# Patient Record
Sex: Female | Born: 1990 | Race: Black or African American | Hispanic: No | Marital: Single | Smoking: Current every day smoker
Health system: Southern US, Community
[De-identification: ages and names within clinical notes are randomized; demographics above are authoritative.]

## PROBLEM LIST (undated history)

## (undated) DIAGNOSIS — F1010 Alcohol abuse, uncomplicated: Secondary | ICD-10-CM

## (undated) DIAGNOSIS — F329 Major depressive disorder, single episode, unspecified: Secondary | ICD-10-CM

## (undated) DIAGNOSIS — F32A Depression, unspecified: Secondary | ICD-10-CM

## (undated) DIAGNOSIS — F1910 Other psychoactive substance abuse, uncomplicated: Secondary | ICD-10-CM

## (undated) DIAGNOSIS — K7290 Hepatic failure, unspecified without coma: Secondary | ICD-10-CM

## (undated) DIAGNOSIS — F319 Bipolar disorder, unspecified: Secondary | ICD-10-CM

## (undated) DIAGNOSIS — Z789 Other specified health status: Secondary | ICD-10-CM

## (undated) DIAGNOSIS — F1020 Alcohol dependence, uncomplicated: Secondary | ICD-10-CM

## (undated) HISTORY — DX: Other psychoactive substance abuse, uncomplicated: F19.10

## (undated) HISTORY — PX: NO PAST SURGERIES: SHX2092

## (undated) HISTORY — DX: Bipolar disorder, unspecified: F31.9

## (undated) HISTORY — DX: Hepatic failure, unspecified without coma: K72.90

## (undated) HISTORY — DX: Alcohol dependence, uncomplicated: F10.20

---
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
## 2010-11-28 LAB — URINALYSIS, ROUTINE W REFLEX MICROSCOPIC
Bilirubin Urine: NEGATIVE
Specific Gravity, Urine: 1.025 (ref 1.005–1.030)
Urobilinogen, UA: 0.2 mg/dL (ref 0.0–1.0)
pH: 7 (ref 5.0–8.0)

## 2010-11-28 LAB — URINE MICROSCOPIC-ADD ON

## 2010-12-04 LAB — RAPID URINE DRUG SCREEN, HOSP PERFORMED
Amphetamines: NOT DETECTED
Opiates: NOT DETECTED

## 2010-12-04 LAB — CBC
MCHC: 33.8 g/dL (ref 30.0–36.0)
MCV: 88 fL (ref 78.0–100.0)
Platelets: 217 10*3/uL (ref 150–400)
RBC: 4 MIL/uL (ref 3.87–5.11)
WBC: 12.5 10*3/uL — ABNORMAL HIGH (ref 4.0–10.5)

## 2010-12-04 LAB — RPR: RPR Ser Ql: NONREACTIVE

## 2011-02-10 ENCOUNTER — Emergency Department (HOSPITAL_COMMUNITY)
Admission: EM | Admit: 2011-02-10 | Discharge: 2011-02-10 | Disposition: A | Discharge status: Home / Self Care | Payer: Medicaid Other | Attending: Emergency Medicine | Admitting: Emergency Medicine

## 2011-02-10 DIAGNOSIS — F329 Major depressive disorder, single episode, unspecified: Secondary | ICD-10-CM | POA: Insufficient documentation

## 2011-02-10 DIAGNOSIS — R51 Headache: Secondary | ICD-10-CM | POA: Insufficient documentation

## 2011-02-10 DIAGNOSIS — F3289 Other specified depressive episodes: Secondary | ICD-10-CM | POA: Insufficient documentation

## 2011-02-10 DIAGNOSIS — H9209 Otalgia, unspecified ear: Secondary | ICD-10-CM | POA: Insufficient documentation

## 2011-02-10 LAB — POCT PREGNANCY, URINE: Preg Test, Ur: NEGATIVE

## 2011-03-05 ENCOUNTER — Inpatient Hospital Stay (INDEPENDENT_AMBULATORY_CARE_PROVIDER_SITE_OTHER)
Admission: RE | Admit: 2011-03-05 | Discharge: 2011-03-05 | Disposition: A | Discharge status: Home / Self Care | Payer: Medicaid Other | Source: Ambulatory Visit | Attending: Family Medicine | Admitting: Family Medicine

## 2011-03-05 DIAGNOSIS — N760 Acute vaginitis: Secondary | ICD-10-CM

## 2011-03-05 LAB — WET PREP, GENITAL

## 2011-03-05 LAB — POCT URINALYSIS DIP (DEVICE)
Glucose, UA: NEGATIVE mg/dL
Hgb urine dipstick: NEGATIVE
Ketones, ur: NEGATIVE mg/dL
Specific Gravity, Urine: >=1.03 (ref 1.005–1.030)
Urobilinogen, UA: 0.2 mg/dL (ref 0.0–1.0)

## 2011-03-05 LAB — POCT PREGNANCY, URINE: Preg Test, Ur: NEGATIVE

## 2011-03-06 LAB — GC/CHLAMYDIA PROBE AMP, GENITAL: GC Probe Amp, Genital: NEGATIVE

## 2011-04-29 ENCOUNTER — Emergency Department (HOSPITAL_COMMUNITY)
Admission: EM | Admit: 2011-04-29 | Discharge: 2011-04-29 | Disposition: A | Discharge status: Home / Self Care | Payer: Medicaid Other | Attending: Emergency Medicine | Admitting: Emergency Medicine

## 2011-04-29 DIAGNOSIS — J3489 Other specified disorders of nose and nasal sinuses: Secondary | ICD-10-CM | POA: Insufficient documentation

## 2011-04-29 DIAGNOSIS — R51 Headache: Secondary | ICD-10-CM | POA: Insufficient documentation

## 2011-04-29 DIAGNOSIS — J329 Chronic sinusitis, unspecified: Secondary | ICD-10-CM | POA: Insufficient documentation

## 2011-04-29 DIAGNOSIS — H9209 Otalgia, unspecified ear: Secondary | ICD-10-CM | POA: Insufficient documentation

## 2011-06-23 ENCOUNTER — Inpatient Hospital Stay (INDEPENDENT_AMBULATORY_CARE_PROVIDER_SITE_OTHER)
Admission: RE | Admit: 2011-06-23 | Discharge: 2011-06-23 | Disposition: A | Discharge status: Home / Self Care | Payer: Medicaid Other | Source: Ambulatory Visit | Attending: Family Medicine | Admitting: Family Medicine

## 2011-06-23 DIAGNOSIS — Z331 Pregnant state, incidental: Secondary | ICD-10-CM

## 2011-06-23 LAB — POCT URINALYSIS DIP (DEVICE)
Leukocytes, UA: NEGATIVE
Nitrite: NEGATIVE
Protein, ur: NEGATIVE mg/dL
Urobilinogen, UA: 0.2 mg/dL (ref 0.0–1.0)
pH: 7 (ref 5.0–8.0)

## 2011-07-16 ENCOUNTER — Inpatient Hospital Stay (HOSPITAL_COMMUNITY): Payer: Medicaid Other

## 2011-07-16 ENCOUNTER — Inpatient Hospital Stay (HOSPITAL_COMMUNITY)
Admission: AD | Admit: 2011-07-16 | Discharge: 2011-07-16 | Disposition: A | Discharge status: Home / Self Care | Payer: Medicaid Other | Source: Ambulatory Visit | Attending: Obstetrics & Gynecology | Admitting: Obstetrics & Gynecology

## 2011-07-16 ENCOUNTER — Encounter (HOSPITAL_COMMUNITY): Payer: Self-pay

## 2011-07-16 DIAGNOSIS — N939 Abnormal uterine and vaginal bleeding, unspecified: Secondary | ICD-10-CM

## 2011-07-16 DIAGNOSIS — N938 Other specified abnormal uterine and vaginal bleeding: Secondary | ICD-10-CM | POA: Insufficient documentation

## 2011-07-16 DIAGNOSIS — N949 Unspecified condition associated with female genital organs and menstrual cycle: Secondary | ICD-10-CM | POA: Insufficient documentation

## 2011-07-16 DIAGNOSIS — N898 Other specified noninflammatory disorders of vagina: Secondary | ICD-10-CM

## 2011-07-16 DIAGNOSIS — N925 Other specified irregular menstruation: Secondary | ICD-10-CM | POA: Insufficient documentation

## 2011-07-16 HISTORY — DX: Other specified health status: Z78.9

## 2011-07-16 LAB — WET PREP, GENITAL: Clue Cells Wet Prep HPF POC: NONE SEEN

## 2011-07-16 LAB — CBC
MCH: 22.3 pg — ABNORMAL LOW (ref 26.0–34.0)
MCHC: 30.7 g/dL (ref 30.0–36.0)
MCV: 72.7 fL — ABNORMAL LOW (ref 78.0–100.0)
Platelets: 403 10*3/uL — ABNORMAL HIGH (ref 150–400)
RBC: 4.39 MIL/uL (ref 3.87–5.11)
RDW: 19.2 % — ABNORMAL HIGH (ref 11.5–15.5)

## 2011-07-16 LAB — HCG, QUANTITATIVE, PREGNANCY: hCG, Beta Chain, Quant, S: 1 m[IU]/mL (ref <5)

## 2011-07-16 LAB — ABO/RH: ABO/RH(D): B POS

## 2011-07-16 MED ORDER — MEDROXYPROGESTERONE ACETATE 10 MG PO TABS: 10.0000 mg | ORAL_TABLET | Freq: Every day | ORAL | Status: DC

## 2011-07-16 NOTE — ED Provider Notes (Signed)
History   Pt presents today c/o vag bleeding "on and off" over the past several months. She states she had a pos preg test at urgent care about 10 days ago. She denies abd pain, vag irritation, or any other sx at this time. She is a very poor historian and states she thinks she also had a pos home preg test about 4 months ago.  Chief Complaint  Patient presents with  . Vaginal Bleeding   HPI  OB History    Grav Para Term Preterm Abortions TAB SAB Ect Mult Living   2 1 1  0 0 0 0 0 0 1      Past Medical History  Diagnosis Date  . No pertinent past medical history     Past Surgical History  Procedure Date  . No past surgeries     History reviewed. No pertinent family history.  History  Substance Use Topics  . Smoking status: Current Some Day Smoker  . Smokeless tobacco: Not on file  . Alcohol Use: No    Allergies: No Known Allergies  No prescriptions prior to admission    Review of Systems  Constitutional: Negative for fever.  Cardiovascular: Negative for chest pain.  Gastrointestinal: Negative for nausea, vomiting, abdominal pain, diarrhea and constipation.  Genitourinary: Negative for dysuria, urgency, frequency and hematuria.  Neurological: Negative for dizziness and headaches.  Psychiatric/Behavioral: Negative for depression and suicidal ideas.   Physical Exam   Blood pressure 124/69, pulse 67, temperature 98.8 F (37.1 C), temperature source Oral, resp. rate 20, height 5\' 6"  (1.676 m), weight 154 lb (69.854 kg), last menstrual period 06/10/2011.  Physical Exam  Nursing note and vitals reviewed. Constitutional: She appears well-developed and well-nourished. No distress.  HENT:  Head: Normocephalic and atraumatic.  Eyes: EOM are normal. Pupils are equal, round, and reactive to light.  GI: Soft. She exhibits no distension. There is no tenderness. There is no rebound and no guarding.  Skin: She is not diaphoretic.    MAU Course  Procedures  Results for  orders placed during the hospital encounter of 07/16/11 (from the past 24 hour(s))  CBC     Status: Abnormal   Collection Time   07/16/11  2:50 PM      Component Value Range   WBC 7.5  4.0 - 10.5 (K/uL)   RBC 4.39  3.87 - 5.11 (MIL/uL)   Hemoglobin 9.8 (*) 12.0 - 15.0 (g/dL)   HCT 16.1 (*) 09.6 - 46.0 (%)   MCV 72.7 (*) 78.0 - 100.0 (fL)   MCH 22.3 (*) 26.0 - 34.0 (pg)   MCHC 30.7  30.0 - 36.0 (g/dL)   RDW 04.5 (*) 40.9 - 15.5 (%)   Platelets 403 (*) 150 - 400 (K/uL)  ABO/RH     Status: Normal   Collection Time   07/16/11  2:50 PM      Component Value Range   ABO/RH(D) B POS    HCG, QUANTITATIVE, PREGNANCY     Status: Normal   Collection Time   07/16/11  2:50 PM      Component Value Range   hCG, Beta Chain, Quant, S 1  <5 (mIU/mL)   US Transvaginal Non-ob  07/16/2011  *RADIOLOGY REPORT*  Clinical Data: Bleeding, positive urine pregnancy test on 06/23/2011; current quantitative beta HCG = 1 (on 07/16/2011)  TRANSABDOMINAL AND TRANSVAGINAL ULTRASOUND OF PELVIS Technique:  Both transabdominal and transvaginal ultrasound examinations of the pelvis were performed. Transabdominal technique was performed for global imaging of the  pelvis including uterus, ovaries, adnexal regions, and pelvic cul-de-sac.  Comparison: None   It was necessary to proceed with endovaginal exam following the transabdominal exam to visualize the endometrium and ovaries.  Findings:  Uterus: 6.1 cm length by 1.8 cm AP by 4.3 cm transverse.  No focal mass.  Endometrium: 5 mm thick, normal.  No endometrial fluid or gestational sac identified.  Right ovary:  3.3 x 1.8 x 2.5 cm.  Small follicle cyst 2.1 cm greatest size.  Left ovary: 2.4 x 1.5 x 2.0 cm.  Normal morphology without mass.  Other findings: Small amount of nonspecific free pelvic fluid.  No adnexal masses otherwise identified.  IMPRESSION:  Small amount of nonspecific free pelvic fluid. Otherwise negative exam.  Original Report Authenticated By: Lollie Marrow, M.D.    US Pelvis Complete  07/16/2011  *RADIOLOGY REPORT*  Clinical Data: Bleeding, positive urine pregnancy test on 06/23/2011; current quantitative beta HCG = 1 (on 07/16/2011)  TRANSABDOMINAL AND TRANSVAGINAL ULTRASOUND OF PELVIS Technique:  Both transabdominal and transvaginal ultrasound examinations of the pelvis were performed. Transabdominal technique was performed for global imaging of the pelvis including uterus, ovaries, adnexal regions, and pelvic cul-de-sac.  Comparison: None   It was necessary to proceed with endovaginal exam following the transabdominal exam to visualize the endometrium and ovaries.  Findings:  Uterus: 6.1 cm length by 1.8 cm AP by 4.3 cm transverse.  No focal mass.  Endometrium: 5 mm thick, normal.  No endometrial fluid or gestational sac identified.  Right ovary:  3.3 x 1.8 x 2.5 cm.  Small follicle cyst 2.1 cm greatest size.  Left ovary: 2.4 x 1.5 x 2.0 cm.  Normal morphology without mass.  Other findings: Small amount of nonspecific free pelvic fluid.  No adnexal masses otherwise identified.  IMPRESSION:  Small amount of nonspecific free pelvic fluid. Otherwise negative exam.  Original Report Authenticated By: Lollie Marrow, M.D.   Wet prep done. Few WBCs noted, otherwise negative.   Assessment and Plan  Irregular vag bleeding: pt is not pregnant. Difficult to determine if she was ever really pregnant or had an early SAB. Will give Rx for provera and have her f/u with her PCP. Discussed diet, activity, risks, and precautions.  Clinton Gallant. Charolett Yarrow III, DrHSc, MPAS, PA-C  07/16/2011, 2:47 PM   Henrietta Hoover, PA 07/16/11 1702

## 2011-07-16 NOTE — Progress Notes (Signed)
Bleeding started was off and on, went to urgent care about 10 days because of bleeding, was told she was preg.

## 2011-07-17 LAB — GC/CHLAMYDIA PROBE AMP, GENITAL: Chlamydia, DNA Probe: NEGATIVE

## 2011-07-31 ENCOUNTER — Encounter (HOSPITAL_COMMUNITY): Payer: Self-pay | Admitting: *Deleted

## 2011-07-31 ENCOUNTER — Emergency Department (HOSPITAL_COMMUNITY)
Admission: EM | Admit: 2011-07-31 | Discharge: 2011-07-31 | Discharge status: Against Medical Advice | Payer: Medicaid Other | Attending: Emergency Medicine | Admitting: Emergency Medicine

## 2011-07-31 DIAGNOSIS — Z0389 Encounter for observation for other suspected diseases and conditions ruled out: Secondary | ICD-10-CM | POA: Insufficient documentation
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
## 2011-07-31 NOTE — ED Notes (Signed)
Name called twice no answer 

## 2011-10-23 ENCOUNTER — Encounter (HOSPITAL_COMMUNITY): Payer: Self-pay | Admitting: *Deleted

## 2011-10-23 ENCOUNTER — Emergency Department (INDEPENDENT_AMBULATORY_CARE_PROVIDER_SITE_OTHER)
Admission: EM | Admit: 2011-10-23 | Discharge: 2011-10-23 | Disposition: A | Discharge status: Home / Self Care | Payer: Medicaid Other | Source: Home / Self Care | Attending: Family Medicine | Admitting: Family Medicine

## 2011-10-23 DIAGNOSIS — Z202 Contact with and (suspected) exposure to infections with a predominantly sexual mode of transmission: Secondary | ICD-10-CM

## 2011-10-23 DIAGNOSIS — Z9189 Other specified personal risk factors, not elsewhere classified: Secondary | ICD-10-CM

## 2011-10-23 LAB — POCT URINALYSIS DIP (DEVICE)
Protein, ur: NEGATIVE mg/dL
Specific Gravity, Urine: 1.015 (ref 1.005–1.030)
Urobilinogen, UA: 0.2 mg/dL (ref 0.0–1.0)
pH: 7 (ref 5.0–8.0)

## 2011-10-23 MED ORDER — METRONIDAZOLE 500 MG PO TABS: 500.0000 mg | ORAL_TABLET | Freq: Two times a day (BID) | ORAL | Status: AC
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
## 2011-10-23 NOTE — Discharge Instructions (Signed)
No intercourse x 10 days. We will contact you with any positive lab results and instructions if indicated.
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
## 2011-10-25 LAB — GC/CHLAMYDIA PROBE AMP, GENITAL: GC Probe Amp, Genital: NEGATIVE

## 2011-10-27 ENCOUNTER — Telehealth (HOSPITAL_COMMUNITY): Payer: Self-pay | Admitting: *Deleted

## 2011-10-27 MED ORDER — AZITHROMYCIN 500 MG PO TABS: 1000.0000 mg | ORAL_TABLET | Freq: Once | ORAL | Status: AC

## 2011-10-27 NOTE — ED Notes (Signed)
GC neg., Chlamydia pos., no wet prep: Pt. Needs tx. with Zithromax 1 gm po x 1 dose. Dr. Artis Flock e-prescribed it to [facility] on [address]. I called and left pt. a message to call.  Gina Hooper 10/27/2011
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
## 2012-02-17 ENCOUNTER — Encounter (HOSPITAL_COMMUNITY): Payer: Self-pay | Admitting: *Deleted

## 2012-02-17 ENCOUNTER — Emergency Department (HOSPITAL_COMMUNITY)
Admission: EM | Admit: 2012-02-17 | Discharge: 2012-02-17 | Disposition: A | Discharge status: Home / Self Care | Payer: Medicaid Other | Attending: Emergency Medicine | Admitting: Emergency Medicine

## 2012-02-17 ENCOUNTER — Emergency Department (HOSPITAL_COMMUNITY): Payer: Medicaid Other

## 2012-02-17 DIAGNOSIS — W503XXA Accidental bite by another person, initial encounter: Secondary | ICD-10-CM

## 2012-02-17 DIAGNOSIS — S0003XA Contusion of scalp, initial encounter: Secondary | ICD-10-CM | POA: Insufficient documentation

## 2012-02-17 DIAGNOSIS — S0083XA Contusion of other part of head, initial encounter: Secondary | ICD-10-CM | POA: Insufficient documentation

## 2012-02-17 DIAGNOSIS — S51809A Unspecified open wound of unspecified forearm, initial encounter: Secondary | ICD-10-CM | POA: Insufficient documentation

## 2012-02-17 DIAGNOSIS — R51 Headache: Secondary | ICD-10-CM | POA: Insufficient documentation

## 2012-02-17 MED ORDER — TETANUS-DIPHTH-ACELL PERTUSSIS 5-2.5-18.5 LF-MCG/0.5 IM SUSP
0.5000 mL | Freq: Once | INTRAMUSCULAR | Status: AC
  Administered 2012-02-17: 0.5 mL via INTRAMUSCULAR
  Filled 2012-02-17: qty 0.5

## 2012-02-17 MED ORDER — AMOXICILLIN-POT CLAVULANATE 875-125 MG PO TABS: 1.0000 | ORAL_TABLET | Freq: Two times a day (BID) | ORAL | Status: AC
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
## 2012-02-17 NOTE — ED Provider Notes (Signed)
Medical screening examination/treatment/procedure(s) were performed by non-physician practitioner and as supervising physician I was immediately available for consultation/collaboration.  Olivia Mackie, MD 02/17/12 [phone]
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
## 2012-02-17 NOTE — ED Notes (Signed)
Contacted CT Scan, states PACS went down and radiologist is working on reading results. Results will show shortly.

## 2012-02-18 NOTE — ED Provider Notes (Signed)
Medical screening examination/treatment/procedure(s) were performed by non-physician practitioner and as supervising physician I was immediately available for consultation/collaboration.  Olivia Mackie, MD 02/18/12 [phone]

## 2012-06-17 IMAGING — US US TRANSVAGINAL NON-OB
1 series · 14 of 25 positions shown · non-contrast
Comparison: None

CLINICAL DATA: Bleeding, positive urine pregnancy test on
06/23/2011; current quantitative beta HCG = 1 (on 07/16/2011)

TRANSABDOMINAL AND TRANSVAGINAL ULTRASOUND OF PELVIS
TECHNIQUE: Both transabdominal and transvaginal ultrasound
examinations of the pelvis were performed. Transabdominal technique
was performed for global imaging of the pelvis including uterus,
ovaries, adnexal regions, and pelvic cul-de-sac.

[Series 1: us ob comp less 14 wks · 14 of 75 slices shown]
[im 1/75]
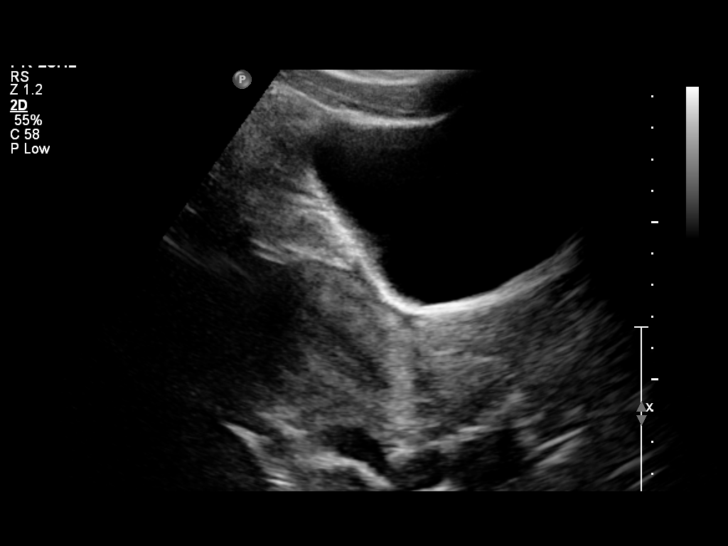
[im 7/75]
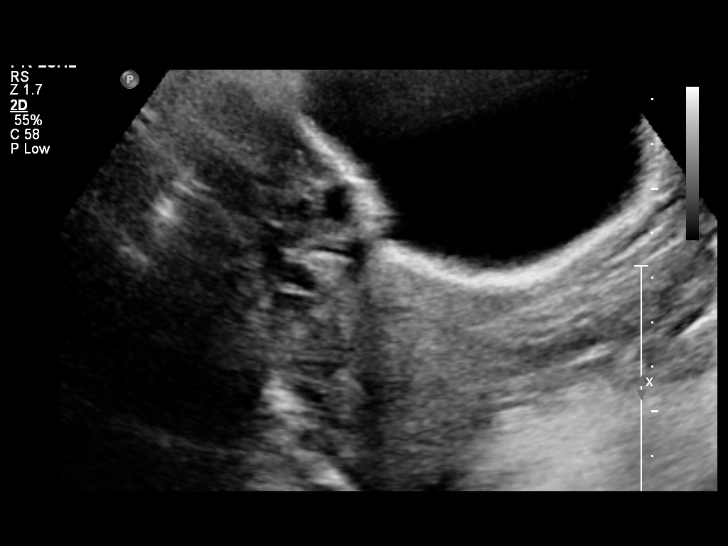
[im 13/75]
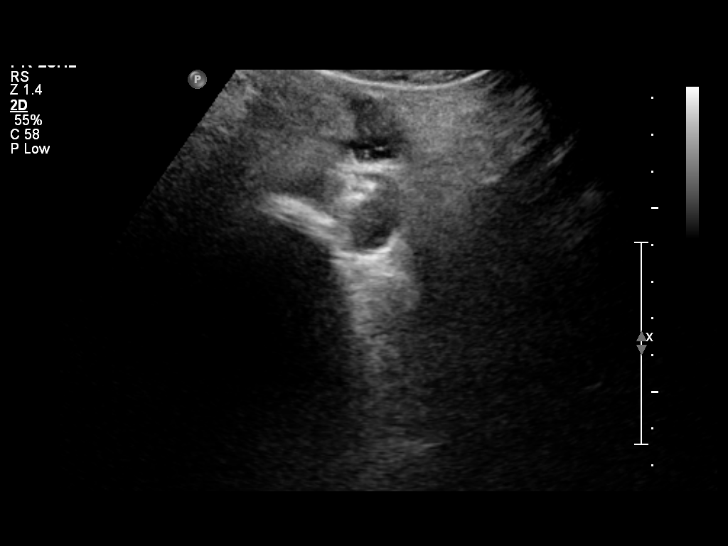
[im 19/75]
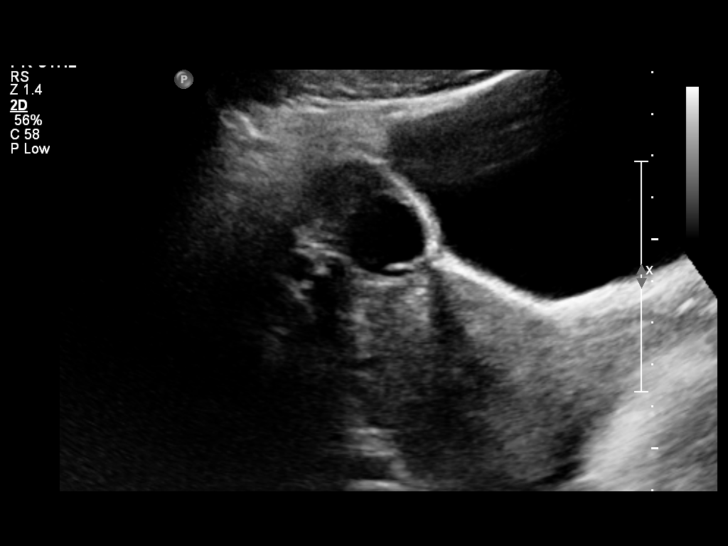
[im 25/75]
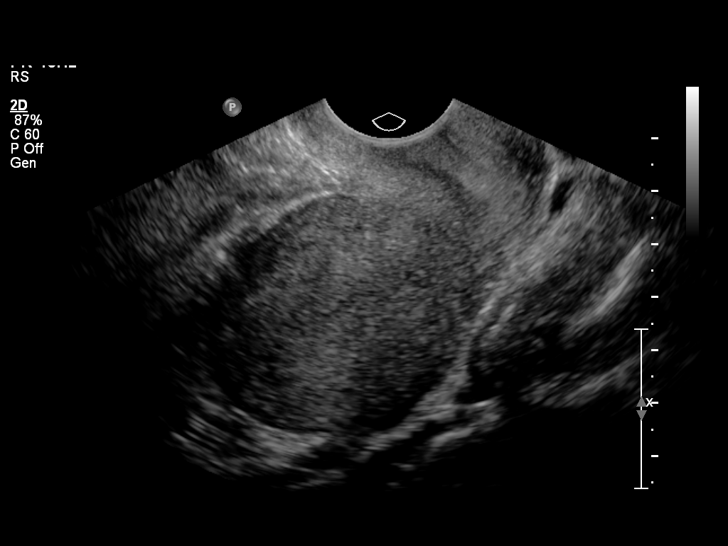
[im 28/75]
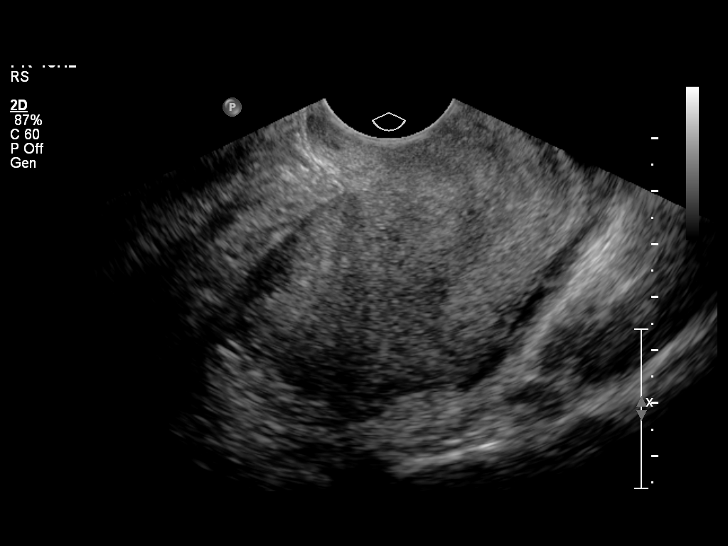
[im 34/75]
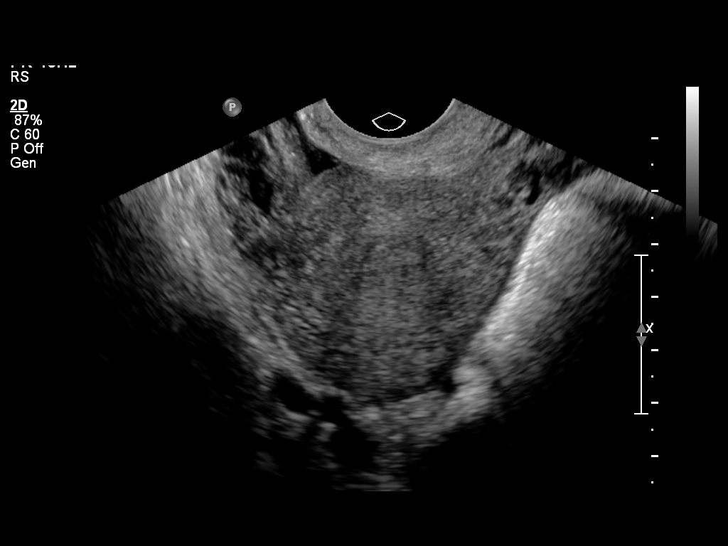
[im 41/75]
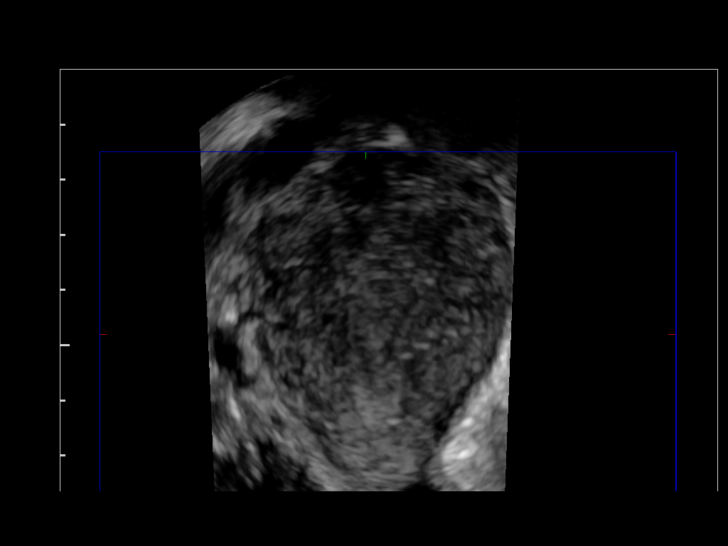
[im 47/75]
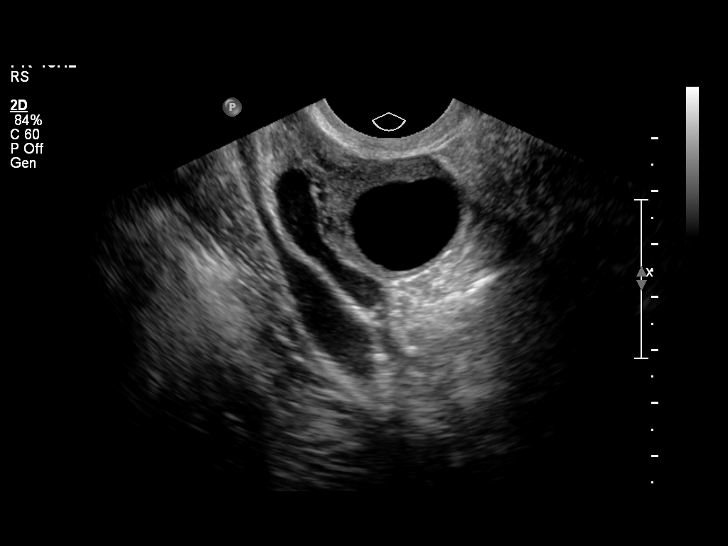
[im 50/75]
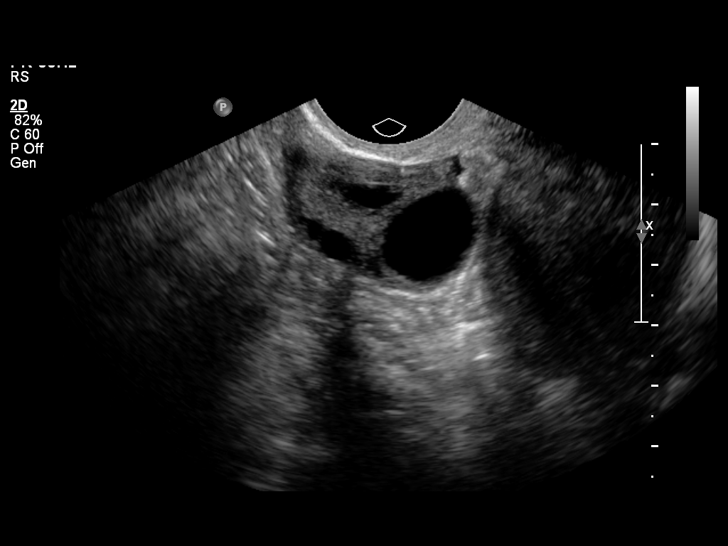
[im 56/75]
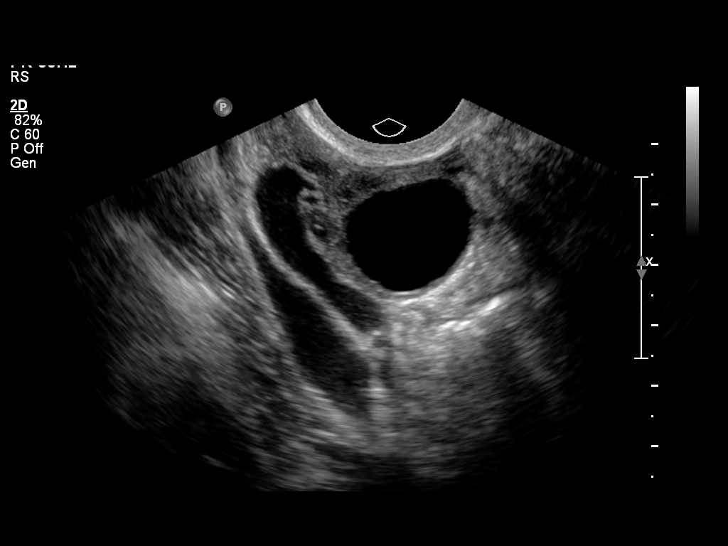
[im 62/75]
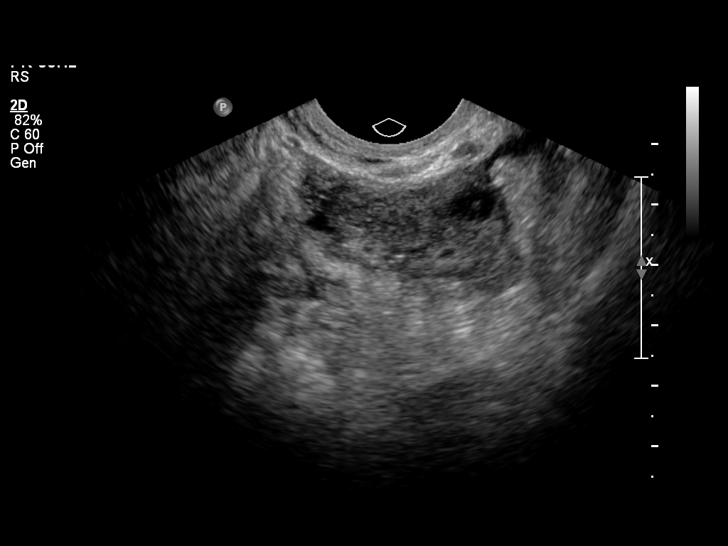
[im 68/75]
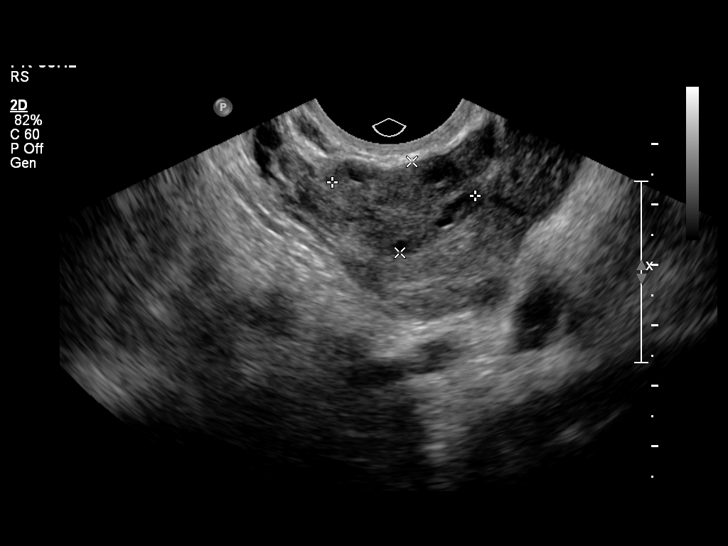
[im 75/75]
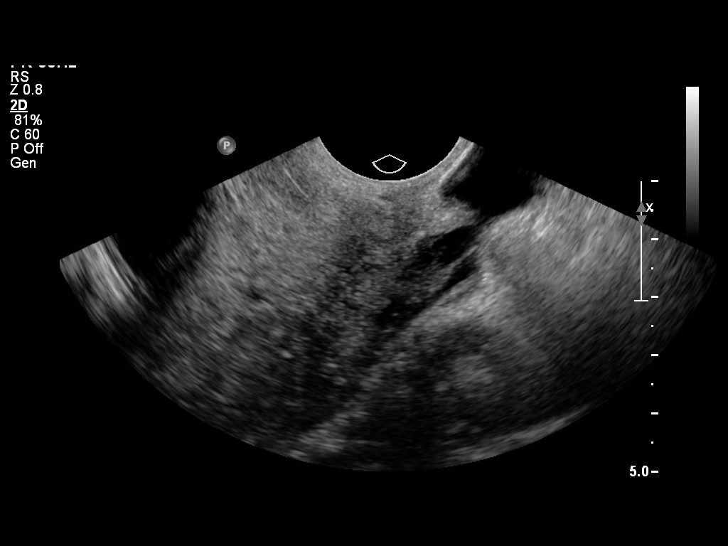

[14 of 25 positions shown; findings below may reference images not displayed]

It was necessary to proceed with endovaginal exam following the
transabdominal exam to visualize the endometrium and ovaries.
FINDINGS: Uterus: 6.1 cm length by 1.8 cm AP by 4.3 cm transverse.  No focal
mass.

Endometrium: 5 mm thick, normal.  No endometrial fluid or
gestational sac identified.

Right ovary:  3.3 x 1.8 x 2.5 cm.  Small follicle cyst 2.1 cm
greatest size.

Left ovary: 2.4 x 1.5 x 2.0 cm.  Normal morphology without mass.

Other findings: Small amount of nonspecific free pelvic fluid.  No
adnexal masses otherwise identified.
IMPRESSION: Small amount of nonspecific free pelvic fluid.
Otherwise negative exam.

## 2013-05-22 ENCOUNTER — Emergency Department (HOSPITAL_COMMUNITY): Payer: Medicaid Other

## 2013-05-22 ENCOUNTER — Encounter (HOSPITAL_COMMUNITY): Payer: Self-pay | Admitting: *Deleted

## 2013-05-22 ENCOUNTER — Telehealth (HOSPITAL_COMMUNITY): Payer: Self-pay | Admitting: *Deleted

## 2013-05-22 ENCOUNTER — Emergency Department (HOSPITAL_COMMUNITY)
Admission: EM | Admit: 2013-05-22 | Discharge: 2013-05-22 | Disposition: A | Discharge status: Home / Self Care | Payer: Medicaid Other | Attending: Emergency Medicine | Admitting: Emergency Medicine

## 2013-05-22 DIAGNOSIS — Y939 Activity, unspecified: Secondary | ICD-10-CM | POA: Insufficient documentation

## 2013-05-22 DIAGNOSIS — F17200 Nicotine dependence, unspecified, uncomplicated: Secondary | ICD-10-CM | POA: Insufficient documentation

## 2013-05-22 DIAGNOSIS — IMO0002 Reserved for concepts with insufficient information to code with codable children: Secondary | ICD-10-CM | POA: Insufficient documentation

## 2013-05-22 DIAGNOSIS — S8990XA Unspecified injury of unspecified lower leg, initial encounter: Secondary | ICD-10-CM | POA: Insufficient documentation

## 2013-05-22 DIAGNOSIS — Y92410 Unspecified street and highway as the place of occurrence of the external cause: Secondary | ICD-10-CM | POA: Insufficient documentation

## 2013-05-22 DIAGNOSIS — M79672 Pain in left foot: Secondary | ICD-10-CM

## 2013-05-22 DIAGNOSIS — Z8679 Personal history of other diseases of the circulatory system: Secondary | ICD-10-CM | POA: Insufficient documentation
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
## 2013-06-16 ENCOUNTER — Inpatient Hospital Stay (HOSPITAL_COMMUNITY)
Admission: AD | Admit: 2013-06-16 | Discharge: 2013-06-16 | Disposition: A | Discharge status: Home / Self Care | Payer: Medicaid Other | Source: Ambulatory Visit | Attending: Obstetrics & Gynecology | Admitting: Obstetrics & Gynecology

## 2013-06-16 ENCOUNTER — Encounter (HOSPITAL_COMMUNITY): Payer: Self-pay | Admitting: *Deleted

## 2013-06-16 DIAGNOSIS — F17200 Nicotine dependence, unspecified, uncomplicated: Secondary | ICD-10-CM | POA: Insufficient documentation

## 2013-06-16 DIAGNOSIS — N938 Other specified abnormal uterine and vaginal bleeding: Secondary | ICD-10-CM

## 2013-06-16 DIAGNOSIS — N949 Unspecified condition associated with female genital organs and menstrual cycle: Secondary | ICD-10-CM

## 2013-06-16 DIAGNOSIS — N925 Other specified irregular menstruation: Secondary | ICD-10-CM

## 2013-06-16 DIAGNOSIS — N926 Irregular menstruation, unspecified: Secondary | ICD-10-CM | POA: Insufficient documentation

## 2013-06-16 MED ORDER — MEDROXYPROGESTERONE ACETATE 10 MG PO TABS: 10.0000 mg | ORAL_TABLET | Freq: Every day | ORAL | Status: DC

## 2013-06-16 NOTE — MAU Note (Signed)
3 periods in one month.  Only time of irregular bleeding was a miscarriage.

## 2013-06-16 NOTE — MAU Provider Note (Signed)
History     CSN: [id number]  Arrival date and time: 06/16/13 7829   None     Chief Complaint  Patient presents with  . Vaginal Bleeding   HPI  Gina Hooper is a 22y/o G2P1011 who presents today c/o increased periods this month. She has no hx of any gynecologic problems or abnormal pap smears.  She has not had routine care in a few years; she used to go to the [facility] for routine care but stopped going.  She is interested in a referral to the [facility] to establish routine care again. She has NKDA and is not taking any medications other than Advil prn.  She is a current everyday smoker, 0.25 packs per day for 5 years, and uses moderate alcohol. She denies exposures to STIs and does not think she is pregnant, but she is sexually active. She uses condoms for birth control. UPT (-) today.  Her menses are usually regular, every 28d, with moderate flow lasting for 4 days. LMP began on 10/7 and was normal for her, but 2 days after it terminated, she began bleeding again.  She also reports that the bleeding was heavier than normal, but was without clots.  The episode lasted for her typical 4 days and then ceased.  2 days later vaginal bleeding began for the third time.  She is currently on the 3rd day of vaginal bleeding, but she states that it is improving, similar to her previous episode.  With the heavy bleeding she was using 2 tampons per hour, which is abnormal for her.  Currently, she is using 1 tampon every 3-4 hours. Gina Hooper reports that she has never had anything like this before.  Her only other report of increased vaginal bleeding was in 2013 when she had a miscarriage and did not know she was pregnant. Gina Hooper does report mild fatigue and lightheadedness over the past few days, but she denies HA, CP, SOB, N/V, diarrhea, constipation, abdominal pain, dysuria, vaginal discharge other than blood, vaginal burning/itching, dyspareunia, fever or chills. She has had some mild cramping  that she describes as her "normal period cramps" for which she takes Advil prn.   Pertinent Gynecological History: Menses: flow is moderate Bleeding: regular Contraception: condoms DES exposure: unknown Blood transfusions: none Sexually transmitted diseases: no past history   Past Medical History  Diagnosis Date  . No pertinent past medical history   . Migraine     Past Surgical History  Procedure Laterality Date  . No past surgeries      History reviewed. No pertinent family history.  History  Substance Use Topics  . Smoking status: Current Some Day Smoker -- 0.25 packs/day for 5 years    Types: Cigarettes  . Smokeless tobacco: Never Used  . Alcohol Use: Yes    Allergies: No Known Allergies  No prescriptions prior to admission    Review of Systems  Constitutional: Positive for malaise/fatigue. Negative for fever and chills.  Eyes: Negative for blurred vision.  Respiratory: Negative for shortness of breath.   Cardiovascular: Negative for chest pain.  Gastrointestinal: Negative for heartburn, nausea, vomiting, abdominal pain, diarrhea and constipation.  Genitourinary: Negative for dysuria.  Neurological: Positive for dizziness. Negative for headaches.       Lightheadedness    Physical Exam   Blood pressure 123/78, pulse 74, temperature 98.8 F (37.1 C), temperature source Oral, resp. rate 18, height 5' 5.5" (1.664 m), weight 68.04 kg (150 lb), last menstrual period 06/14/2013.  Physical Exam  Constitutional: She is oriented to person, place, and time. She appears well-developed and well-nourished. No distress.  HENT:  Head: Normocephalic.  Eyes: Conjunctivae are normal.  Neck: Neck supple.  Cardiovascular: Normal rate and regular rhythm.  Exam reveals no gallop and no friction rub.   No murmur heard. Respiratory: Effort normal and breath sounds normal. She has no wheezes.  GI: Soft. Bowel sounds are normal. She exhibits no distension and no mass. There is  no tenderness. There is no rebound and no guarding.  Genitourinary: Vaginal discharge found.  Pelvic exam: normal external genitalia, vulva, vagina, cervix, uterus and adnexa, VULVA: normal appearing vulva with no masses, tenderness or lesions, VAGINA: vaginal discharge - small to moderate bloody discharge, CERVIX: normal appearing cervix with bloody discharge, no lesions, UTERUS: uterus is normal size, shape, consistency and nontender, ADNEXA: normal adnexa in size, nontender and no masses.   Neurological: She is alert and oriented to person, place, and time.  Psychiatric: She has a normal mood and affect.    Labs: Results for orders placed during the hospital encounter of 06/16/13 (from the past 24 hour(s))  POCT PREGNANCY, URINE     Status: None   Collection Time    06/16/13  9:29 AM      Result Value Range   Preg Test, Ur NEGATIVE  NEGATIVE    MAU Course  Procedures  MDM Pelvic exam w/ speculum  Offered CBC, patient declines. Will repeat if this becomes a chronic problem.  Assessment and Plan  A: Suspect anovulatory bleeding   P:  Rx: Provera 10mg  x 10 days  Return for worsening sx or bleeding Referral to [facility] to establish routine care.  Contraception: discussed contraceptive options; pt is not actively trying to become pregnant, but declined any prevention.   Christiana Fuchs 06/16/2013, 9:29 AM   Seen and examined by me also Agree with note Aviva Signs, CNM

## 2013-06-16 NOTE — MAU Note (Signed)
Patient presents with complaint that she has "came on my period 3 times this month."

## 2013-06-21 NOTE — MAU Provider Note (Signed)
Attestation of Attending Supervision of Advanced Practitioner (CNM/NP): Evaluation and management procedures were performed by the Advanced Practitioner under my supervision and collaboration. I have reviewed the Advanced Practitioner's note and chart, and I agree with the management and plan.  Mikylah Ackroyd H. 7:07 AM

## 2013-07-28 ENCOUNTER — Encounter: Payer: Medicaid Other | Admitting: Obstetrics & Gynecology

## 2013-08-25 DIAGNOSIS — F1010 Alcohol abuse, uncomplicated: Secondary | ICD-10-CM

## 2013-08-25 HISTORY — DX: Alcohol abuse, uncomplicated: F10.10

## 2013-09-30 ENCOUNTER — Emergency Department (HOSPITAL_COMMUNITY)
Admission: EM | Admit: 2013-09-30 | Discharge: 2013-09-30 | Disposition: A | Discharge status: Home / Self Care | Payer: Medicaid Other | Attending: Emergency Medicine | Admitting: Emergency Medicine

## 2013-09-30 ENCOUNTER — Encounter (HOSPITAL_COMMUNITY): Payer: Self-pay | Admitting: Emergency Medicine

## 2013-09-30 DIAGNOSIS — B86 Scabies: Secondary | ICD-10-CM | POA: Insufficient documentation

## 2013-09-30 DIAGNOSIS — Z79899 Other long term (current) drug therapy: Secondary | ICD-10-CM | POA: Insufficient documentation

## 2013-09-30 DIAGNOSIS — Z8679 Personal history of other diseases of the circulatory system: Secondary | ICD-10-CM | POA: Insufficient documentation

## 2013-09-30 DIAGNOSIS — F17200 Nicotine dependence, unspecified, uncomplicated: Secondary | ICD-10-CM | POA: Insufficient documentation

## 2013-09-30 MED ORDER — LINDANE 1 % EX LOTN: 1.0000 "application " | TOPICAL_LOTION | Freq: Once | CUTANEOUS | Status: DC
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
## 2013-09-30 NOTE — ED Provider Notes (Signed)
Medical screening examination/treatment/procedure(s) were performed by non-physician practitioner and as supervising physician I was immediately available for consultation/collaboration.  EKG Interpretation   None         Dagmar Hait, MD 09/30/13 [phone]
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
## 2013-09-30 NOTE — Discharge Instructions (Signed)
Return here as needed.  Followup with an urgent care. Reapply in 2 weeks. Buy Nix spray for your bed and furniture

## 2014-02-08 ENCOUNTER — Emergency Department (HOSPITAL_COMMUNITY)
Admission: EM | Admit: 2014-02-08 | Discharge: 2014-02-08 | Disposition: A | Discharge status: Home / Self Care | Payer: Medicaid Other | Attending: Emergency Medicine | Admitting: Emergency Medicine

## 2014-02-08 ENCOUNTER — Emergency Department (HOSPITAL_COMMUNITY): Payer: Medicaid Other

## 2014-02-08 ENCOUNTER — Encounter (HOSPITAL_COMMUNITY): Payer: Self-pay | Admitting: Emergency Medicine

## 2014-02-08 DIAGNOSIS — S0501XA Injury of conjunctiva and corneal abrasion without foreign body, right eye, initial encounter: Secondary | ICD-10-CM

## 2014-02-08 DIAGNOSIS — S0011XA Contusion of right eyelid and periocular area, initial encounter: Secondary | ICD-10-CM

## 2014-02-08 DIAGNOSIS — Z8679 Personal history of other diseases of the circulatory system: Secondary | ICD-10-CM | POA: Insufficient documentation

## 2014-02-08 DIAGNOSIS — IMO0002 Reserved for concepts with insufficient information to code with codable children: Secondary | ICD-10-CM | POA: Insufficient documentation

## 2014-02-08 DIAGNOSIS — F17200 Nicotine dependence, unspecified, uncomplicated: Secondary | ICD-10-CM | POA: Insufficient documentation

## 2014-02-08 DIAGNOSIS — S0010XA Contusion of unspecified eyelid and periocular area, initial encounter: Secondary | ICD-10-CM | POA: Insufficient documentation

## 2014-02-08 MED ORDER — TETRACAINE HCL 0.5 % OP SOLN
2.0000 [drp] | Freq: Once | OPHTHALMIC | Status: AC
  Administered 2014-02-08: 2 [drp] via OPHTHALMIC
  Filled 2014-02-08: qty 2

## 2014-02-08 MED ORDER — HYDROCODONE-ACETAMINOPHEN 5-325 MG PO TABS: 1.0000 | ORAL_TABLET | Freq: Four times a day (QID) | ORAL | Status: DC | PRN

## 2014-02-08 MED ORDER — ERYTHROMYCIN 5 MG/GM OP OINT
TOPICAL_OINTMENT | Freq: Once | OPHTHALMIC | Status: AC
  Administered 2014-02-08: 15:00:00 via OPHTHALMIC
  Filled 2014-02-08: qty 3.5

## 2014-02-08 MED ORDER — FLUORESCEIN SODIUM 1 MG OP STRP
1.0000 | ORAL_STRIP | Freq: Once | OPHTHALMIC | Status: AC
  Administered 2014-02-08: 1 via OPHTHALMIC
  Filled 2014-02-08: qty 1

## 2014-02-08 MED ORDER — ERYTHROMYCIN 5 MG/GM OP OINT: TOPICAL_OINTMENT | OPHTHALMIC | Status: DC

## 2014-02-08 NOTE — ED Notes (Signed)
Dr. Gwendolyn Grant has just examined her eye, and is currently speaking with our on-call ophthalmologist.  She remains in no distress.

## 2014-02-08 NOTE — Discharge Instructions (Signed)
Corneal Abrasion The cornea is the clear covering at the front and center of the eye. When looking at the colored portion of the eye (iris), you are looking through the cornea. This very thin tissue is made up of many layers. The surface layer is a single layer of cells (corneal epithelium) and is one of the most sensitive tissues in the body. If a scratch or injury causes the corneal epithelium to come off, it is called a corneal abrasion. If the injury extends to the tissues below the epithelium, the condition is called a corneal ulcer. CAUSES   Scratches.  Trauma.  Foreign body in the eye. Some people have recurrences of abrasions in the area of the original injury even after it has healed (recurrent erosion syndrome). Recurrent erosion syndrome generally improves and goes away with time. SYMPTOMS   Eye pain.  Difficulty or inability to keep the injured eye open.  The eye becomes very sensitive to light.  Recurrent erosions tend to happen suddenly, first thing in the morning, usually after waking up and opening the eye. DIAGNOSIS  Your health care provider can diagnose a corneal abrasion during an eye exam. Dye is usually placed in the eye using a drop or a small paper strip moistened by your tears. When the eye is examined with a special light, the abrasion shows up clearly because of the dye. TREATMENT   Small abrasions may be treated with antibiotic drops or ointment alone.  A pressure patch may be put over the eye. If this is done, follow your doctor's instructions for when to remove the patch. Do not drive or use machines while the eye patch is on. Judging distances is hard to do with a patch on. If the abrasion becomes infected and spreads to the deeper tissues of the cornea, a corneal ulcer can result. This is serious because it can cause corneal scarring. Corneal scars interfere with light passing through the cornea and cause a loss of vision in the involved eye. HOME CARE  INSTRUCTIONS  Use medicine or ointment as directed. Only take over-the-counter or prescription medicines for pain, discomfort, or fever as directed by your health care provider.  Do not drive or operate machinery if your eye is patched. Your ability to judge distances is impaired.  If your health care provider has given you a follow-up appointment, it is very important to keep that appointment. Not keeping the appointment could result in a severe eye infection or permanent loss of vision. If there is any problem keeping the appointment, let your health care provider know. SEEK MEDICAL CARE IF:   You have pain, light sensitivity, and a scratchy feeling in one eye or both eyes.  Your pressure patch keeps loosening up, and you can blink your eye under the patch after treatment.  Any kind of discharge develops from the eye after treatment or if the lids stick together in the morning.  You have the same symptoms in the morning as you did with the original abrasion days, weeks, or months after the abrasion healed. MAKE SURE YOU:   Understand these instructions.  Will watch your condition.  Will get help right away if you are not doing well or get worse. Document Released: 08/08/2000 Document Revised: 08/16/2013 Document Reviewed: 04/18/2013 [facility] Patient Information 2015 [place], [place]. This information is not intended to replace advice given to you by your health care provider. Make sure you discuss any questions you have with your health care provider.  Eye Contusion  An eye contusion is a deep bruise of the eye. This is often called a "black eye." Contusions are the result of an injury that caused bleeding under the skin. The contusion may turn blue, purple, or yellow. Minor injuries will give you a painless contusion, but more severe contusions may stay painful and swollen for a few weeks. If the eye contusion only involves the eyelids and tissues around the eye, the injured area will get  better within a few days to weeks. However, eye contusions can be serious and affect the eyeball and sight. CAUSES   Blunt injury or trauma to the face or eye area.  A forehead injury that causes the blood under the skin to work its way down to the eyelids.  Rubbing the eyes due to irritation. SYMPTOMS   Swelling and redness around the eye.  Bruising around the eye.  Tenderness, soreness, or pain around the eye.  Blurry vision.  Tearing.  Eyeball redness. DIAGNOSIS  A diagnosis is usually based on a thorough exam of the eye and surrounding area. The eye must be looked at carefully to make sure it is not injured and to make sure nothing else will threaten your vision. A vision test may be done. An X-ray or computed tomography (CT) scan may be needed to determine if there are any associated injuries, such as broken bones (fractures). TREATMENT  If there is an injury to the eye, treatment will be determined by the nature of the injury. HOME CARE INSTRUCTIONS   Put ice on the injured area.  Put ice in a plastic bag.  Place a towel between your skin and the bag.  Leave the ice on for 15-20 minutes, 03-04 times a day.  If it is determined that there is no injury to the eye, you may continue normal activities.  Sunglasses may be worn to protect your eyes from bright light if light is uncomfortable.  Sleep with your head elevated. You can put an extra pillow under your head. This may help with discomfort.  Only take over-the-counter or prescription medicines for pain, discomfort, or fever as directed by your caregiver. Do not take aspirin for the first few days. This may increase bruising. SEEK IMMEDIATE MEDICAL CARE IF:   You have any form of vision loss.  You have double vision.  You feel nauseous.  You feel dizzy, sleepy, or like you will faint.  You have any fluid discharge from the eye or your nose.  You have swelling and discoloration that does not fade. MAKE SURE  YOU:   Understand these instructions.  Will watch your condition.  Will get help right away if you are not doing well or get worse. Document Released: 08/08/2000 Document Revised: 11/03/2011 Document Reviewed: 06/27/2011 [facility] Patient Information 2015 [place]. This information is not intended to replace advice given to you by your health care provider. Make sure you discuss any questions you have with your health care provider.
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
## 2014-02-08 NOTE — Progress Notes (Signed)
[facility] provided pt with a list of primary care resources to help patient establish primary care.  °
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
## 2014-06-26 ENCOUNTER — Emergency Department (HOSPITAL_COMMUNITY): Payer: Medicaid Other

## 2014-06-26 ENCOUNTER — Encounter (HOSPITAL_COMMUNITY): Payer: Self-pay | Admitting: Emergency Medicine

## 2014-06-26 ENCOUNTER — Emergency Department (HOSPITAL_COMMUNITY)
Admission: EM | Admit: 2014-06-26 | Discharge: 2014-06-26 | Disposition: A | Discharge status: Home / Self Care | Payer: Medicaid Other | Attending: Emergency Medicine | Admitting: Emergency Medicine

## 2014-06-26 DIAGNOSIS — Z720 Tobacco use: Secondary | ICD-10-CM | POA: Insufficient documentation

## 2014-06-26 DIAGNOSIS — R05 Cough: Secondary | ICD-10-CM

## 2014-06-26 DIAGNOSIS — J40 Bronchitis, not specified as acute or chronic: Secondary | ICD-10-CM | POA: Insufficient documentation

## 2014-06-26 DIAGNOSIS — Z8679 Personal history of other diseases of the circulatory system: Secondary | ICD-10-CM | POA: Insufficient documentation

## 2014-06-26 DIAGNOSIS — R058 Other specified cough: Secondary | ICD-10-CM

## 2014-06-26 MED ORDER — PREDNISONE 20 MG PO TABS
40.0000 mg | ORAL_TABLET | Freq: Once | ORAL | Status: AC
  Administered 2014-06-26: 40 mg via ORAL
  Filled 2014-06-26: qty 2

## 2014-06-26 MED ORDER — PREDNISONE 20 MG PO TABS: 40.0000 mg | ORAL_TABLET | Freq: Every day | ORAL | Status: DC

## 2014-06-26 MED ORDER — ALBUTEROL SULFATE HFA 108 (90 BASE) MCG/ACT IN AERS: 2.0000 | INHALATION_SPRAY | RESPIRATORY_TRACT | Status: DC | PRN

## 2014-06-26 MED ORDER — ALBUTEROL SULFATE (2.5 MG/3ML) 0.083% IN NEBU
5.0000 mg | INHALATION_SOLUTION | Freq: Once | RESPIRATORY_TRACT | Status: AC
  Administered 2014-06-26: 5 mg via RESPIRATORY_TRACT
  Filled 2014-06-26: qty 6

## 2014-06-26 MED ORDER — IPRATROPIUM BROMIDE 0.02 % IN SOLN
0.5000 mg | Freq: Once | RESPIRATORY_TRACT | Status: AC
  Administered 2014-06-26: 0.5 mg via RESPIRATORY_TRACT
  Filled 2014-06-26: qty 2.5
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
## 2014-06-26 NOTE — Discharge Instructions (Signed)
Please follow directions provided. It is important to establish care or follow-up with a primary care provider to ensure your symptoms are improving. You may use the albuterol inhaler 2 puffs every 4 hours as needed for cough or shortness of breath. Take the prednisone daily x 5 days to help with inflammation. Do not hesitate to return for any new, worsening, or concerning symptoms.  SEEK IMMEDIATE MEDICAL CARE IF:  You develop an increased fever or chills.  You have chest pain.  You have severe shortness of breath.  You have bloody sputum.  You develop dehydration.  You faint or repeatedly feel like you are going to pass out.  You develop repeated vomiting.  You develop a severe headache.   Emergency Department Resource Guide 1) Find a Doctor and Pay Out of Pocket Although you won't have to find out who is covered by your insurance plan, it is a good idea to ask around and get recommendations. You will then need to call the office and see if the doctor you have chosen will accept you as a new patient and what types of options they offer for patients who are self-pay. Some doctors offer discounts or will set up payment plans for their patients who do not have insurance, but you will need to ask so you aren't surprised when you get to your appointment.  2) Contact Your Local Health Department Not all health departments have doctors that can see patients for sick visits, but many do, so it is worth a call to see if yours does. If you don't know where your local health department is, you can check in your phone book. The CDC also has a tool to help you locate your state's health department, and many state websites also have listings of all of their local health departments.  3) Find a Walk-in Clinic If your illness is not likely to be very severe or complicated, you may want to try a walk in clinic. These are popping up all over the country in pharmacies, drugstores, and shopping centers. They're  usually staffed by nurse practitioners or physician assistants that have been trained to treat common illnesses and complaints. They're usually fairly quick and inexpensive. However, if you have serious medical issues or chronic medical problems, these are probably not your best option.  No Primary Care Doctor: - Call Health Connect at  [phone] - they can help you locate a primary care doctor that  accepts your insurance, provides certain services, etc. - Physician Referral Service- [phone]  Chronic Pain Problems: Organization         Address  Phone   Notes  [facility]  [phone] Patients need to be referred by their primary care doctor.   Medication Assistance: Organization         Address  Phone   Notes  [facility] Medication [facility] [address] [place] [place], [place] [address] [phone] --Must be a resident of [facility] -- Must have NO insurance coverage whatsoever (no Medicaid/ Medicare, etc.) -- The pt. MUST have a primary care doctor that directs their care regularly and follows them in the community   [facility]  [phone]   [employer]  [phone]    Agencies that provide inexpensive medical care: Organization         Address  Phone   Notes  [facility]  [phone]   [facility]    [phone]   [facility] [address] [place], [place] [address] [phone]   [facility] [address] [place] [place] [phone]   Planned Parenthood    [phone]   [facility]    [phone]   Community Health and [facility]  [address], [place] Phone:  [phone], Fax:  [phone] Hours of Operation:  9 am - 6 pm, M-F.  Also accepts Medicaid/Medicare and self-pay.  [facility]  [address], [place] Phone: [phone], Fax: [phone]. Hours  of Operation:  8:30 am - 5:30 pm, M-F.  Also accepts Medicaid and self-pay.  [facility] [place] [address] [place] Phone: [phone]   [facility] [address] Natasha Bence [place], [place] [phone], Ext. [phone] Mondays & Thursdays: 7-9 AM.  First 15 patients are seen on a first come, first serve basis.    Medicaid-accepting [facility] Providers:  Organization         Address  Phone   Notes  [facility] [address], [place] [phone] Also accepts self-pay patients.  [facility] [address] Laurell Josephs [place], [place]  [phone]   [facility] [address], [place] [phone]   [facility] [address] [place] [phone]   Renaye Rakers [address], [place]   [phone] Only accepts [place] patients after they have their name applied to their card.   Self-Pay (no insurance) in [facility]:  Organization         Address  Phone   Notes  Sickle Cell Patients, [facility] [address] [place], [place] [phone]   [facility] Care [address] [place], [place] [phone]   [facility] [place]  [address], [place] [phone]   [facility]/Dr. Osei-Bonsu  [address] [place] or [address], [place] [phone] Phone number for both [place] and [place] locations is the same.  [facility]gent Medical and [facility] [address] [place] [phone]   [facility] [address] [place] or [address] [phone]   [facility] [address] [place] [phone], phone; [phone], fax Sees patients 1st and 3rd Saturday of every month.  Must not qualify for public or private insurance (i.e. Medicaid,  Medicare, [place] Health Choice, Veterans' Benefits)  Household income should be no more than 200% of the poverty level The clinic cannot treat you if you are pregnant or think you are pregnant  Sexually transmitted diseases are not treated at the clinic.    Dental Care: Organization         Address  Phone  Notes  [facility] Department of [facility] [address] [place], [place] [phone] Accepts children up to age 81 who are enrolled in [place] or [place] Health Choice; pregnant women with a Medicaid card; and children who have applied for Medicaid or [place] Health Choice, but were declined, whose parents can pay a reduced fee at time of service.  [facility] Department of [facility]  [address], [place] [phone] Accepts children up to  age 23 who are enrolled in Medicaid or [place] Health Choice; pregnant women with a Medicaid card; and children who have applied for Medicaid or [place] Health Choice, but were declined, whose parents can pay a reduced fee at time of service.  [facility]  [address], [place] [phone] Patients are seen by appointment only. Walk-ins are not accepted. [facility] will see patients 23 years of age and older. Monday - Tuesday (8am-5pm) Most Wednesdays (8:30-5pm) $30 per visit, cash only  [facility]  [address], [facility] [phone] Patients are seen by appointment only. Walk-ins are not accepted. [facility] will see patients 23 years of age and older. One Wednesday Evening (Monthly: Volunteer Based).  $30 per visit, cash only  [facility]  [phone] for adults; Children under age 264, call Graduate Pediatric Dentistry at [phone]. Children aged 644-14, please call [phone] to request a pediatric application.  Dental services are provided in all areas of dental care including fillings, crowns  and bridges, complete and partial dentures, implants, gum treatment, root canals, and extractions. Preventive care is also provided. Treatment is provided to both adults and children. Patients are selected via a lottery and there is often a waiting list.   [facility] [address], [place]  [phone] [email or web address]   [facility] [address], [place], [place] [phone], Ext. [phone] Second and Fourth Thursday of each month, opens at 6:30 AM; Clinic ends at 9 AM.  Patients are seen on a first-come first-served basis, and a limited number are seen during each clinic.   [facility]  [address], [place], [place] [phone]   Eligibility Requirements You must have lived in [place], [place], or [place] counties for at least the last three months.   You cannot be eligible for state or federal sponsored National Cityhealthcare insurance, including CIGNAVeterans Administration, [place]icaid, or [employer]icare.   You generally cannot be eligible for healthcare insurance through your employer.    How to apply: Eligibility screenings are held every Tuesday and Wednesday afternoon from 1:00 pm until 4:00 pm. You do not need an appointment for the interview!  [facility] [address], [place], [place] [phone]   [facility]  [phone]   [facility]  [phone]   [facility]  [phone]    Behavioral Health Resources in the Community: Intensive Outpatient Programs Organization         Address  Phone  Notes  [facility] [address], [place], [place] [phone]   [facility]patient [address], [place], [place] [phone]   [facility] [address], [place], [place]  [phone]   [facility] [address],  [place], [place] [phone] or [phone]   Substance Abuse  Resources Organization         Address  Phone  Notes  [facility] Drug Services  [phone]   [facility]  [phone]   [facility]  [phone]   [facility]  [phone]   Residential & Outpatient Substance Abuse Program  [phone]   Psychological Services Organization         Address  Phone  Notes  [facility]  [phone]   [facility]  [phone]   [facility] [phone]  N. [address], [place] [phone] or [phone]    Mobile Crisis Teams Organization         Address  Phone  Notes  [facility], Mobile Crisis Care Unit  [phone]   [facility]  [address] [place], [place] [phone]   Doristine LocksSharon DeEsch [address] [place] [phone]    Self-Help/Support Groups Organization         Address  Phone             Notes  Mental Health Assoc. of  - variety of support groups  [phone] Call for more information  Narcotics Anonymous (NA), Caring Services [address], [place]  2 meetings at this location   Statisticianesidential Treatment Programs Organization         Address  Phone  Notes  [facility] [address],    [place]  [phone]   [facility]  [address], [place] [phone], [place], [place] [phone]   [facility] [address], [place] [phone] Admissions: 8am-3pm M-F  [facility] [address]    [place], [place] [phone]   [facility] [address], [place], [place] [phone]   [facility] [address]  [place], [place] [phone]   [facility] - Intensive Outpatient [address], [place], [place] [phone]   [facility] ([facility] [address]  [place], [place] [phone] or [phone]   [facility] ([facility]) [address] [place], [place] [phone] Accepts Medicaid  [facility] [address]  [place] [phone] Substance Abuse/Addiction Treatment   [facility]al Health Resources Organization         Address  Phone  Notes  [facility]  [phone]   Angie FavaJulie Brannon, PhD [address], [address] [place], [place]   [phone] or [phone]   [facility]   [address] [place], [place] [phone]   [facility] [address], [place], [place] [phone] Insurance/Medicaid/sponsorship through [facility]  [facility] [address]                                    [place], [place] [phone] Therapy/tele-psych/case  [facility] [address].   [place], [place] [phone]) [phone]    Dr. Lolly MustacheArfeen  [phone]   [facility]  [employer] [facility] 1) [address], [place] 2) [address], [place] 3)  [address], [place] [phone]  [phone]) [phone]   [facility] Child Abuse Hotline [phone] or [phone] (After Hours)

## 2014-06-26 NOTE — ED Provider Notes (Signed)
CSN: [id number]     Arrival date & time 06/26/14  8657 History  This chart was scribed for non-physician practitioner, Harle Battiest, NP working with Juliet Rude. Rubin Payor, MD by Greggory Stallion, ED scribe. This patient was seen in room TR08C/TR08C and the patient's care was started at 10:19 AM.    Chief Complaint  Patient presents with  . URI   The history is provided by the patient. No language interpreter was used.    HPI Comments: Gina Hooper is a 23 y.o. female who presents to the Emergency Department complaining of nonproductive cough that started one month ago. Symptoms started with rhinorrhea and congestion. Pt has taken NyQuil and used cough drops with no relief. Denies fever, chills, ear pain, sore throat, abdominal pain, emesis, diarrhea. Denies history of diabetes.   Past Medical History  Diagnosis Date  . No pertinent past medical history   . Migraine    Past Surgical History  Procedure Laterality Date  . No past surgeries     History reviewed. No pertinent family history. History  Substance Use Topics  . Smoking status: Current Every Day Smoker -- 0.25 packs/day for 5 years    Types: Cigarettes  . Smokeless tobacco: Never Used  . Alcohol Use: Yes   OB History    Gravida Para Term Preterm AB TAB SAB Ectopic Multiple Living   1 1 1  0 0 0 0 0 0 1     Review of Systems  Constitutional: Negative for fever and chills.  HENT: Positive for congestion and rhinorrhea. Negative for ear pain and sore throat.   Respiratory: Positive for cough.   Gastrointestinal: Negative for vomiting, abdominal pain and diarrhea.  All other systems reviewed and are negative.  Allergies  Review of patient's allergies indicates no known allergies.  Home Medications   Prior to Admission medications   Medication Sig Start Date End Date Taking? Authorizing Provider  erythromycin ophthalmic ointment Place a 1/2 inch ribbon of ointment into the lower eyelid twice day for 1 week  02/08/14   Elwin Mocha, MD  HYDROcodone-acetaminophen (NORCO/VICODIN) 5-325 MG per tablet Take 1 tablet by mouth every 6 (six) hours as needed for moderate pain. 02/08/14   Elwin Mocha, MD   BP 115/72 mmHg  Pulse 77  Temp(Src) 98.1 F (36.7 C) (Oral)  Resp 20  Ht 5\' 6"  (1.676 m)  Wt 148 lb (67.132 kg)  BMI 23.90 kg/m2  SpO2 98%   Physical Exam  Constitutional: She is oriented to person, place, and time. She appears well-developed and well-nourished. No distress.  HENT:  Head: Normocephalic and atraumatic.  Eyes: Conjunctivae and EOM are normal.  Neck: Neck supple. No tracheal deviation present.  Cardiovascular: Normal rate, regular rhythm, S1 normal, S2 normal and normal heart sounds.  Exam reveals no gallop and no friction rub.   No murmur heard. Pulmonary/Chest: Effort normal. No respiratory distress. She has decreased breath sounds. She has no wheezes. She has no rhonchi. She has no rales.  Clear but decreased sounds in bilateral bases.   Musculoskeletal: Normal range of motion.  Neurological: She is alert and oriented to person, place, and time.  Skin: Skin is warm and dry.  Psychiatric: She has a normal mood and affect. Her behavior is normal.  Nursing note and vitals reviewed.   ED Course  Procedures (including critical care time)  DIAGNOSTIC STUDIES: Oxygen Saturation is 98% on RA, normal by my interpretation.    COORDINATION OF CARE: 10:21 AM-Discussed treatment plan  which includes chest xray , neb treatment and steroids with pt at bedside and pt agreed to plan.   Labs Review Labs Reviewed - No data to display  Imaging Review Dg Chest 2 View  06/26/2014   CLINICAL DATA:  23 year old female with shortness of breath productive cough chest pain and congestion for 1 month. Current history of smoking. Initial encounter.  EXAM: CHEST  2 VIEW  COMPARISON:  None.  FINDINGS: Normal lung volumes. Cardiac size at the upper limits of normal. Other mediastinal contours are  within normal limits. Visualized tracheal air column is within normal limits. Probable tiny vessel on in projecting in the right upper lung between the posterior sixth and seventh ribs. Otherwise lung parenchyma is clear. No pneumothorax or effusion. No osseous abnormality identified.  IMPRESSION: No acute cardiopulmonary abnormality.   Electronically Signed   By: Augusto GambleLee  Hall M.D.   On: 06/26/2014 10:45     EKG Interpretation None      MDM   Final diagnoses:  Productive cough  Bronchitis   23 yo with extended cough and URI symptoms. Her CXR is negative for acute infiltrate. Patients symptoms are consistent with URI, likely viral etiology. Cough and shortness of breath improved after neb tx.  Discussed that antibiotics are not indicated for viral infections. Pt will be discharged with symptomatic treatment including prescription for albuterol neb and prescription for prednisone 5 day burst. Pt is hemodynamically stable & in NAD prior to dc.  Verbalizes understanding and is agreeable with plan.  Return precautions provided.    I personally performed the services described in this documentation, which was scribed in my presence. The recorded information has been reviewed and is accurate.  Filed Vitals:   06/26/14 1003 06/26/14 1140  BP: 115/72 123/74  Pulse: 77 82  Temp: 98.1 F (36.7 C) 98 F (36.7 C)  TempSrc: Oral Oral  Resp: 20 18  Height: 5\' 6"  (1.676 m)   Weight: 148 lb (67.132 kg)   SpO2: 98% 100%   Meds given in ED:  Medications  albuterol (PROVENTIL) (2.5 MG/3ML) 0.083% nebulizer solution 5 mg (5 mg Nebulization Given 06/26/14 1052)  ipratropium (ATROVENT) nebulizer solution 0.5 mg (0.5 mg Nebulization Given 06/26/14 1052)  predniSONE (DELTASONE) tablet 40 mg (40 mg Oral Given 06/26/14 1052)    Discharge Medication List as of 06/26/2014 11:11 AM    START taking these medications   Details  albuterol (PROVENTIL HFA;VENTOLIN HFA) 108 (90 BASE) MCG/ACT inhaler Inhale 2 puffs  into the lungs every 4 (four) hours as needed for wheezing or shortness of breath., Starting 06/26/2014, Until Discontinued, Print    predniSONE (DELTASONE) 20 MG tablet Take 2 tablets (40 mg total) by mouth daily., Starting 06/26/2014, Until Discontinued, Print           Harle BattiestElizabeth Dereona Kolodny, NP 06/26/14 1950

## 2014-06-26 NOTE — ED Notes (Signed)
Pt c/o URI and cough with congestion x 1 month

## 2015-02-24 ENCOUNTER — Emergency Department (HOSPITAL_COMMUNITY)
Admission: EM | Admit: 2015-02-24 | Discharge: 2015-02-24 | Disposition: A | Discharge status: Home / Self Care | Payer: No Typology Code available for payment source

## 2015-02-24 NOTE — ED Notes (Signed)
Called for triage with no response  

## 2015-02-24 NOTE — ED Notes (Signed)
Called for triage x 3 no answer. Unable to locate the patient x 3 for triage

## 2015-02-24 NOTE — ED Notes (Signed)
Pt called for with no answer

## 2015-03-01 ENCOUNTER — Encounter (HOSPITAL_COMMUNITY): Payer: Self-pay

## 2015-03-01 ENCOUNTER — Emergency Department (HOSPITAL_COMMUNITY)
Admission: EM | Admit: 2015-03-01 | Discharge: 2015-03-01 | Disposition: A | Discharge status: Home / Self Care | Payer: No Typology Code available for payment source | Attending: Emergency Medicine | Admitting: Emergency Medicine

## 2015-03-01 DIAGNOSIS — Y92410 Unspecified street and highway as the place of occurrence of the external cause: Secondary | ICD-10-CM | POA: Diagnosis not present

## 2015-03-01 DIAGNOSIS — S0990XA Unspecified injury of head, initial encounter: Secondary | ICD-10-CM | POA: Diagnosis not present

## 2015-03-01 DIAGNOSIS — Y939 Activity, unspecified: Secondary | ICD-10-CM | POA: Insufficient documentation

## 2015-03-01 DIAGNOSIS — Z8669 Personal history of other diseases of the nervous system and sense organs: Secondary | ICD-10-CM | POA: Diagnosis not present

## 2015-03-01 DIAGNOSIS — S3992XA Unspecified injury of lower back, initial encounter: Secondary | ICD-10-CM | POA: Insufficient documentation

## 2015-03-01 DIAGNOSIS — Y999 Unspecified external cause status: Secondary | ICD-10-CM | POA: Diagnosis not present

## 2015-03-01 DIAGNOSIS — Z79899 Other long term (current) drug therapy: Secondary | ICD-10-CM | POA: Diagnosis not present

## 2015-03-01 DIAGNOSIS — Z043 Encounter for examination and observation following other accident: Secondary | ICD-10-CM | POA: Diagnosis present

## 2015-03-01 DIAGNOSIS — Z720 Tobacco use: Secondary | ICD-10-CM | POA: Insufficient documentation

## 2015-03-01 MED ORDER — METHOCARBAMOL 500 MG PO TABS: 500.0000 mg | ORAL_TABLET | Freq: Two times a day (BID) | ORAL | Status: DC

## 2015-03-01 MED ORDER — NAPROXEN 500 MG PO TABS: 500.0000 mg | ORAL_TABLET | Freq: Two times a day (BID) | ORAL | Status: DC
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
## 2015-03-01 NOTE — Discharge Instructions (Signed)

## 2015-10-13 ENCOUNTER — Emergency Department (HOSPITAL_COMMUNITY)
Admission: EM | Admit: 2015-10-13 | Discharge: 2015-10-14 | Disposition: A | Discharge status: Home / Self Care | Payer: Medicaid Other | Attending: Physician Assistant | Admitting: Physician Assistant

## 2015-10-13 ENCOUNTER — Encounter (HOSPITAL_COMMUNITY): Payer: Self-pay

## 2015-10-13 ENCOUNTER — Emergency Department (HOSPITAL_COMMUNITY): Payer: Medicaid Other

## 2015-10-13 DIAGNOSIS — Z3202 Encounter for pregnancy test, result negative: Secondary | ICD-10-CM | POA: Insufficient documentation

## 2015-10-13 DIAGNOSIS — F17210 Nicotine dependence, cigarettes, uncomplicated: Secondary | ICD-10-CM | POA: Insufficient documentation

## 2015-10-13 DIAGNOSIS — S199XXA Unspecified injury of neck, initial encounter: Secondary | ICD-10-CM | POA: Insufficient documentation

## 2015-10-13 DIAGNOSIS — Z791 Long term (current) use of non-steroidal anti-inflammatories (NSAID): Secondary | ICD-10-CM | POA: Insufficient documentation

## 2015-10-13 DIAGNOSIS — Y998 Other external cause status: Secondary | ICD-10-CM | POA: Insufficient documentation

## 2015-10-13 DIAGNOSIS — Y92410 Unspecified street and highway as the place of occurrence of the external cause: Secondary | ICD-10-CM | POA: Insufficient documentation

## 2015-10-13 DIAGNOSIS — S0990XA Unspecified injury of head, initial encounter: Secondary | ICD-10-CM | POA: Insufficient documentation

## 2015-10-13 DIAGNOSIS — Z041 Encounter for examination and observation following transport accident: Secondary | ICD-10-CM

## 2015-10-13 DIAGNOSIS — Y9389 Activity, other specified: Secondary | ICD-10-CM | POA: Insufficient documentation

## 2015-10-13 DIAGNOSIS — Z8679 Personal history of other diseases of the circulatory system: Secondary | ICD-10-CM | POA: Insufficient documentation

## 2015-10-13 DIAGNOSIS — Z79899 Other long term (current) drug therapy: Secondary | ICD-10-CM | POA: Insufficient documentation

## 2015-10-13 LAB — COMPREHENSIVE METABOLIC PANEL
ALT: 25 U/L (ref 14–54)
AST: 30 U/L (ref 15–41)
Albumin: 4.4 g/dL (ref 3.5–5.0)
Alkaline Phosphatase: 67 U/L (ref 38–126)
Anion gap: 16 — ABNORMAL HIGH (ref 5–15)
BILIRUBIN TOTAL: 0.3 mg/dL (ref 0.3–1.2)
BUN: 5 mg/dL — AB (ref 6–20)
CHLORIDE: 106 mmol/L (ref 101–111)
CO2: 21 mmol/L — ABNORMAL LOW (ref 22–32)
CREATININE: 0.85 mg/dL (ref 0.44–1.00)
Calcium: 9.6 mg/dL (ref 8.9–10.3)
GFR calc Af Amer: >60 mL/min (ref >60)
Glucose, Bld: 115 mg/dL — ABNORMAL HIGH (ref 65–99)
Potassium: 3.5 mmol/L (ref 3.5–5.1)
Sodium: 143 mmol/L (ref 135–145)
TOTAL PROTEIN: 8.1 g/dL (ref 6.5–8.1)

## 2015-10-13 LAB — CBC WITH DIFFERENTIAL/PLATELET
Basophils Absolute: 0 10*3/uL (ref 0.0–0.1)
Basophils Relative: 0 %
EOS ABS: 0 10*3/uL (ref 0.0–0.7)
EOS PCT: 0 %
HCT: 37.9 % (ref 36.0–46.0)
Hemoglobin: 12.7 g/dL (ref 12.0–15.0)
LYMPHS ABS: 3 10*3/uL (ref 0.7–4.0)
Lymphocytes Relative: 33 %
MCH: 28.5 pg (ref 26.0–34.0)
MCHC: 33.5 g/dL (ref 30.0–36.0)
MCV: 85 fL (ref 78.0–100.0)
Monocytes Absolute: 0.4 10*3/uL (ref 0.1–1.0)
Monocytes Relative: 4 %
Neutro Abs: 5.8 10*3/uL (ref 1.7–7.7)
Neutrophils Relative %: 63 %
PLATELETS: 353 10*3/uL (ref 150–400)
RBC: 4.46 MIL/uL (ref 3.87–5.11)
RDW: 15.1 % (ref 11.5–15.5)
WBC: 9.2 10*3/uL (ref 4.0–10.5)

## 2015-10-13 LAB — ETHANOL: Alcohol, Ethyl (B): 374 mg/dL (ref <5)

## 2015-10-13 LAB — POC URINE PREG, ED: Preg Test, Ur: NEGATIVE

## 2015-10-13 MED ORDER — SODIUM CHLORIDE 0.9 % IV BOLUS (SEPSIS)
1000.0000 mL | Freq: Once | INTRAVENOUS | Status: AC
  Administered 2015-10-13: 1000 mL via INTRAVENOUS

## 2015-10-13 MED ORDER — TETANUS-DIPHTH-ACELL PERTUSSIS 5-2.5-18.5 LF-MCG/0.5 IM SUSP
0.5000 mL | Freq: Once | INTRAMUSCULAR | Status: AC
  Administered 2015-10-13: 0.5 mL via INTRAMUSCULAR
  Filled 2015-10-13: qty 0.5

## 2015-10-13 MED ORDER — IOHEXOL 300 MG/ML  SOLN
100.0000 mL | Freq: Once | INTRAMUSCULAR | Status: AC | PRN
  Administered 2015-10-13: 100 mL via INTRAVENOUS

## 2015-10-13 NOTE — ED Notes (Signed)
Patient transported to CT 
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
## 2015-10-13 NOTE — ED Notes (Signed)
CRITICAL VALUE ALERT  Critical value received:  ETOH 378  Date of notification:  10/13/15  Time of notification:  1805  Critical value read back: yes  MD notified: [facility]
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
## 2015-10-14 NOTE — ED Notes (Signed)
Pt ambulated with stead gait, no sob/cp/dizziness and pt in NAD.

## 2016-03-27 ENCOUNTER — Inpatient Hospital Stay (HOSPITAL_COMMUNITY)
Admission: AD | Admit: 2016-03-27 | Discharge: 2016-03-27 | Discharge status: Against Medical Advice | Payer: Medicaid Other | Source: Ambulatory Visit | Attending: Obstetrics and Gynecology | Admitting: Obstetrics and Gynecology

## 2016-03-27 ENCOUNTER — Encounter (HOSPITAL_COMMUNITY): Payer: Self-pay | Admitting: *Deleted

## 2016-03-27 DIAGNOSIS — Z5321 Procedure and treatment not carried out due to patient leaving prior to being seen by health care provider: Secondary | ICD-10-CM | POA: Insufficient documentation

## 2016-03-27 LAB — URINALYSIS, ROUTINE W REFLEX MICROSCOPIC
Bilirubin Urine: NEGATIVE
Glucose, UA: NEGATIVE mg/dL
Ketones, ur: NEGATIVE mg/dL
Leukocytes, UA: NEGATIVE
NITRITE: NEGATIVE
PROTEIN: NEGATIVE mg/dL
pH: 5 (ref 5.0–8.0)

## 2016-03-27 LAB — URINE MICROSCOPIC-ADD ON: SQUAMOUS EPITHELIAL / LPF: NONE SEEN

## 2016-03-27 LAB — POCT PREGNANCY, URINE: PREG TEST UR: NEGATIVE

## 2016-03-27 NOTE — MAU Note (Signed)
Pt presents with rambling, slurred speech and complaint of vag bleeding off and on since early July.reports having had a miscarriage before and was sure thats what was going on. Denies any pain .

## 2016-03-27 NOTE — MAU Note (Signed)
Pt laughing and said, "well im going home now and get in the bed and hang out, ya know how we do" . Pt peacefully got dressed and signed out AMA stating ive found out what I needed to know.

## 2016-03-27 NOTE — MAU Note (Signed)
Pt c/o vag bleeding that started a couple days ago. Denies pain LMP: 02/02/2016. Pt is slurring speech in triage and laughing at inappropriate times.

## 2017-03-16 ENCOUNTER — Encounter (HOSPITAL_BASED_OUTPATIENT_CLINIC_OR_DEPARTMENT_OTHER): Payer: Self-pay | Admitting: Emergency Medicine

## 2017-03-16 ENCOUNTER — Emergency Department (HOSPITAL_BASED_OUTPATIENT_CLINIC_OR_DEPARTMENT_OTHER)
Admission: EM | Admit: 2017-03-16 | Discharge: 2017-03-16 | Disposition: A | Discharge status: Home / Self Care | Payer: Medicaid Other | Attending: Emergency Medicine | Admitting: Emergency Medicine

## 2017-03-16 DIAGNOSIS — Z79899 Other long term (current) drug therapy: Secondary | ICD-10-CM | POA: Insufficient documentation

## 2017-03-16 DIAGNOSIS — J029 Acute pharyngitis, unspecified: Secondary | ICD-10-CM

## 2017-03-16 DIAGNOSIS — F17210 Nicotine dependence, cigarettes, uncomplicated: Secondary | ICD-10-CM | POA: Insufficient documentation

## 2017-03-16 DIAGNOSIS — M791 Myalgia, unspecified site: Secondary | ICD-10-CM

## 2017-03-16 DIAGNOSIS — R51 Headache: Secondary | ICD-10-CM | POA: Insufficient documentation

## 2017-03-16 MED ORDER — IBUPROFEN 400 MG PO TABS
600.0000 mg | ORAL_TABLET | Freq: Once | ORAL | Status: AC
  Administered 2017-03-16: 600 mg via ORAL
  Filled 2017-03-16: qty 1

## 2017-03-16 MED ORDER — IBUPROFEN 600 MG PO TABS: 600.0000 mg | ORAL_TABLET | Freq: Three times a day (TID) | ORAL | 0 refills | Status: DC | PRN

## 2017-03-16 NOTE — ED Triage Notes (Signed)
Headache with sore throat and occasional neck aching for 2 days.  No fever.  No redness or swelling noted in throat.  No exudate.  Pt states she is under a lot of stress.

## 2017-03-16 NOTE — ED Provider Notes (Signed)
[facility]   CSN: [id number] Arrival date & time: 03/16/17  1003     History   Chief Complaint Chief Complaint  Patient presents with  . Headache  . Sore Throat    HPI Gina Hooper is a 26 y.o. female.  HPI   Pt p/w mild intermittent headaches and mild sore throat x 3 days.  Also notes muscle tension in her upper back.   Headaches come and go, last a few seconds at a time.  Does not take medication for them.  She does note she is stressed out from "dealing with people."   Denies fevers, nasal congestion, cough, SOB, chest pain, ear pain.  No known sick contacts.  Denies allergies.  No trauma.    Past Medical History:  Diagnosis Date  . No pertinent past medical history     There are no active problems to display for this patient.   Past Surgical History:  Procedure Laterality Date  . NO PAST SURGERIES      OB History    Gravida Para Term Preterm AB Living   2 1 1  0 1 1   SAB TAB Ectopic Multiple Live Births   0 0 0 0 1       Home Medications    Prior to Admission medications   Medication Sig Start Date End Date Taking? Authorizing Provider  traZODone (DESYREL) 50 MG tablet Take 50 mg by mouth at bedtime.   Yes [provider]  ibuprofen (ADVIL,MOTRIN) 600 MG tablet Take 1 tablet (600 mg total) by mouth every 8 (eight) hours as needed. 03/16/17   Trixie Dredge, PA-C    Family History No family history on file.  Social History Social History  Substance Use Topics  . Smoking status: Current Every Day Smoker    Packs/day: 0.25    Years: 5.00    Types: Cigarettes  . Smokeless tobacco: Never Used  . Alcohol use 1.8 oz/week    3 Cans of beer per week     Allergies   Other   Review of Systems Review of Systems  All other systems reviewed and are negative.    Physical Exam Updated Vital Signs BP 131/82 (BP Location: Left Arm)   Pulse 80   Temp 98.7 F (37.1 C) (Oral)   Resp 16   Ht 5\' 6"  (1.676 m)    Wt 64.4 kg (142 lb)   LMP 02/23/2017   SpO2 100%   BMI 22.92 kg/m   Physical Exam  Constitutional: She appears well-developed and well-nourished. No distress.  HENT:  Head: Normocephalic and atraumatic.  Right Ear: Tympanic membrane and ear canal normal.  Left Ear: Tympanic membrane and ear canal normal.  Nose: Right sinus exhibits no maxillary sinus tenderness and no frontal sinus tenderness. Left sinus exhibits no maxillary sinus tenderness and no frontal sinus tenderness.  Mouth/Throat: Uvula is midline, oropharynx is clear and moist and mucous membranes are normal. Mucous membranes are not dry. No oropharyngeal exudate, posterior oropharyngeal edema or tonsillar abscesses.  Mild erythema posterior pharynx  Eyes: Conjunctivae are normal.  Neck: Normal range of motion. Neck supple.  Cardiovascular: Normal rate and regular rhythm.   Pulmonary/Chest: Effort normal and breath sounds normal. No stridor. No respiratory distress. She has no wheezes. She has no rales.  Lymphadenopathy:    She has no cervical adenopathy.  Neurological: She is alert. GCS eye subscore is 4. GCS verbal subscore is 5. GCS motor subscore is 6.  CN II-XII intact, EOMs intact, no pronator drift, grip strengths equal bilaterally; strength 5/5 in all extremities, sensation intact in all extremities; finger to nose, heel to shin, rapid alternating movements normal; gait is normal.     Skin: She is not diaphoretic.  Nursing note and vitals reviewed.    ED Treatments / Results  Labs (all labs ordered are listed, but only abnormal results are displayed) Labs Reviewed - No data to display  EKG  EKG Interpretation None       Radiology No results found.  Procedures Procedures (including critical care time)  Medications Ordered in ED Medications  ibuprofen (ADVIL,MOTRIN) tablet 600 mg (600 mg Oral Given 03/16/17 1058)     Initial Impression / Assessment and Plan / ED Course  I have reviewed the triage  vital signs and the nursing notes.  Pertinent labs & imaging results that were available during my care of the patient were reviewed by me and considered in my medical decision making (see chart for details).     Afebrile, nontoxic patient with constellation of symptoms suggestive of mild early viral syndrome.  No concerning findings on exam.  Also with increased stress, has muscle soreness in trapezius - improved with palpation.  No nuchal rigidity.  Clinically doubt strep pharyngitis.  Discharged home with supportive care, PCP follow up.  Discussed result, findings, treatment, and follow up  with patient.  Pt given return precautions.  Pt verbalizes understanding and agrees with plan.      Final Clinical Impressions(s) / ED Diagnoses   Final diagnoses:  Viral pharyngitis  Sore muscles    New Prescriptions Discharge Medication List as of 03/16/2017 10:37 AM    START taking these medications   Details  ibuprofen (ADVIL,MOTRIN) 600 MG tablet Take 1 tablet (600 mg total) by mouth every 8 (eight) hours as needed., Starting Mon 03/16/2017, Print         Trixie Dredge, PA-C 03/16/17 1216    Alvira Monday, MD 03/17/17 1144

## 2017-03-16 NOTE — Discharge Instructions (Signed)
Read the information below.  Use the prescribed medication as directed.  Please discuss all new medications with your pharmacist.  You may return to the Emergency Department at any time for worsening condition or any new symptoms that concern you.    °

## 2017-04-01 ENCOUNTER — Emergency Department (HOSPITAL_BASED_OUTPATIENT_CLINIC_OR_DEPARTMENT_OTHER)
Admission: EM | Admit: 2017-04-01 | Discharge: 2017-04-01 | Disposition: A | Discharge status: Home / Self Care | Payer: Medicaid Other | Attending: Emergency Medicine | Admitting: Emergency Medicine

## 2017-04-01 ENCOUNTER — Encounter (HOSPITAL_BASED_OUTPATIENT_CLINIC_OR_DEPARTMENT_OTHER): Payer: Self-pay | Admitting: *Deleted

## 2017-04-01 DIAGNOSIS — Z79899 Other long term (current) drug therapy: Secondary | ICD-10-CM | POA: Insufficient documentation

## 2017-04-01 DIAGNOSIS — F17210 Nicotine dependence, cigarettes, uncomplicated: Secondary | ICD-10-CM | POA: Insufficient documentation

## 2017-04-01 DIAGNOSIS — F1010 Alcohol abuse, uncomplicated: Secondary | ICD-10-CM | POA: Insufficient documentation

## 2017-04-01 HISTORY — DX: Depression, unspecified: F32.A

## 2017-04-01 HISTORY — DX: Major depressive disorder, single episode, unspecified: F32.9

## 2017-04-01 HISTORY — DX: Alcohol abuse, uncomplicated: F10.10

## 2017-04-01 LAB — COMPREHENSIVE METABOLIC PANEL
ALBUMIN: 4.3 g/dL (ref 3.5–5.0)
ALK PHOS: 57 U/L (ref 38–126)
ALT: 21 U/L (ref 14–54)
AST: 31 U/L (ref 15–41)
Anion gap: 9 (ref 5–15)
BILIRUBIN TOTAL: 0.5 mg/dL (ref 0.3–1.2)
BUN: 7 mg/dL (ref 6–20)
CALCIUM: 9.9 mg/dL (ref 8.9–10.3)
CO2: 29 mmol/L (ref 22–32)
CREATININE: 0.69 mg/dL (ref 0.44–1.00)
Chloride: 101 mmol/L (ref 101–111)
GFR calc Af Amer: >60 mL/min (ref >60)
GLUCOSE: 102 mg/dL — AB (ref 65–99)
POTASSIUM: 3.9 mmol/L (ref 3.5–5.1)
Sodium: 139 mmol/L (ref 135–145)
TOTAL PROTEIN: 8.1 g/dL (ref 6.5–8.1)

## 2017-04-01 LAB — CBC WITH DIFFERENTIAL/PLATELET
BASOS ABS: 0 10*3/uL (ref 0.0–0.1)
Basophils Relative: 0 %
Eosinophils Absolute: 0.1 10*3/uL (ref 0.0–0.7)
Eosinophils Relative: 1 %
HEMATOCRIT: 32.8 % — AB (ref 36.0–46.0)
HEMOGLOBIN: 10.6 g/dL — AB (ref 12.0–15.0)
LYMPHS PCT: 30 %
Lymphs Abs: 1.7 10*3/uL (ref 0.7–4.0)
MCH: 27.9 pg (ref 26.0–34.0)
MCHC: 32.3 g/dL (ref 30.0–36.0)
MCV: 86.3 fL (ref 78.0–100.0)
MONO ABS: 0.7 10*3/uL (ref 0.1–1.0)
Monocytes Relative: 12 %
NEUTROS ABS: 3.3 10*3/uL (ref 1.7–7.7)
NEUTROS PCT: 57 %
Platelets: 406 10*3/uL — ABNORMAL HIGH (ref 150–400)
RBC: 3.8 MIL/uL — ABNORMAL LOW (ref 3.87–5.11)
RDW: 16.9 % — AB (ref 11.5–15.5)
WBC: 5.8 10*3/uL (ref 4.0–10.5)

## 2017-04-01 LAB — RAPID URINE DRUG SCREEN, HOSP PERFORMED
AMPHETAMINES: NOT DETECTED
BARBITURATES: NOT DETECTED
BENZODIAZEPINES: NOT DETECTED
COCAINE: NOT DETECTED
Opiates: NOT DETECTED
Tetrahydrocannabinol: NOT DETECTED

## 2017-04-01 LAB — PREGNANCY, URINE: PREG TEST UR: NEGATIVE

## 2017-04-01 LAB — ETHANOL: Alcohol, Ethyl (B): <5 mg/dL (ref <5)

## 2017-04-01 LAB — ACETAMINOPHEN LEVEL: Acetaminophen (Tylenol), Serum: <10 ug/mL — ABNORMAL LOW (ref 10–30)

## 2017-04-01 NOTE — ED Triage Notes (Signed)
Pt here for medical clarence for [facility] placement

## 2017-04-01 NOTE — ED Provider Notes (Signed)
[facility]   CSN: [id number] Arrival date & time: 04/01/17  1415     History   Chief Complaint Chief Complaint  Patient presents with  . Medical Clearance    HPI Gina Hooper is a 26 y.o. female who presents for medical clearance to attend day [facility] facility. Patient reports that she is entering this facility for treatment of alcohol abuse. Patient states that her last drink was probably 2 days ago when she had to 12 ounce beers. Patient states that she normally drinks between 5 beers a day. He states she has never had a seizure secondary to alcohol withdrawal. Patient denies any other substance abuse. Patient coming in today requesting medical clearance that she can attending [facility]. Patient plans to enroll herself either tonight or tomorrow. Patient denies any cocaine, heroine, marijuana use. Patient reports that she does smoke cigarettes. Patient denies any SI/HI. Patient denies any fevers/chills, chest pain, shortness of breath, abdominal pain, nausea/vomiting, dysuria or hematuria.  The history is provided by the patient.    Past Medical History:  Diagnosis Date  . Depression   . ETOH abuse   . No pertinent past medical history     There are no active problems to display for this patient.   Past Surgical History:  Procedure Laterality Date  . NO PAST SURGERIES      OB History    Gravida Para Term Preterm AB Living   2 1 1  0 1 1   SAB TAB Ectopic Multiple Live Births   0 0 0 0 1       Home Medications    Prior to Admission medications   Medication Sig Start Date End Date Taking? Authorizing Provider  ibuprofen (ADVIL,MOTRIN) 600 MG tablet Take 1 tablet (600 mg total) by mouth every 8 (eight) hours as needed. 03/16/17   Trixie Dredge, PA-C  traZODone (DESYREL) 50 MG tablet Take 50 mg by mouth at bedtime.    [provider]    Family History History reviewed. No pertinent family history.  Social History Social  History  Substance Use Topics  . Smoking status: Current Every Day Smoker    Packs/day: 0.25    Years: 5.00    Types: Cigarettes  . Smokeless tobacco: Never Used  . Alcohol use 1.8 oz/week    3 Cans of beer per week     Allergies   Other   Review of Systems Review of Systems  Constitutional: Negative for chills and fever.  Respiratory: Negative for cough and shortness of breath.   Cardiovascular: Negative for chest pain.  Gastrointestinal: Negative for abdominal pain, nausea and vomiting.  Genitourinary: Negative for dysuria and hematuria.  Psychiatric/Behavioral: Negative for suicidal ideas.     Physical Exam Updated Vital Signs BP 118/88 (BP Location: Right Arm)   Pulse (!) 57   Temp 98.2 F (36.8 C) (Oral)   Resp 16   Ht 5\' 6"  (1.676 m)   Wt 63.5 kg (140 lb)   LMP 03/24/2017   SpO2 100%   BMI 22.60 kg/m   Physical Exam  Constitutional: She is oriented to person, place, and time. She appears well-developed and well-nourished.  Sitting comfortably on examination table  HENT:  Head: Normocephalic and atraumatic.  Mouth/Throat: Oropharynx is clear and moist and mucous membranes are normal.  Eyes: Pupils are equal, round, and reactive to light. Conjunctivae, EOM and lids are normal.  Neck: Full passive range of motion without pain.  Cardiovascular: Normal  rate, regular rhythm, normal heart sounds and normal pulses.  Exam reveals no gallop and no friction rub.   No murmur heard. Pulmonary/Chest: Effort normal and breath sounds normal.  No evidence of respiratory distress. Able to speak in full sentences without difficulty.  Abdominal: Soft. Normal appearance. There is no tenderness. There is no rigidity and no guarding.  Musculoskeletal: Normal range of motion.  Neurological: She is alert and oriented to person, place, and time.  Skin: Skin is warm and dry. Capillary refill takes less than 2 seconds.  Psychiatric: She has a normal mood and affect. Her speech is  normal. She expresses no homicidal and no suicidal ideation.  Nursing note and vitals reviewed.    ED Treatments / Results  Labs (all labs ordered are listed, but only abnormal results are displayed) Labs Reviewed  CBC WITH DIFFERENTIAL/PLATELET - Abnormal; Notable for the following:       Result Value   RBC 3.80 (*)    Hemoglobin 10.6 (*)    HCT 32.8 (*)    RDW 16.9 (*)    Platelets 406 (*)    All other components within normal limits  COMPREHENSIVE METABOLIC PANEL - Abnormal; Notable for the following:    Glucose, Bld 102 (*)    All other components within normal limits  ACETAMINOPHEN LEVEL - Abnormal; Notable for the following:    Acetaminophen (Tylenol), Serum <10 (*)    All other components within normal limits  RAPID URINE DRUG SCREEN, HOSP PERFORMED  PREGNANCY, URINE  ETHANOL    EKG  EKG Interpretation None       Radiology No results found.  Procedures Procedures (including critical care time)  Medications Ordered in ED Medications - No data to display   Initial Impression / Assessment and Plan / ED Course  I have reviewed the triage vital signs and the nursing notes.  Pertinent labs & imaging results that were available during my care of the patient were reviewed by me and considered in my medical decision making (see chart for details).     26 year old female who presents for medical clearance to for Day [facility] facility. Patient is afebrile, non-toxic appearing, sitting comfortably on examination table. Vital signs reviewed and stable. She does not appear intoxicated in the department today. She denies any SI/HI. Medical clearance labs ordered at triage.   Labs reviewed. The patient's hyperglycemia otherwise unremarkable. CBC shows slight anemia. Records reviews of this consistent with previous. Ethanol level is unremarkable. Pregnancy is negative. Acetaminophen level is unremarkable. Rapid urine drug screen is negative. Discussed results with patient.  Patient is medically cleared to attend Day [facility] facility.   Final Clinical Impressions(s) / ED Diagnoses   Final diagnoses:  Alcohol abuse    New Prescriptions Discharge Medication List as of 04/01/2017  5:37 PM       Maxwell Caul, PA-C 04/02/17 0244    Tegeler, Canary Brim, MD 04/02/17 [phone]

## 2017-04-01 NOTE — Discharge Instructions (Signed)
Go to Gina Hooper as directed.   Follow-up with the referred primary clinic as needed.   Return to the Emergency Department for any thoughts of wanting to hurt or kill yourself or any others, chest pain, SOB, headache, dizziness or any other concerns.

## 2017-08-06 ENCOUNTER — Encounter (HOSPITAL_BASED_OUTPATIENT_CLINIC_OR_DEPARTMENT_OTHER): Payer: Self-pay | Admitting: Emergency Medicine

## 2017-08-06 ENCOUNTER — Other Ambulatory Visit: Payer: Self-pay

## 2017-08-06 ENCOUNTER — Emergency Department (HOSPITAL_BASED_OUTPATIENT_CLINIC_OR_DEPARTMENT_OTHER)
Admission: EM | Admit: 2017-08-06 | Discharge: 2017-08-06 | Disposition: A | Discharge status: Home / Self Care | Payer: No Typology Code available for payment source | Attending: Physician Assistant | Admitting: Physician Assistant

## 2017-08-06 DIAGNOSIS — M791 Myalgia, unspecified site: Secondary | ICD-10-CM | POA: Diagnosis present

## 2017-08-06 DIAGNOSIS — Y999 Unspecified external cause status: Secondary | ICD-10-CM | POA: Diagnosis not present

## 2017-08-06 DIAGNOSIS — F17210 Nicotine dependence, cigarettes, uncomplicated: Secondary | ICD-10-CM | POA: Insufficient documentation

## 2017-08-06 DIAGNOSIS — Z791 Long term (current) use of non-steroidal anti-inflammatories (NSAID): Secondary | ICD-10-CM | POA: Diagnosis not present

## 2017-08-06 DIAGNOSIS — Y929 Unspecified place or not applicable: Secondary | ICD-10-CM | POA: Diagnosis not present

## 2017-08-06 DIAGNOSIS — Y9389 Activity, other specified: Secondary | ICD-10-CM | POA: Diagnosis not present

## 2017-08-06 DIAGNOSIS — M7918 Myalgia, other site: Secondary | ICD-10-CM | POA: Diagnosis not present

## 2017-08-06 LAB — PREGNANCY, URINE: PREG TEST UR: NEGATIVE

## 2017-08-06 MED ORDER — CYCLOBENZAPRINE HCL 10 MG PO TABS: 10.0000 mg | ORAL_TABLET | Freq: Two times a day (BID) | ORAL | 0 refills | Status: DC | PRN

## 2017-08-06 MED ORDER — IBUPROFEN 800 MG PO TABS: 800.0000 mg | ORAL_TABLET | Freq: Three times a day (TID) | ORAL | 0 refills | Status: DC

## 2017-08-06 MED ORDER — IBUPROFEN 800 MG PO TABS
800.0000 mg | ORAL_TABLET | Freq: Once | ORAL | Status: AC
  Administered 2017-08-06: 800 mg via ORAL
  Filled 2017-08-06: qty 1

## 2017-08-06 NOTE — Discharge Instructions (Signed)
Please return with any concerns.  Please use heat or ice to help with your symptoms.
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
## 2017-08-12 ENCOUNTER — Emergency Department (HOSPITAL_COMMUNITY)
Admission: EM | Admit: 2017-08-12 | Discharge: 2017-08-12 | Payer: No Typology Code available for payment source | Attending: Emergency Medicine | Admitting: Emergency Medicine

## 2017-08-12 ENCOUNTER — Other Ambulatory Visit: Payer: Self-pay

## 2017-08-12 ENCOUNTER — Encounter (HOSPITAL_COMMUNITY): Payer: Self-pay | Admitting: Emergency Medicine

## 2017-08-12 DIAGNOSIS — Y92410 Unspecified street and highway as the place of occurrence of the external cause: Secondary | ICD-10-CM | POA: Diagnosis not present

## 2017-08-12 DIAGNOSIS — Y999 Unspecified external cause status: Secondary | ICD-10-CM | POA: Diagnosis not present

## 2017-08-12 DIAGNOSIS — R51 Headache: Secondary | ICD-10-CM | POA: Insufficient documentation

## 2017-08-12 DIAGNOSIS — F17210 Nicotine dependence, cigarettes, uncomplicated: Secondary | ICD-10-CM | POA: Insufficient documentation

## 2017-08-12 DIAGNOSIS — Y939 Activity, unspecified: Secondary | ICD-10-CM | POA: Insufficient documentation

## 2017-08-12 DIAGNOSIS — R519 Headache, unspecified: Secondary | ICD-10-CM

## 2017-08-12 DIAGNOSIS — S0990XA Unspecified injury of head, initial encounter: Secondary | ICD-10-CM | POA: Diagnosis present

## 2017-08-12 LAB — POC URINE PREG, ED: Preg Test, Ur: NEGATIVE

## 2017-08-12 MED ORDER — ACETAMINOPHEN 325 MG PO TABS
650.0000 mg | ORAL_TABLET | Freq: Once | ORAL | Status: AC
  Administered 2017-08-12: 650 mg via ORAL
  Filled 2017-08-12: qty 2

## 2017-08-12 NOTE — ED Notes (Signed)
Bed: WTR5 Expected date:  Expected time:  Means of arrival:  Comments: 
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
## 2017-08-12 NOTE — Discharge Instructions (Signed)
Return to the ER if you experience any persistent headaches, nausea, vomiting, or any other new or worsening symptoms.
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
## 2017-11-28 ENCOUNTER — Emergency Department (HOSPITAL_COMMUNITY): Payer: Self-pay

## 2017-11-28 ENCOUNTER — Emergency Department (HOSPITAL_COMMUNITY)
Admission: EM | Admit: 2017-11-28 | Discharge: 2017-11-28 | Disposition: A | Discharge status: Home / Self Care | Payer: Self-pay | Attending: Emergency Medicine | Admitting: Emergency Medicine

## 2017-11-28 ENCOUNTER — Encounter (HOSPITAL_COMMUNITY): Payer: Self-pay | Admitting: Emergency Medicine

## 2017-11-28 ENCOUNTER — Other Ambulatory Visit: Payer: Self-pay

## 2017-11-28 DIAGNOSIS — Z23 Encounter for immunization: Secondary | ICD-10-CM | POA: Insufficient documentation

## 2017-11-28 DIAGNOSIS — Y92410 Unspecified street and highway as the place of occurrence of the external cause: Secondary | ICD-10-CM | POA: Insufficient documentation

## 2017-11-28 DIAGNOSIS — Y999 Unspecified external cause status: Secondary | ICD-10-CM | POA: Insufficient documentation

## 2017-11-28 DIAGNOSIS — F10920 Alcohol use, unspecified with intoxication, uncomplicated: Secondary | ICD-10-CM

## 2017-11-28 DIAGNOSIS — S301XXA Contusion of abdominal wall, initial encounter: Secondary | ICD-10-CM | POA: Insufficient documentation

## 2017-11-28 DIAGNOSIS — S91312A Laceration without foreign body, left foot, initial encounter: Secondary | ICD-10-CM

## 2017-11-28 DIAGNOSIS — S060X0A Concussion without loss of consciousness, initial encounter: Secondary | ICD-10-CM

## 2017-11-28 DIAGNOSIS — S8262XA Displaced fracture of lateral malleolus of left fibula, initial encounter for closed fracture: Secondary | ICD-10-CM | POA: Insufficient documentation

## 2017-11-28 DIAGNOSIS — S82892A Other fracture of left lower leg, initial encounter for closed fracture: Secondary | ICD-10-CM

## 2017-11-28 DIAGNOSIS — F10120 Alcohol abuse with intoxication, uncomplicated: Secondary | ICD-10-CM | POA: Insufficient documentation

## 2017-11-28 DIAGNOSIS — S161XXA Strain of muscle, fascia and tendon at neck level, initial encounter: Secondary | ICD-10-CM

## 2017-11-28 DIAGNOSIS — S3991XA Unspecified injury of abdomen, initial encounter: Secondary | ICD-10-CM

## 2017-11-28 DIAGNOSIS — Y939 Activity, unspecified: Secondary | ICD-10-CM | POA: Insufficient documentation

## 2017-11-28 LAB — I-STAT CHEM 8, ED
BUN: 9 mg/dL (ref 6–20)
CALCIUM ION: 1.06 mmol/L — AB (ref 1.15–1.40)
CREATININE: 1.3 mg/dL — AB (ref 0.44–1.00)
Chloride: 105 mmol/L (ref 101–111)
GLUCOSE: 135 mg/dL — AB (ref 65–99)
HCT: 40 % (ref 36.0–46.0)
HEMOGLOBIN: 13.6 g/dL (ref 12.0–15.0)
POTASSIUM: 3.8 mmol/L (ref 3.5–5.1)
Sodium: 140 mmol/L (ref 135–145)
TCO2: 21 mmol/L — AB (ref 22–32)

## 2017-11-28 LAB — COMPREHENSIVE METABOLIC PANEL
ALT: 18 U/L (ref 14–54)
ANION GAP: 15 (ref 5–15)
AST: 34 U/L (ref 15–41)
Albumin: 4.4 g/dL (ref 3.5–5.0)
Alkaline Phosphatase: 59 U/L (ref 38–126)
BUN: 9 mg/dL (ref 6–20)
CHLORIDE: 103 mmol/L (ref 101–111)
CO2: 19 mmol/L — ABNORMAL LOW (ref 22–32)
CREATININE: 0.9 mg/dL (ref 0.44–1.00)
Calcium: 9.2 mg/dL (ref 8.9–10.3)
Glucose, Bld: 137 mg/dL — ABNORMAL HIGH (ref 65–99)
Potassium: 3.8 mmol/L (ref 3.5–5.1)
Sodium: 137 mmol/L (ref 135–145)
Total Bilirubin: 0.4 mg/dL (ref 0.3–1.2)
Total Protein: 7.7 g/dL (ref 6.5–8.1)

## 2017-11-28 LAB — CBC
HCT: 34.7 % — ABNORMAL LOW (ref 36.0–46.0)
Hemoglobin: 11.1 g/dL — ABNORMAL LOW (ref 12.0–15.0)
MCH: 26.4 pg (ref 26.0–34.0)
MCHC: 32 g/dL (ref 30.0–36.0)
MCV: 82.6 fL (ref 78.0–100.0)
PLATELETS: 393 10*3/uL (ref 150–400)
RBC: 4.2 MIL/uL (ref 3.87–5.11)
RDW: 18.5 % — ABNORMAL HIGH (ref 11.5–15.5)
WBC: 8.3 10*3/uL (ref 4.0–10.5)

## 2017-11-28 LAB — SAMPLE TO BLOOD BANK

## 2017-11-28 LAB — CDS SEROLOGY

## 2017-11-28 LAB — PROTIME-INR
INR: 0.99
PROTHROMBIN TIME: 13 s (ref 11.4–15.2)

## 2017-11-28 LAB — I-STAT BETA HCG BLOOD, ED (MC, WL, AP ONLY)

## 2017-11-28 LAB — I-STAT CG4 LACTIC ACID, ED: Lactic Acid, Venous: 3.2 mmol/L (ref 0.5–1.9)

## 2017-11-28 LAB — ETHANOL: Alcohol, Ethyl (B): 337 mg/dL (ref <10)

## 2017-11-28 MED ORDER — CEFAZOLIN SODIUM-DEXTROSE 1-4 GM/50ML-% IV SOLN
1.0000 g | Freq: Once | INTRAVENOUS | Status: AC
  Administered 2017-11-28: 1 g via INTRAVENOUS
  Filled 2017-11-28: qty 50

## 2017-11-28 MED ORDER — IBUPROFEN 400 MG PO TABS: 400.0000 mg | ORAL_TABLET | Freq: Four times a day (QID) | ORAL | 0 refills | Status: DC | PRN

## 2017-11-28 MED ORDER — TETANUS-DIPHTH-ACELL PERTUSSIS 5-2.5-18.5 LF-MCG/0.5 IM SUSP
0.5000 mL | Freq: Once | INTRAMUSCULAR | Status: AC
  Administered 2017-11-28: 0.5 mL via INTRAMUSCULAR

## 2017-11-28 MED ORDER — FENTANYL CITRATE (PF) 100 MCG/2ML IJ SOLN
50.0000 ug | Freq: Once | INTRAMUSCULAR | Status: AC
  Administered 2017-11-28: 50 ug via INTRAVENOUS

## 2017-11-28 MED ORDER — HYDROCODONE-ACETAMINOPHEN 5-325 MG PO TABS: 1.0000 | ORAL_TABLET | Freq: Four times a day (QID) | ORAL | 0 refills | Status: DC | PRN

## 2017-11-28 MED ORDER — FENTANYL CITRATE (PF) 100 MCG/2ML IJ SOLN
INTRAMUSCULAR | Status: AC
  Filled 2017-11-28: qty 2

## 2017-11-28 MED ORDER — IOPAMIDOL (ISOVUE-300) INJECTION 61%
100.0000 mL | Freq: Once | INTRAVENOUS | Status: AC | PRN
  Administered 2017-11-28: 100 mL via INTRAVENOUS

## 2017-11-28 MED ORDER — IOPAMIDOL (ISOVUE-300) INJECTION 61%
INTRAVENOUS | Status: AC
  Filled 2017-11-28: qty 100

## 2017-11-28 MED ORDER — FENTANYL CITRATE (PF) 100 MCG/2ML IJ SOLN
100.0000 ug | Freq: Once | INTRAMUSCULAR | Status: AC
  Administered 2017-11-28: 100 ug via INTRAVENOUS
  Filled 2017-11-28: qty 2

## 2017-11-28 MED ORDER — TETANUS-DIPHTH-ACELL PERTUSSIS 5-2.5-18.5 LF-MCG/0.5 IM SUSP
INTRAMUSCULAR | Status: AC
  Filled 2017-11-28: qty 0.5

## 2017-11-28 MED ORDER — FENTANYL CITRATE (PF) 100 MCG/2ML IJ SOLN
50.0000 ug | Freq: Once | INTRAMUSCULAR | Status: AC
  Administered 2017-11-28: 50 ug via INTRAVENOUS
  Filled 2017-11-28: qty 2

## 2017-11-28 MED ORDER — SODIUM CHLORIDE 0.9 % IV BOLUS
2000.0000 mL | Freq: Once | INTRAVENOUS | Status: AC
  Administered 2017-11-28: 2000 mL via INTRAVENOUS
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
## 2017-11-28 NOTE — Discharge Instructions (Addendum)
Please keep wounds clean and dressed them every day. If the wounds become red, swollen, drainage please return to the ER

## 2017-11-28 NOTE — ED Notes (Signed)
Portable xrays being completed 

## 2017-11-28 NOTE — ED Notes (Addendum)
Portable x-rays completed.  [employer] at bedside to talk with pt

## 2017-11-28 NOTE — ED Notes (Signed)
Transported to CT 

## 2017-11-28 NOTE — ED Notes (Signed)
Boyfriend at bedside
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
## 2017-11-28 NOTE — ED Notes (Signed)
Ortho tech paged for CAM walker and crutches

## 2017-12-23 DIAGNOSIS — D649 Anemia, unspecified: Secondary | ICD-10-CM

## 2017-12-23 DIAGNOSIS — M869 Osteomyelitis, unspecified: Secondary | ICD-10-CM

## 2017-12-23 HISTORY — DX: Osteomyelitis, unspecified: M86.9

## 2017-12-23 HISTORY — DX: Anemia, unspecified: D64.9

## 2018-01-13 ENCOUNTER — Encounter (HOSPITAL_BASED_OUTPATIENT_CLINIC_OR_DEPARTMENT_OTHER): Payer: Self-pay | Admitting: *Deleted

## 2018-01-13 ENCOUNTER — Other Ambulatory Visit: Payer: Self-pay

## 2018-01-13 ENCOUNTER — Inpatient Hospital Stay (HOSPITAL_BASED_OUTPATIENT_CLINIC_OR_DEPARTMENT_OTHER)
Admission: EM | Admit: 2018-01-13 | Discharge: 2018-01-17 | DRG: 540 | Disposition: A | Discharge status: Home / Self Care | Payer: Self-pay | Attending: Internal Medicine | Admitting: Internal Medicine

## 2018-01-13 ENCOUNTER — Emergency Department (HOSPITAL_BASED_OUTPATIENT_CLINIC_OR_DEPARTMENT_OTHER): Payer: Self-pay

## 2018-01-13 DIAGNOSIS — M86072 Acute hematogenous osteomyelitis, left ankle and foot: Principal | ICD-10-CM | POA: Diagnosis present

## 2018-01-13 DIAGNOSIS — Y92410 Unspecified street and highway as the place of occurrence of the external cause: Secondary | ICD-10-CM

## 2018-01-13 DIAGNOSIS — S3210XD Unspecified fracture of sacrum, subsequent encounter for fracture with routine healing: Secondary | ICD-10-CM

## 2018-01-13 DIAGNOSIS — M869 Osteomyelitis, unspecified: Secondary | ICD-10-CM | POA: Diagnosis present

## 2018-01-13 DIAGNOSIS — M8600 Acute hematogenous osteomyelitis, unspecified site: Secondary | ICD-10-CM

## 2018-01-13 DIAGNOSIS — S8252XG Displaced fracture of medial malleolus of left tibia, subsequent encounter for closed fracture with delayed healing: Secondary | ICD-10-CM

## 2018-01-13 DIAGNOSIS — I10 Essential (primary) hypertension: Secondary | ICD-10-CM | POA: Diagnosis present

## 2018-01-13 DIAGNOSIS — L97321 Non-pressure chronic ulcer of left ankle limited to breakdown of skin: Secondary | ICD-10-CM | POA: Diagnosis present

## 2018-01-13 DIAGNOSIS — F17210 Nicotine dependence, cigarettes, uncomplicated: Secondary | ICD-10-CM | POA: Diagnosis present

## 2018-01-13 DIAGNOSIS — F10230 Alcohol dependence with withdrawal, uncomplicated: Secondary | ICD-10-CM | POA: Diagnosis present

## 2018-01-13 DIAGNOSIS — Y906 Blood alcohol level of 120-199 mg/100 ml: Secondary | ICD-10-CM | POA: Diagnosis present

## 2018-01-13 LAB — CBC WITH DIFFERENTIAL/PLATELET
Basophils Absolute: 0 10*3/uL (ref 0.0–0.1)
Basophils Relative: 0 %
EOS ABS: 0.1 10*3/uL (ref 0.0–0.7)
EOS PCT: 1 %
HCT: 34.9 % — ABNORMAL LOW (ref 36.0–46.0)
HEMOGLOBIN: 12 g/dL (ref 12.0–15.0)
LYMPHS ABS: 3 10*3/uL (ref 0.7–4.0)
Lymphocytes Relative: 36 %
MCH: 28.1 pg (ref 26.0–34.0)
MCHC: 34.4 g/dL (ref 30.0–36.0)
MCV: 81.7 fL (ref 78.0–100.0)
MONOS PCT: 8 %
Monocytes Absolute: 0.7 10*3/uL (ref 0.1–1.0)
NEUTROS PCT: 55 %
Neutro Abs: 4.5 10*3/uL (ref 1.7–7.7)
Platelets: 390 10*3/uL (ref 150–400)
RBC: 4.27 MIL/uL (ref 3.87–5.11)
RDW: 15.6 % — ABNORMAL HIGH (ref 11.5–15.5)
WBC: 8.3 10*3/uL (ref 4.0–10.5)

## 2018-01-13 MED ORDER — SODIUM CHLORIDE 0.9 % IV SOLN
1.0000 g | Freq: Once | INTRAVENOUS | Status: AC
  Administered 2018-01-14: 1 g via INTRAVENOUS
  Filled 2018-01-13: qty 10

## 2018-01-13 MED ORDER — SODIUM CHLORIDE 0.9 % IV BOLUS
1000.0000 mL | Freq: Once | INTRAVENOUS | Status: AC
  Administered 2018-01-14: 1000 mL via INTRAVENOUS

## 2018-01-13 NOTE — ED Notes (Signed)
Patient transported to X-ray 

## 2018-01-13 NOTE — ED Notes (Signed)
ETOH on board 
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
## 2018-01-13 NOTE — ED Notes (Signed)
ED Provider at bedside. 

## 2018-01-13 NOTE — ED Triage Notes (Signed)
Pt c/o left ankle wound redness and drainage  X 2 weeks.

## 2018-01-14 ENCOUNTER — Encounter (HOSPITAL_BASED_OUTPATIENT_CLINIC_OR_DEPARTMENT_OTHER): Payer: Self-pay | Admitting: Emergency Medicine

## 2018-01-14 ENCOUNTER — Inpatient Hospital Stay (HOSPITAL_COMMUNITY): Payer: Self-pay

## 2018-01-14 DIAGNOSIS — F10230 Alcohol dependence with withdrawal, uncomplicated: Secondary | ICD-10-CM

## 2018-01-14 DIAGNOSIS — M8600 Acute hematogenous osteomyelitis, unspecified site: Secondary | ICD-10-CM

## 2018-01-14 DIAGNOSIS — M869 Osteomyelitis, unspecified: Secondary | ICD-10-CM | POA: Diagnosis present

## 2018-01-14 LAB — HEPATIC FUNCTION PANEL
ALBUMIN: 4.4 g/dL (ref 3.5–5.0)
ALT: 17 U/L (ref 14–54)
AST: 23 U/L (ref 15–41)
Alkaline Phosphatase: 58 U/L (ref 38–126)
Total Bilirubin: <0.1 mg/dL — ABNORMAL LOW (ref 0.3–1.2)
Total Protein: 8.5 g/dL — ABNORMAL HIGH (ref 6.5–8.1)

## 2018-01-14 LAB — CBC
HCT: 31 % — ABNORMAL LOW (ref 36.0–46.0)
HEMOGLOBIN: 10.1 g/dL — AB (ref 12.0–15.0)
MCH: 26.9 pg (ref 26.0–34.0)
MCHC: 32.6 g/dL (ref 30.0–36.0)
MCV: 82.4 fL (ref 78.0–100.0)
Platelets: 357 10*3/uL (ref 150–400)
RBC: 3.76 MIL/uL — AB (ref 3.87–5.11)
RDW: 15.8 % — ABNORMAL HIGH (ref 11.5–15.5)
WBC: 9.2 10*3/uL (ref 4.0–10.5)

## 2018-01-14 LAB — URINALYSIS, MICROSCOPIC (REFLEX): BACTERIA UA: NONE SEEN

## 2018-01-14 LAB — BASIC METABOLIC PANEL
Anion gap: 10 (ref 5–15)
BUN: 9 mg/dL (ref 6–20)
CHLORIDE: 107 mmol/L (ref 101–111)
CO2: 21 mmol/L — ABNORMAL LOW (ref 22–32)
Calcium: 9.5 mg/dL (ref 8.9–10.3)
Creatinine, Ser: 0.63 mg/dL (ref 0.44–1.00)
GFR calc Af Amer: >60 mL/min (ref >60)
GFR calc non Af Amer: >60 mL/min (ref >60)
GLUCOSE: 100 mg/dL — AB (ref 65–99)
POTASSIUM: 3.9 mmol/L (ref 3.5–5.1)
Sodium: 138 mmol/L (ref 135–145)

## 2018-01-14 LAB — MAGNESIUM: MAGNESIUM: 1.7 mg/dL (ref 1.7–2.4)

## 2018-01-14 LAB — URINALYSIS, ROUTINE W REFLEX MICROSCOPIC
Bilirubin Urine: NEGATIVE
Glucose, UA: NEGATIVE mg/dL
Hgb urine dipstick: NEGATIVE
KETONES UR: NEGATIVE mg/dL
Nitrite: NEGATIVE
PROTEIN: NEGATIVE mg/dL
Specific Gravity, Urine: <1.005 — ABNORMAL LOW (ref 1.005–1.030)
pH: 5.5 (ref 5.0–8.0)

## 2018-01-14 LAB — HIV ANTIBODY (ROUTINE TESTING W REFLEX): HIV SCREEN 4TH GENERATION: NONREACTIVE

## 2018-01-14 LAB — I-STAT CG4 LACTIC ACID, ED: Lactic Acid, Venous: 1.47 mmol/L (ref 0.5–1.9)

## 2018-01-14 LAB — ETHANOL: Alcohol, Ethyl (B): 138 mg/dL — ABNORMAL HIGH (ref <10)

## 2018-01-14 LAB — CREATININE, SERUM: Creatinine, Ser: 0.7 mg/dL (ref 0.44–1.00)

## 2018-01-14 LAB — PREGNANCY, URINE: Preg Test, Ur: NEGATIVE

## 2018-01-14 LAB — MRSA PCR SCREENING: MRSA BY PCR: NEGATIVE

## 2018-01-14 MED ORDER — SODIUM CHLORIDE 0.9 % IV BOLUS (SEPSIS)
1000.0000 mL | Freq: Once | INTRAVENOUS | Status: AC
  Administered 2018-01-14: 1000 mL via INTRAVENOUS

## 2018-01-14 MED ORDER — GADOBENATE DIMEGLUMINE 529 MG/ML IV SOLN
15.0000 mL | Freq: Once | INTRAVENOUS | Status: AC | PRN
  Administered 2018-01-14: 14 mL via INTRAVENOUS

## 2018-01-14 MED ORDER — VANCOMYCIN HCL IN DEXTROSE 1-5 GM/200ML-% IV SOLN
1000.0000 mg | Freq: Two times a day (BID) | INTRAVENOUS | Status: DC
  Administered 2018-01-14 – 2018-01-17 (×7): 1000 mg via INTRAVENOUS
  Filled 2018-01-14 (×7): qty 200

## 2018-01-14 MED ORDER — ONDANSETRON HCL 4 MG/2ML IJ SOLN: 4.0000 mg | Freq: Four times a day (QID) | INTRAMUSCULAR | Status: DC | PRN

## 2018-01-14 MED ORDER — SODIUM CHLORIDE 0.9 % IV BOLUS (SEPSIS)
250.0000 mL | Freq: Once | INTRAVENOUS | Status: AC
  Administered 2018-01-14: 250 mL via INTRAVENOUS

## 2018-01-14 MED ORDER — LEVALBUTEROL HCL 0.63 MG/3ML IN NEBU: 0.6300 mg | INHALATION_SOLUTION | Freq: Four times a day (QID) | RESPIRATORY_TRACT | Status: DC | PRN

## 2018-01-14 MED ORDER — PIPERACILLIN-TAZOBACTAM 3.375 G IVPB 30 MIN
3.3750 g | Freq: Once | INTRAVENOUS | Status: AC
  Administered 2018-01-14: 3.375 g via INTRAVENOUS
  Filled 2018-01-14 (×2): qty 50

## 2018-01-14 MED ORDER — SODIUM CHLORIDE 0.9 % IV SOLN
2.0000 g | INTRAVENOUS | Status: DC
  Administered 2018-01-14 – 2018-01-17 (×4): 2 g via INTRAVENOUS
  Filled 2018-01-14 (×3): qty 20
  Filled 2018-01-14: qty 2
  Filled 2018-01-14: qty 20

## 2018-01-14 MED ORDER — ENOXAPARIN SODIUM 40 MG/0.4ML ~~LOC~~ SOLN
40.0000 mg | Freq: Every day | SUBCUTANEOUS | Status: DC
  Administered 2018-01-15 – 2018-01-17 (×3): 40 mg via SUBCUTANEOUS
  Filled 2018-01-14 (×4): qty 0.4

## 2018-01-14 MED ORDER — ACETAMINOPHEN 650 MG RE SUPP: 650.0000 mg | Freq: Four times a day (QID) | RECTAL | Status: DC | PRN

## 2018-01-14 MED ORDER — SODIUM CHLORIDE 0.9 % IV BOLUS (SEPSIS)
1000.0000 mL | Freq: Once | INTRAVENOUS | Status: AC
  Administered 2018-01-14 (×2): 1000 mL via INTRAVENOUS

## 2018-01-14 MED ORDER — ONDANSETRON HCL 4 MG PO TABS: 4.0000 mg | ORAL_TABLET | Freq: Four times a day (QID) | ORAL | Status: DC | PRN

## 2018-01-14 MED ORDER — VITAMIN B-1 100 MG PO TABS
100.0000 mg | ORAL_TABLET | Freq: Every day | ORAL | Status: DC
  Administered 2018-01-15 – 2018-01-17 (×3): 100 mg via ORAL
  Filled 2018-01-14 (×4): qty 1

## 2018-01-14 MED ORDER — ADULT MULTIVITAMIN W/MINERALS CH
1.0000 | ORAL_TABLET | Freq: Every day | ORAL | Status: DC
  Administered 2018-01-15 – 2018-01-17 (×3): 1 via ORAL
  Filled 2018-01-14 (×4): qty 1

## 2018-01-14 MED ORDER — VANCOMYCIN HCL IN DEXTROSE 1-5 GM/200ML-% IV SOLN
1000.0000 mg | Freq: Once | INTRAVENOUS | Status: AC
  Administered 2018-01-14: 1000 mg via INTRAVENOUS

## 2018-01-14 MED ORDER — THIAMINE HCL 100 MG/ML IJ SOLN
100.0000 mg | Freq: Every day | INTRAMUSCULAR | Status: DC
  Administered 2018-01-14: 100 mg via INTRAVENOUS
  Filled 2018-01-14: qty 2

## 2018-01-14 MED ORDER — ACETAMINOPHEN 325 MG PO TABS
650.0000 mg | ORAL_TABLET | Freq: Four times a day (QID) | ORAL | Status: DC | PRN
  Administered 2018-01-14 – 2018-01-16 (×4): 650 mg via ORAL
  Filled 2018-01-14 (×4): qty 2

## 2018-01-14 MED ORDER — MORPHINE SULFATE (PF) 2 MG/ML IV SOLN
2.0000 mg | Freq: Once | INTRAVENOUS | Status: AC
  Administered 2018-01-14: 2 mg via INTRAVENOUS
  Filled 2018-01-14: qty 1

## 2018-01-14 MED ORDER — VANCOMYCIN HCL IN DEXTROSE 1-5 GM/200ML-% IV SOLN
INTRAVENOUS | Status: AC
  Filled 2018-01-14: qty 200

## 2018-01-14 MED ORDER — ADULT MULTIVITAMIN W/MINERALS CH
1.0000 | ORAL_TABLET | Freq: Once | ORAL | Status: AC
  Administered 2018-01-14: 1 via ORAL
  Filled 2018-01-14: qty 1

## 2018-01-14 MED ORDER — VITAMIN B-1 100 MG PO TABS
500.0000 mg | ORAL_TABLET | Freq: Once | ORAL | Status: AC
  Administered 2018-01-14: 500 mg via ORAL
  Filled 2018-01-14: qty 5

## 2018-01-14 MED ORDER — FOLIC ACID 1 MG PO TABS
1.0000 mg | ORAL_TABLET | Freq: Every day | ORAL | Status: DC
  Administered 2018-01-15 – 2018-01-17 (×3): 1 mg via ORAL
  Filled 2018-01-14 (×4): qty 1

## 2018-01-14 MED ORDER — LORAZEPAM 1 MG PO TABS: 1.0000 mg | ORAL_TABLET | Freq: Four times a day (QID) | ORAL | Status: AC | PRN

## 2018-01-14 MED ORDER — LORAZEPAM 2 MG/ML IJ SOLN: 1.0000 mg | Freq: Four times a day (QID) | INTRAMUSCULAR | Status: AC | PRN

## 2018-01-14 NOTE — Progress Notes (Signed)
Notified [facility] admitting that Pt arrived on unit
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
## 2018-01-15 DIAGNOSIS — L97321 Non-pressure chronic ulcer of left ankle limited to breakdown of skin: Secondary | ICD-10-CM

## 2018-01-15 LAB — CBC
HCT: 31.4 % — ABNORMAL LOW (ref 36.0–46.0)
Hemoglobin: 10.2 g/dL — ABNORMAL LOW (ref 12.0–15.0)
MCH: 27.1 pg (ref 26.0–34.0)
MCHC: 32.5 g/dL (ref 30.0–36.0)
MCV: 83.5 fL (ref 78.0–100.0)
PLATELETS: 343 10*3/uL (ref 150–400)
RBC: 3.76 MIL/uL — ABNORMAL LOW (ref 3.87–5.11)
RDW: 15.4 % (ref 11.5–15.5)
WBC: 7.3 10*3/uL (ref 4.0–10.5)

## 2018-01-15 LAB — COMPREHENSIVE METABOLIC PANEL
ALT: 17 U/L (ref 14–54)
AST: 19 U/L (ref 15–41)
Albumin: 3.7 g/dL (ref 3.5–5.0)
Alkaline Phosphatase: 52 U/L (ref 38–126)
Anion gap: 9 (ref 5–15)
BUN: 10 mg/dL (ref 6–20)
CO2: 24 mmol/L (ref 22–32)
CREATININE: 0.72 mg/dL (ref 0.44–1.00)
Calcium: 9.3 mg/dL (ref 8.9–10.3)
Chloride: 103 mmol/L (ref 101–111)
GFR calc Af Amer: >60 mL/min (ref >60)
GFR calc non Af Amer: >60 mL/min (ref >60)
GLUCOSE: 83 mg/dL (ref 65–99)
Potassium: 4 mmol/L (ref 3.5–5.1)
SODIUM: 136 mmol/L (ref 135–145)
Total Bilirubin: 0.4 mg/dL (ref 0.3–1.2)
Total Protein: 7 g/dL (ref 6.5–8.1)

## 2018-01-15 LAB — BLOOD CULTURE ID PANEL (REFLEXED)
ACINETOBACTER BAUMANNII: NOT DETECTED
CANDIDA ALBICANS: NOT DETECTED
CANDIDA KRUSEI: NOT DETECTED
CANDIDA PARAPSILOSIS: NOT DETECTED
CANDIDA TROPICALIS: NOT DETECTED
Candida glabrata: NOT DETECTED
ESCHERICHIA COLI: NOT DETECTED
Enterobacter cloacae complex: NOT DETECTED
Enterobacteriaceae species: NOT DETECTED
Enterococcus species: NOT DETECTED
HAEMOPHILUS INFLUENZAE: NOT DETECTED
KLEBSIELLA OXYTOCA: NOT DETECTED
KLEBSIELLA PNEUMONIAE: NOT DETECTED
Listeria monocytogenes: NOT DETECTED
Methicillin resistance: NOT DETECTED
Neisseria meningitidis: NOT DETECTED
PSEUDOMONAS AERUGINOSA: NOT DETECTED
Proteus species: NOT DETECTED
SERRATIA MARCESCENS: NOT DETECTED
STREPTOCOCCUS PYOGENES: NOT DETECTED
Staphylococcus aureus (BCID): NOT DETECTED
Staphylococcus species: DETECTED — AB
Streptococcus agalactiae: NOT DETECTED
Streptococcus pneumoniae: NOT DETECTED
Streptococcus species: NOT DETECTED

## 2018-01-15 MED ORDER — SILVER SULFADIAZINE 1 % EX CREA
TOPICAL_CREAM | Freq: Every day | CUTANEOUS | Status: DC
  Administered 2018-01-15: 18:00:00 via TOPICAL
  Administered 2018-01-16: 1 via TOPICAL
  Administered 2018-01-17: 11:00:00 via TOPICAL
  Filled 2018-01-15: qty 85

## 2018-01-15 MED ORDER — SILVER SULFADIAZINE 1 % EX CREA: 1.0000 "application " | TOPICAL_CREAM | Freq: Every day | CUTANEOUS | 3 refills | Status: DC

## 2018-01-15 MED ORDER — LACTATED RINGERS IV SOLN
INTRAVENOUS | Status: DC
  Administered 2018-01-15: 08:00:00 via INTRAVENOUS

## 2018-01-15 MED ORDER — QUETIAPINE FUMARATE 100 MG PO TABS
100.0000 mg | ORAL_TABLET | Freq: Every day | ORAL | Status: DC
  Administered 2018-01-15 – 2018-01-16 (×2): 100 mg via ORAL
  Filled 2018-01-15 (×2): qty 1

## 2018-01-15 NOTE — Plan of Care (Signed)
  Problem: Education: Goal: Knowledge of General Education information will improve Outcome: Progressing   Problem: Clinical Measurements: Goal: Ability to maintain clinical measurements within normal limits will improve Outcome: Progressing   Problem: Clinical Measurements: Goal: Will remain free from infection Outcome: Progressing   Problem: Activity: Goal: Risk for activity intolerance will decrease Outcome: Progressing   Problem: Pain Managment: Goal: General experience of comfort will improve Outcome: Progressing   
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
## 2018-01-15 NOTE — Progress Notes (Addendum)
Patient seen and examined at bedside.  She has no new complaints.  Dr. Lajoyce Corners has been informed of her transfer to [facility].  Please refer to progress note dictated by Dr. Mahala Menghini on 01/15/2018 for further details of the assessment and plan.  Update: 1/2 blood cx positive for gram positive cocci. Suspect contaminate.

## 2018-01-15 NOTE — Progress Notes (Signed)
Pt stable for transport to [facility]. Full report given to accepting [facility] RN Greggory Stallion. [facility] notified that pt is ready for transport.
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
## 2018-01-16 DIAGNOSIS — T148XXA Other injury of unspecified body region, initial encounter: Secondary | ICD-10-CM

## 2018-01-16 DIAGNOSIS — L089 Local infection of the skin and subcutaneous tissue, unspecified: Secondary | ICD-10-CM

## 2018-01-16 LAB — BASIC METABOLIC PANEL
ANION GAP: 6 (ref 5–15)
BUN: 9 mg/dL (ref 6–20)
CHLORIDE: 106 mmol/L (ref 101–111)
CO2: 27 mmol/L (ref 22–32)
Calcium: 8.9 mg/dL (ref 8.9–10.3)
Creatinine, Ser: 0.83 mg/dL (ref 0.44–1.00)
GFR calc Af Amer: >60 mL/min (ref >60)
GLUCOSE: 97 mg/dL (ref 65–99)
POTASSIUM: 3.8 mmol/L (ref 3.5–5.1)
Sodium: 139 mmol/L (ref 135–145)

## 2018-01-16 LAB — CBC
HEMATOCRIT: 31.2 % — AB (ref 36.0–46.0)
HEMOGLOBIN: 9.9 g/dL — AB (ref 12.0–15.0)
MCH: 26.9 pg (ref 26.0–34.0)
MCHC: 31.7 g/dL (ref 30.0–36.0)
MCV: 84.8 fL (ref 78.0–100.0)
PLATELETS: 330 10*3/uL (ref 150–400)
RBC: 3.68 MIL/uL — AB (ref 3.87–5.11)
RDW: 15.5 % (ref 11.5–15.5)
WBC: 6.2 10*3/uL (ref 4.0–10.5)

## 2018-01-16 NOTE — Plan of Care (Signed)
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
## 2018-01-17 LAB — CULTURE, BLOOD (ROUTINE X 2): Special Requests: ADEQUATE

## 2018-01-17 MED ORDER — ACETAMINOPHEN 325 MG PO TABS: 650.0000 mg | ORAL_TABLET | Freq: Four times a day (QID) | ORAL | 0 refills | Status: DC | PRN

## 2018-01-17 MED ORDER — DOXYCYCLINE HYCLATE 100 MG PO TABS: 100.0000 mg | ORAL_TABLET | Freq: Two times a day (BID) | ORAL | 0 refills | Status: DC
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
## 2018-01-17 NOTE — Care Management (Signed)
Pt is uninsured, but is eligible for medication assistance through [facility] program. [facility] letter given with explanation of program benefits.  Pt appreciative of help.  Quintella Baton, RN, BSN  Trauma/Neuro ICU Case Manager [phone]

## 2018-01-17 NOTE — Plan of Care (Signed)
  Problem: Nutrition: Goal: Adequate nutrition will be maintained Outcome: Progressing   Problem: Elimination: Goal: Will not experience complications related to bowel motility Outcome: Progressing   Problem: Pain Managment: Goal: General experience of comfort will improve Outcome: Progressing   Problem: Safety: Goal: Ability to remain free from injury will improve Outcome: Progressing   

## 2018-01-17 NOTE — Progress Notes (Signed)
Provided discharge education/instructions, all questions and concerns addressed, discharged home with belongings accompanied by family. 

## 2018-01-19 LAB — CULTURE, BLOOD (ROUTINE X 2)
CULTURE: NO GROWTH
SPECIAL REQUESTS: ADEQUATE

## 2018-01-26 ENCOUNTER — Encounter (INDEPENDENT_AMBULATORY_CARE_PROVIDER_SITE_OTHER): Payer: Self-pay | Admitting: Orthopedic Surgery

## 2018-01-26 ENCOUNTER — Ambulatory Visit (INDEPENDENT_AMBULATORY_CARE_PROVIDER_SITE_OTHER): Payer: Self-pay | Admitting: Orthopedic Surgery

## 2018-01-26 DIAGNOSIS — L97321 Non-pressure chronic ulcer of left ankle limited to breakdown of skin: Secondary | ICD-10-CM
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
## 2019-08-07 ENCOUNTER — Emergency Department (HOSPITAL_BASED_OUTPATIENT_CLINIC_OR_DEPARTMENT_OTHER)
Admission: EM | Admit: 2019-08-07 | Discharge: 2019-08-07 | Disposition: A | Discharge status: Home / Self Care | Payer: Self-pay | Attending: Emergency Medicine | Admitting: Emergency Medicine

## 2019-08-07 ENCOUNTER — Emergency Department (HOSPITAL_BASED_OUTPATIENT_CLINIC_OR_DEPARTMENT_OTHER): Payer: Self-pay

## 2019-08-07 ENCOUNTER — Other Ambulatory Visit: Payer: Self-pay

## 2019-08-07 ENCOUNTER — Encounter (HOSPITAL_BASED_OUTPATIENT_CLINIC_OR_DEPARTMENT_OTHER): Payer: Self-pay | Admitting: *Deleted

## 2019-08-07 DIAGNOSIS — F17210 Nicotine dependence, cigarettes, uncomplicated: Secondary | ICD-10-CM | POA: Insufficient documentation

## 2019-08-07 DIAGNOSIS — R059 Cough, unspecified: Secondary | ICD-10-CM

## 2019-08-07 DIAGNOSIS — R05 Cough: Secondary | ICD-10-CM | POA: Insufficient documentation

## 2019-08-07 DIAGNOSIS — Z3202 Encounter for pregnancy test, result negative: Secondary | ICD-10-CM

## 2019-08-07 LAB — PREGNANCY, URINE: Preg Test, Ur: NEGATIVE

## 2019-08-07 MED ORDER — BENZONATATE 100 MG PO CAPS: 100.0000 mg | ORAL_CAPSULE | Freq: Three times a day (TID) | ORAL | 0 refills | Status: DC

## 2019-08-07 NOTE — ED Triage Notes (Addendum)
Pt reports cough for "months". Now she is coughing all the time. States sometimes it's productive, sometimes not. She adds she also needs a pregnancy test because "her stuff is all messed up"
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
## 2019-08-07 NOTE — Discharge Instructions (Signed)
Your chest x-ray was normal with no signs of infection. Your urine pregnancy test was negative. I am sending you home with cough medication. Take as prescribed. Your chronic cough may be due to smoking. I would try to cut back and eventually quit. Follow-up with your PCP if your symptoms do not improve. Return to the ER for new or worsening symptoms.

## 2019-08-07 NOTE — ED Notes (Signed)
ED Provider at bedside. 

## 2020-01-24 ENCOUNTER — Telehealth (HOSPITAL_COMMUNITY): Payer: Self-pay | Admitting: Psychiatry

## 2020-03-25 DIAGNOSIS — K759 Inflammatory liver disease, unspecified: Secondary | ICD-10-CM

## 2020-03-25 DIAGNOSIS — D689 Coagulation defect, unspecified: Secondary | ICD-10-CM

## 2020-03-25 DIAGNOSIS — F1910 Other psychoactive substance abuse, uncomplicated: Secondary | ICD-10-CM

## 2020-03-25 DIAGNOSIS — D696 Thrombocytopenia, unspecified: Secondary | ICD-10-CM

## 2020-03-25 HISTORY — DX: Coagulation defect, unspecified: D68.9

## 2020-03-25 HISTORY — DX: Inflammatory liver disease, unspecified: K75.9

## 2020-03-25 HISTORY — DX: Other psychoactive substance abuse, uncomplicated: F19.10

## 2020-03-25 HISTORY — DX: Thrombocytopenia, unspecified: D69.6

## 2020-04-04 ENCOUNTER — Encounter (HOSPITAL_BASED_OUTPATIENT_CLINIC_OR_DEPARTMENT_OTHER): Payer: Self-pay | Admitting: Emergency Medicine

## 2020-04-04 ENCOUNTER — Emergency Department (HOSPITAL_BASED_OUTPATIENT_CLINIC_OR_DEPARTMENT_OTHER): Payer: Medicaid Other

## 2020-04-04 ENCOUNTER — Other Ambulatory Visit: Payer: Self-pay

## 2020-04-04 ENCOUNTER — Inpatient Hospital Stay (HOSPITAL_BASED_OUTPATIENT_CLINIC_OR_DEPARTMENT_OTHER)
Admission: EM | Admit: 2020-04-04 | Discharge: 2020-05-01 | DRG: 441 | Disposition: A | Discharge status: Home / Self Care | Payer: Medicaid Other | Attending: Internal Medicine | Admitting: Internal Medicine

## 2020-04-04 DIAGNOSIS — J9811 Atelectasis: Secondary | ICD-10-CM | POA: Diagnosis present

## 2020-04-04 DIAGNOSIS — R6510 Systemic inflammatory response syndrome (SIRS) of non-infectious origin without acute organ dysfunction: Secondary | ICD-10-CM | POA: Diagnosis present

## 2020-04-04 DIAGNOSIS — Z20822 Contact with and (suspected) exposure to covid-19: Secondary | ICD-10-CM | POA: Diagnosis present

## 2020-04-04 DIAGNOSIS — F17210 Nicotine dependence, cigarettes, uncomplicated: Secondary | ICD-10-CM | POA: Diagnosis present

## 2020-04-04 DIAGNOSIS — E876 Hypokalemia: Secondary | ICD-10-CM | POA: Diagnosis present

## 2020-04-04 DIAGNOSIS — Y903 Blood alcohol level of 60-79 mg/100 ml: Secondary | ICD-10-CM | POA: Diagnosis present

## 2020-04-04 DIAGNOSIS — K0889 Other specified disorders of teeth and supporting structures: Secondary | ICD-10-CM | POA: Diagnosis present

## 2020-04-04 DIAGNOSIS — R739 Hyperglycemia, unspecified: Secondary | ICD-10-CM | POA: Diagnosis present

## 2020-04-04 DIAGNOSIS — N179 Acute kidney failure, unspecified: Secondary | ICD-10-CM | POA: Diagnosis present

## 2020-04-04 DIAGNOSIS — R17 Unspecified jaundice: Secondary | ICD-10-CM

## 2020-04-04 DIAGNOSIS — K7200 Acute and subacute hepatic failure without coma: Secondary | ICD-10-CM | POA: Diagnosis present

## 2020-04-04 DIAGNOSIS — E440 Moderate protein-calorie malnutrition: Secondary | ICD-10-CM | POA: Diagnosis present

## 2020-04-04 DIAGNOSIS — J90 Pleural effusion, not elsewhere classified: Secondary | ICD-10-CM | POA: Diagnosis present

## 2020-04-04 DIAGNOSIS — E861 Hypovolemia: Secondary | ICD-10-CM | POA: Diagnosis present

## 2020-04-04 DIAGNOSIS — D689 Coagulation defect, unspecified: Secondary | ICD-10-CM

## 2020-04-04 DIAGNOSIS — D696 Thrombocytopenia, unspecified: Secondary | ICD-10-CM | POA: Diagnosis not present

## 2020-04-04 DIAGNOSIS — R54 Age-related physical debility: Secondary | ICD-10-CM | POA: Diagnosis present

## 2020-04-04 DIAGNOSIS — D6959 Other secondary thrombocytopenia: Secondary | ICD-10-CM | POA: Diagnosis present

## 2020-04-04 DIAGNOSIS — R7989 Other specified abnormal findings of blood chemistry: Secondary | ICD-10-CM

## 2020-04-04 DIAGNOSIS — Z6823 Body mass index (BMI) 23.0-23.9, adult: Secondary | ICD-10-CM

## 2020-04-04 DIAGNOSIS — J9601 Acute respiratory failure with hypoxia: Secondary | ICD-10-CM

## 2020-04-04 DIAGNOSIS — B179 Acute viral hepatitis, unspecified: Secondary | ICD-10-CM | POA: Diagnosis not present

## 2020-04-04 DIAGNOSIS — D62 Acute posthemorrhagic anemia: Secondary | ICD-10-CM | POA: Diagnosis present

## 2020-04-04 DIAGNOSIS — K7201 Acute and subacute hepatic failure with coma: Secondary | ICD-10-CM | POA: Diagnosis not present

## 2020-04-04 DIAGNOSIS — T82838A Hemorrhage of vascular prosthetic devices, implants and grafts, initial encounter: Secondary | ICD-10-CM | POA: Diagnosis not present

## 2020-04-04 DIAGNOSIS — K920 Hematemesis: Secondary | ICD-10-CM | POA: Diagnosis present

## 2020-04-04 DIAGNOSIS — F1290 Cannabis use, unspecified, uncomplicated: Secondary | ICD-10-CM | POA: Diagnosis present

## 2020-04-04 DIAGNOSIS — G9341 Metabolic encephalopathy: Secondary | ICD-10-CM | POA: Diagnosis present

## 2020-04-04 DIAGNOSIS — D684 Acquired coagulation factor deficiency: Secondary | ICD-10-CM | POA: Diagnosis present

## 2020-04-04 DIAGNOSIS — K7290 Hepatic failure, unspecified without coma: Secondary | ICD-10-CM

## 2020-04-04 DIAGNOSIS — F1910 Other psychoactive substance abuse, uncomplicated: Secondary | ICD-10-CM

## 2020-04-04 DIAGNOSIS — R1310 Dysphagia, unspecified: Secondary | ICD-10-CM | POA: Diagnosis present

## 2020-04-04 DIAGNOSIS — D649 Anemia, unspecified: Secondary | ICD-10-CM | POA: Diagnosis present

## 2020-04-04 DIAGNOSIS — K2971 Gastritis, unspecified, with bleeding: Secondary | ICD-10-CM | POA: Diagnosis present

## 2020-04-04 DIAGNOSIS — Z01818 Encounter for other preprocedural examination: Secondary | ICD-10-CM

## 2020-04-04 DIAGNOSIS — J9600 Acute respiratory failure, unspecified whether with hypoxia or hypercapnia: Secondary | ICD-10-CM

## 2020-04-04 DIAGNOSIS — Z79899 Other long term (current) drug therapy: Secondary | ICD-10-CM

## 2020-04-04 DIAGNOSIS — F1410 Cocaine abuse, uncomplicated: Secondary | ICD-10-CM | POA: Diagnosis not present

## 2020-04-04 DIAGNOSIS — H1133 Conjunctival hemorrhage, bilateral: Secondary | ICD-10-CM | POA: Diagnosis present

## 2020-04-04 DIAGNOSIS — W449XXA Unspecified foreign body entering into or through a natural orifice, initial encounter: Secondary | ICD-10-CM

## 2020-04-04 DIAGNOSIS — K2970 Gastritis, unspecified, without bleeding: Secondary | ICD-10-CM | POA: Diagnosis present

## 2020-04-04 DIAGNOSIS — F1010 Alcohol abuse, uncomplicated: Secondary | ICD-10-CM

## 2020-04-04 DIAGNOSIS — F10239 Alcohol dependence with withdrawal, unspecified: Secondary | ICD-10-CM | POA: Diagnosis present

## 2020-04-04 DIAGNOSIS — K7011 Alcoholic hepatitis with ascites: Secondary | ICD-10-CM | POA: Diagnosis present

## 2020-04-04 DIAGNOSIS — F1210 Cannabis abuse, uncomplicated: Secondary | ICD-10-CM | POA: Diagnosis not present

## 2020-04-04 DIAGNOSIS — E162 Hypoglycemia, unspecified: Secondary | ICD-10-CM | POA: Diagnosis present

## 2020-04-04 DIAGNOSIS — Y838 Other surgical procedures as the cause of abnormal reaction of the patient, or of later complication, without mention of misadventure at the time of the procedure: Secondary | ICD-10-CM | POA: Diagnosis not present

## 2020-04-04 DIAGNOSIS — R1110 Vomiting, unspecified: Secondary | ICD-10-CM

## 2020-04-04 DIAGNOSIS — T17800A Unspecified foreign body in other parts of respiratory tract causing asphyxiation, initial encounter: Secondary | ICD-10-CM

## 2020-04-04 DIAGNOSIS — K7031 Alcoholic cirrhosis of liver with ascites: Secondary | ICD-10-CM

## 2020-04-04 DIAGNOSIS — F1011 Alcohol abuse, in remission: Secondary | ICD-10-CM

## 2020-04-04 DIAGNOSIS — E870 Hyperosmolality and hypernatremia: Secondary | ICD-10-CM | POA: Diagnosis present

## 2020-04-04 DIAGNOSIS — N390 Urinary tract infection, site not specified: Secondary | ICD-10-CM | POA: Diagnosis present

## 2020-04-04 DIAGNOSIS — E871 Hypo-osmolality and hyponatremia: Secondary | ICD-10-CM | POA: Diagnosis present

## 2020-04-04 DIAGNOSIS — K759 Inflammatory liver disease, unspecified: Secondary | ICD-10-CM

## 2020-04-04 DIAGNOSIS — F329 Major depressive disorder, single episode, unspecified: Secondary | ICD-10-CM | POA: Diagnosis present

## 2020-04-04 DIAGNOSIS — Z4659 Encounter for fitting and adjustment of other gastrointestinal appliance and device: Secondary | ICD-10-CM

## 2020-04-04 DIAGNOSIS — F99 Mental disorder, not otherwise specified: Secondary | ICD-10-CM | POA: Diagnosis not present

## 2020-04-04 DIAGNOSIS — K567 Ileus, unspecified: Secondary | ICD-10-CM

## 2020-04-04 DIAGNOSIS — F1490 Cocaine use, unspecified, uncomplicated: Secondary | ICD-10-CM | POA: Diagnosis present

## 2020-04-04 DIAGNOSIS — F1110 Opioid abuse, uncomplicated: Secondary | ICD-10-CM | POA: Diagnosis not present

## 2020-04-04 DIAGNOSIS — R569 Unspecified convulsions: Secondary | ICD-10-CM | POA: Diagnosis not present

## 2020-04-04 LAB — CBC
HCT: 34.5 % — ABNORMAL LOW (ref 36.0–46.0)
HCT: 29.6 % — ABNORMAL LOW (ref 36.0–46.0)
Hemoglobin: 9.9 g/dL — ABNORMAL LOW (ref 12.0–15.0)
Hemoglobin: 8.5 g/dL — ABNORMAL LOW (ref 12.0–15.0)
MCH: 25.9 pg — ABNORMAL LOW (ref 26.0–34.0)
MCH: 25.9 pg — ABNORMAL LOW (ref 26.0–34.0)
MCHC: 28.7 g/dL — ABNORMAL LOW (ref 30.0–36.0)
MCHC: 28.7 g/dL — ABNORMAL LOW (ref 30.0–36.0)
MCV: 90.3 fL (ref 80.0–100.0)
MCV: 90.2 fL (ref 80.0–100.0)
Platelets: 40 10*3/uL — ABNORMAL LOW (ref 150–400)
Platelets: 48 10*3/uL — ABNORMAL LOW (ref 150–400)
RBC: 3.82 MIL/uL — ABNORMAL LOW (ref 3.87–5.11)
RBC: 3.28 MIL/uL — ABNORMAL LOW (ref 3.87–5.11)
RDW: 18.2 % — ABNORMAL HIGH (ref 11.5–15.5)
RDW: 18.4 % — ABNORMAL HIGH (ref 11.5–15.5)
WBC: 11.5 10*3/uL — ABNORMAL HIGH (ref 4.0–10.5)
WBC: 12 10*3/uL — ABNORMAL HIGH (ref 4.0–10.5)
nRBC: 0 % (ref 0.0–0.2)
nRBC: 0.2 % (ref 0.0–0.2)

## 2020-04-04 LAB — GLUCOSE, CAPILLARY: Glucose-Capillary: 106 mg/dL — ABNORMAL HIGH (ref 70–99)

## 2020-04-04 LAB — COMPREHENSIVE METABOLIC PANEL
ALT: 4566 U/L — ABNORMAL HIGH (ref 0–44)
ALT: 5416 U/L — ABNORMAL HIGH (ref 0–44)
AST: >10000 U/L — ABNORMAL HIGH (ref 15–41)
AST: >10000 U/L — ABNORMAL HIGH (ref 15–41)
Albumin: 3.3 g/dL — ABNORMAL LOW (ref 3.5–5.0)
Albumin: 3.1 g/dL — ABNORMAL LOW (ref 3.5–5.0)
Alkaline Phosphatase: 147 U/L — ABNORMAL HIGH (ref 38–126)
Alkaline Phosphatase: 142 U/L — ABNORMAL HIGH (ref 38–126)
Anion gap: 31 — ABNORMAL HIGH (ref 5–15)
Anion gap: 21 — ABNORMAL HIGH (ref 5–15)
BUN: 13 mg/dL (ref 6–20)
BUN: 12 mg/dL (ref 6–20)
CO2: 9 mmol/L — ABNORMAL LOW (ref 22–32)
CO2: 12 mmol/L — ABNORMAL LOW (ref 22–32)
Calcium: 8.5 mg/dL — ABNORMAL LOW (ref 8.9–10.3)
Calcium: 7.6 mg/dL — ABNORMAL LOW (ref 8.9–10.3)
Chloride: 92 mmol/L — ABNORMAL LOW (ref 98–111)
Chloride: 101 mmol/L (ref 98–111)
Creatinine, Ser: 1.2 mg/dL — ABNORMAL HIGH (ref 0.44–1.00)
Creatinine, Ser: 1.4 mg/dL — ABNORMAL HIGH (ref 0.44–1.00)
GFR calc Af Amer: >60 mL/min (ref >60)
GFR calc Af Amer: 59 mL/min — ABNORMAL LOW (ref >60)
GFR calc non Af Amer: >60 mL/min (ref >60)
GFR calc non Af Amer: 51 mL/min — ABNORMAL LOW (ref >60)
Glucose, Bld: 102 mg/dL — ABNORMAL HIGH (ref 70–99)
Glucose, Bld: 113 mg/dL — ABNORMAL HIGH (ref 70–99)
Potassium: 3.6 mmol/L (ref 3.5–5.1)
Potassium: 5.2 mmol/L — ABNORMAL HIGH (ref 3.5–5.1)
Sodium: 132 mmol/L — ABNORMAL LOW (ref 135–145)
Sodium: 134 mmol/L — ABNORMAL LOW (ref 135–145)
Total Bilirubin: 4.8 mg/dL — ABNORMAL HIGH (ref 0.3–1.2)
Total Bilirubin: 5.5 mg/dL — ABNORMAL HIGH (ref 0.3–1.2)
Total Protein: 6.3 g/dL — ABNORMAL LOW (ref 6.5–8.1)
Total Protein: 5.9 g/dL — ABNORMAL LOW (ref 6.5–8.1)

## 2020-04-04 LAB — URINALYSIS, MICROSCOPIC (REFLEX)

## 2020-04-04 LAB — URINALYSIS, ROUTINE W REFLEX MICROSCOPIC
Glucose, UA: NEGATIVE mg/dL
Ketones, ur: NEGATIVE mg/dL
Leukocytes,Ua: NEGATIVE
Nitrite: POSITIVE — AB
Protein, ur: 100 mg/dL — AB
Specific Gravity, Urine: >1.03 — ABNORMAL HIGH (ref 1.005–1.030)
pH: 5.5 (ref 5.0–8.0)

## 2020-04-04 LAB — LIPASE, BLOOD: Lipase: 22 U/L (ref 11–51)

## 2020-04-04 LAB — CBG MONITORING, ED
Glucose-Capillary: 123 mg/dL — ABNORMAL HIGH (ref 70–99)
Glucose-Capillary: 112 mg/dL — ABNORMAL HIGH (ref 70–99)
Glucose-Capillary: 192 mg/dL — ABNORMAL HIGH (ref 70–99)
Glucose-Capillary: 25 mg/dL — CL (ref 70–99)
Glucose-Capillary: 149 mg/dL — ABNORMAL HIGH (ref 70–99)
Glucose-Capillary: 129 mg/dL — ABNORMAL HIGH (ref 70–99)

## 2020-04-04 LAB — ETHANOL: Alcohol, Ethyl (B): 61 mg/dL — ABNORMAL HIGH (ref <10)

## 2020-04-04 LAB — PHOSPHORUS: Phosphorus: 4 mg/dL (ref 2.5–4.6)

## 2020-04-04 LAB — MAGNESIUM: Magnesium: 2 mg/dL (ref 1.7–2.4)

## 2020-04-04 LAB — RAPID URINE DRUG SCREEN, HOSP PERFORMED
Amphetamines: NOT DETECTED
Barbiturates: NOT DETECTED
Benzodiazepines: NOT DETECTED
Cocaine: POSITIVE — AB
Opiates: POSITIVE — AB
Tetrahydrocannabinol: POSITIVE — AB

## 2020-04-04 LAB — HEPATITIS PANEL, ACUTE
HCV Ab: NONREACTIVE
Hep A IgM: NONREACTIVE
Hep B C IgM: NONREACTIVE
Hepatitis B Surface Ag: NONREACTIVE

## 2020-04-04 LAB — SARS CORONAVIRUS 2 BY RT PCR (HOSPITAL ORDER, PERFORMED IN ~~LOC~~ HOSPITAL LAB): SARS Coronavirus 2: NEGATIVE

## 2020-04-04 LAB — LACTIC ACID, PLASMA: Lactic Acid, Venous: >11 mmol/L (ref 0.5–1.9)

## 2020-04-04 LAB — CK: Total CK: 103 U/L (ref 38–234)

## 2020-04-04 LAB — HIV ANTIBODY (ROUTINE TESTING W REFLEX): HIV Screen 4th Generation wRfx: NONREACTIVE

## 2020-04-04 LAB — AMMONIA: Ammonia: 63 umol/L — ABNORMAL HIGH (ref 9–35)

## 2020-04-04 LAB — PROTIME-INR
INR: 9.9 (ref 0.8–1.2)
Prothrombin Time: 77 seconds — ABNORMAL HIGH (ref 11.4–15.2)

## 2020-04-04 LAB — PREGNANCY, URINE: Preg Test, Ur: NEGATIVE

## 2020-04-04 LAB — ACETAMINOPHEN LEVEL: Acetaminophen (Tylenol), Serum: <10 ug/mL — ABNORMAL LOW (ref 10–30)

## 2020-04-04 MED ORDER — DEXTROSE 5 % IV SOLN
15.0000 mg/kg/h | INTRAVENOUS | Status: DC
  Administered 2020-04-04 – 2020-04-05 (×2): 15 mg/kg/h via INTRAVENOUS
  Filled 2020-04-04 (×2): qty 120

## 2020-04-04 MED ORDER — ONDANSETRON HCL 4 MG/2ML IJ SOLN
4.0000 mg | Freq: Once | INTRAMUSCULAR | Status: AC
  Administered 2020-04-04: 4 mg via INTRAVENOUS
  Filled 2020-04-04: qty 2

## 2020-04-04 MED ORDER — SODIUM CHLORIDE 0.9% FLUSH
3.0000 mL | Freq: Two times a day (BID) | INTRAVENOUS | Status: DC
  Administered 2020-04-05 – 2020-04-14 (×20): 3 mL via INTRAVENOUS
  Filled 2020-04-04: qty 3

## 2020-04-04 MED ORDER — FOLIC ACID 1 MG PO TABS: 1.0000 mg | ORAL_TABLET | Freq: Every day | ORAL | Status: DC

## 2020-04-04 MED ORDER — THIAMINE HCL 100 MG/ML IJ SOLN
100.0000 mg | Freq: Every day | INTRAMUSCULAR | Status: DC
  Administered 2020-04-04 – 2020-04-13 (×10): 100 mg via INTRAVENOUS
  Filled 2020-04-04 (×9): qty 2

## 2020-04-04 MED ORDER — PREDNISONE 20 MG PO TABS
40.0000 mg | ORAL_TABLET | Freq: Every day | ORAL | Status: DC
  Administered 2020-04-04: 40 mg via ORAL
  Filled 2020-04-04: qty 2

## 2020-04-04 MED ORDER — QUETIAPINE FUMARATE 100 MG PO TABS: 100.0000 mg | ORAL_TABLET | Freq: Every day | ORAL | Status: DC

## 2020-04-04 MED ORDER — LORAZEPAM 2 MG/ML IJ SOLN
1.0000 mg | INTRAMUSCULAR | Status: DC | PRN
  Administered 2020-04-04 – 2020-04-05 (×2): 1 mg via INTRAVENOUS
  Filled 2020-04-04 (×2): qty 1

## 2020-04-04 MED ORDER — MORPHINE SULFATE (PF) 4 MG/ML IV SOLN
4.0000 mg | Freq: Once | INTRAVENOUS | Status: AC
  Administered 2020-04-04: 4 mg via INTRAVENOUS
  Filled 2020-04-04: qty 1

## 2020-04-04 MED ORDER — POLYETHYLENE GLYCOL 3350 17 G PO PACK: 17.0000 g | PACK | Freq: Every day | ORAL | Status: DC | PRN

## 2020-04-04 MED ORDER — VITAMIN K1 10 MG/ML IJ SOLN
10.0000 mg | Freq: Once | INTRAVENOUS | Status: AC
  Administered 2020-04-04: 10 mg via INTRAVENOUS
  Filled 2020-04-04: qty 1

## 2020-04-04 MED ORDER — SODIUM CHLORIDE 0.9 % IV SOLN
250.0000 mL | INTRAVENOUS | Status: DC | PRN
  Administered 2020-04-05: 250 mL via INTRAVENOUS

## 2020-04-04 MED ORDER — SODIUM CHLORIDE 0.9 % IV BOLUS
1000.0000 mL | Freq: Once | INTRAVENOUS | Status: AC
  Administered 2020-04-04: 1000 mL via INTRAVENOUS

## 2020-04-04 MED ORDER — SODIUM CHLORIDE 0.9% FLUSH
3.0000 mL | INTRAVENOUS | Status: DC | PRN
  Filled 2020-04-04: qty 3

## 2020-04-04 MED ORDER — IOHEXOL 300 MG/ML  SOLN
100.0000 mL | Freq: Once | INTRAMUSCULAR | Status: AC | PRN
  Administered 2020-04-04: 100 mL via INTRAVENOUS

## 2020-04-04 MED ORDER — FAMOTIDINE IN NACL 20-0.9 MG/50ML-% IV SOLN
20.0000 mg | Freq: Once | INTRAVENOUS | Status: AC
  Administered 2020-04-04: 20 mg via INTRAVENOUS
  Filled 2020-04-04: qty 50

## 2020-04-04 MED ORDER — ACETYLCYSTEINE LOAD VIA INFUSION
150.0000 mg/kg | Freq: Once | INTRAVENOUS | Status: AC
  Administered 2020-04-04: 10005 mg via INTRAVENOUS
  Filled 2020-04-04: qty 251

## 2020-04-04 MED ORDER — OXYCODONE HCL 5 MG PO TABS: 5.0000 mg | ORAL_TABLET | ORAL | Status: DC | PRN

## 2020-04-04 MED ORDER — THIAMINE HCL 100 MG PO TABS: 100.0000 mg | ORAL_TABLET | Freq: Every day | ORAL | Status: DC

## 2020-04-04 MED ORDER — ADULT MULTIVITAMIN W/MINERALS CH
1.0000 | ORAL_TABLET | Freq: Every day | ORAL | Status: DC
  Administered 2020-04-07 – 2020-04-08 (×2): 1 via ORAL
  Filled 2020-04-04 (×3): qty 1

## 2020-04-04 MED ORDER — VITAMIN K1 10 MG/ML IJ SOLN
INTRAMUSCULAR | Status: AC
  Filled 2020-04-04: qty 1

## 2020-04-04 MED ORDER — PROMETHAZINE HCL 25 MG/ML IJ SOLN: 6.2500 mg | Freq: Four times a day (QID) | INTRAMUSCULAR | Status: DC | PRN

## 2020-04-04 MED ORDER — THIAMINE HCL 100 MG/ML IJ SOLN: 100.0000 mg | Freq: Every day | INTRAMUSCULAR | Status: DC

## 2020-04-04 MED ORDER — DEXTROSE 50 % IV SOLN
1.0000 | Freq: Once | INTRAVENOUS | Status: AC
  Administered 2020-04-04: 50 mL via INTRAVENOUS
  Filled 2020-04-04: qty 50

## 2020-04-04 MED ORDER — PROMETHAZINE HCL 25 MG RE SUPP: 12.5000 mg | Freq: Four times a day (QID) | RECTAL | Status: DC | PRN

## 2020-04-04 MED ORDER — PROMETHAZINE HCL 25 MG PO TABS: 12.5000 mg | ORAL_TABLET | Freq: Four times a day (QID) | ORAL | Status: DC | PRN

## 2020-04-04 MED ORDER — ONDANSETRON HCL 4 MG PO TABS: 4.0000 mg | ORAL_TABLET | Freq: Four times a day (QID) | ORAL | Status: DC | PRN

## 2020-04-04 MED ORDER — MORPHINE SULFATE (PF) 4 MG/ML IV SOLN: 4.0000 mg | INTRAVENOUS | Status: DC | PRN

## 2020-04-04 MED ORDER — LORAZEPAM 1 MG PO TABS: 1.0000 mg | ORAL_TABLET | ORAL | Status: DC | PRN

## 2020-04-04 MED ORDER — ONDANSETRON HCL 4 MG/2ML IJ SOLN
4.0000 mg | Freq: Four times a day (QID) | INTRAMUSCULAR | Status: DC | PRN
  Administered 2020-04-04 – 2020-04-17 (×9): 4 mg via INTRAVENOUS
  Filled 2020-04-04 (×9): qty 2

## 2020-04-04 MED ORDER — FOLIC ACID 5 MG/ML IJ SOLN
1.0000 mg | Freq: Every day | INTRAMUSCULAR | Status: DC
  Administered 2020-04-04 – 2020-04-13 (×10): 1 mg via INTRAVENOUS
  Filled 2020-04-04 (×11): qty 0.2

## 2020-04-04 MED ORDER — PREDNISOLONE 5 MG PO TABS
40.0000 mg | ORAL_TABLET | Freq: Every day | ORAL | Status: DC
  Filled 2020-04-04: qty 8

## 2020-04-04 MED ORDER — ACETYLCYSTEINE 200 MG/ML IV SOLN
INTRAVENOUS | Status: AC
  Filled 2020-04-04: qty 120

## 2020-04-04 NOTE — Progress Notes (Signed)
CRITICAL VALUE ALERT  Critical Value:  Lactic Acid > 11.0  Date & Time Notied:  04/04/2020 1828  Provider Notified: Radonna Ricker, MD  Orders Received/Actions taken: no new orders given

## 2020-04-04 NOTE — ED Triage Notes (Signed)
Pt brought in by EMS for c/o emesis and hypoglycemia  Pt called EMS tonight for c/o vomiting  EMS checked a CBG upon arrival and the monitor read low, repeated test and read low again  IV was started and 25 of D10 was given   Last CBG was 128  Pt is alert and oriented up on arrival  Pt was orthostatic on scene  Pt has not been eating for the past 2 days due to tooth pain from her wisdom teeth  Vomiting started last night and into this morning

## 2020-04-04 NOTE — Progress Notes (Addendum)
BP (!) 100/36   Pulse (!) 114   Temp (!) 97.4 F (36.3 C) (Oral)   Resp (!) 22   Ht 5\' 6"  (1.676 m)   Wt 66.7 kg   LMP 03/25/2020 (Exact Date)   SpO2 98%   BMI 23.34 kg/m   29 year old with past medical history of significant alcohol abuse and polysubstance abuse that comes to the ED as she was not feeling well with nausea and vomiting for the last several days, she relates she receives a Covid vaccine on August 2021, she was also complaining of some abdominal pain, she also has been having some tooth pain and she related she has been taking Tylenol for only 1 to 2 days when she called EMS she was found to be hypoglycemic and throwing up.  The ED she was found to be mildly tachycardic and borderline hypotensive she was started on normal saline, was found to be hyponatremic hypochloremic with a CO2 of 9 and an anion gap of 31, creatinine of 1.2 (with a baseline creatinine of 0.6), elevated alkaline phosphatase at 147 AST of greater than 10,000 and ALT greater than 4000.  With a mild leukocytosis with a left shift of 11.5, hemoglobin of 9.9 platelet count of 40, her Tylenol level is less than 10 SARS-CoV-2 was negative for specific gravity and the urinalysis is 1030, with small traces of hemoglobin, her UDS was positive for opiates cocaine and cannabis, her alcohol level was 61. I recommended to the ED to start on aggressive IV fluid hydration as her labs still appears hypovolemic, with hyponatremia acute kidney injury and a specific gravity of 1030, also advised the ED physician to call GI as she is definitely an acute hepatitis probably due to alcohol and Tylenol and she might need an assisted L-cysteine and steroids, we will accept her to a MedSurg bed.

## 2020-04-04 NOTE — Progress Notes (Signed)
   04/04/20 1648  Assess: MEWS Score  Temp 97.7 F (36.5 C)  BP 122/62  Pulse Rate (!) 122  Resp 20  SpO2 100 %  Assess: MEWS Score  MEWS Temp 0  MEWS Systolic 0  MEWS Pulse 2  MEWS RR 0  MEWS LOC 0  MEWS Score 2  MEWS Score Color Yellow  Assess: if the MEWS score is Yellow or Red  Were vital signs taken at a resting state? Yes  Focused Assessment No change from prior assessment  Early Detection of Sepsis Score *See Row Information* Low  MEWS guidelines implemented *See Row Information* Yes  Treat  MEWS Interventions Administered scheduled meds/treatments;Escalated (See documentation below)  Take Vital Signs  Increase Vital Sign Frequency  Yellow: Q 2hr X 2 then Q 4hr X 2, if remains yellow, continue Q 4hrs  Escalate  MEWS: Escalate Yellow: discuss with charge nurse/RN and consider discussing with provider and RRT  Notify: Charge Nurse/RN  Name of Charge Nurse/RN Notified Shearon Stalls, RN  Date Charge Nurse/RN Notified 04/04/20  Time Charge Nurse/RN Notified 1700
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
## 2020-04-04 NOTE — Progress Notes (Signed)
Patient arrived from [facility] with acetylcysteine running at 94mL/hr, with the bag hanging appearing almost full. Laurelyn Sickle, RN @ [facility] who hung the infusion and verified she started the bag at the 65mL/hr rate and did not give the loading bolus infusion. Pharmacy called and instructed to give the loading bolus infusion now and then resume the 74mL/hr rate after bolus is completed.

## 2020-04-04 NOTE — Progress Notes (Signed)
I spoke to Dr. Particia Nearing who is working in the [facility] Department today when the patient presented with acute liver injury concerning for acute hepatic failure.  I also spoke to Dr. Loura Halt with transplant hepatology at [facility] in [place].  We reviewed her history which is pertinent for ongoing and active alcohol abuse and discussed that her urinalysis was positive for cocaine today.  He states that she is not a transplant candidate and to continue supportive care.    She will be coming to [facility] and we will see her formally in consultation  After discussing the case with transplant hepatology I would hold on prednisolone for the first 24 to 48 hours until we sure there is no concomitant infection.  He did think that N-acetylcysteine may provide some benefit and no harm.  Thus I recommend that we start N-acetylcysteine but not steroids at the moment.  Avoid hepatotoxins.  Monitor INR and mental status closely.
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
## 2020-04-04 NOTE — ED Notes (Signed)
Checked CBG 123, RN Shaun informed

## 2020-04-05 DIAGNOSIS — B179 Acute viral hepatitis, unspecified: Secondary | ICD-10-CM | POA: Diagnosis not present

## 2020-04-05 DIAGNOSIS — D689 Coagulation defect, unspecified: Secondary | ICD-10-CM | POA: Diagnosis not present

## 2020-04-05 DIAGNOSIS — R6510 Systemic inflammatory response syndrome (SIRS) of non-infectious origin without acute organ dysfunction: Secondary | ICD-10-CM | POA: Diagnosis present

## 2020-04-05 DIAGNOSIS — F1010 Alcohol abuse, uncomplicated: Secondary | ICD-10-CM

## 2020-04-05 DIAGNOSIS — F1410 Cocaine abuse, uncomplicated: Secondary | ICD-10-CM

## 2020-04-05 DIAGNOSIS — K7201 Acute and subacute hepatic failure with coma: Secondary | ICD-10-CM | POA: Diagnosis not present

## 2020-04-05 DIAGNOSIS — F1110 Opioid abuse, uncomplicated: Secondary | ICD-10-CM

## 2020-04-05 DIAGNOSIS — F1210 Cannabis abuse, uncomplicated: Secondary | ICD-10-CM

## 2020-04-05 DIAGNOSIS — K7200 Acute and subacute hepatic failure without coma: Secondary | ICD-10-CM | POA: Diagnosis not present

## 2020-04-05 DIAGNOSIS — R7989 Other specified abnormal findings of blood chemistry: Secondary | ICD-10-CM

## 2020-04-05 DIAGNOSIS — F99 Mental disorder, not otherwise specified: Secondary | ICD-10-CM

## 2020-04-05 LAB — GLUCOSE, CAPILLARY
Glucose-Capillary: 103 mg/dL — ABNORMAL HIGH (ref 70–99)
Glucose-Capillary: 67 mg/dL — ABNORMAL LOW (ref 70–99)
Glucose-Capillary: 68 mg/dL — ABNORMAL LOW (ref 70–99)
Glucose-Capillary: 72 mg/dL (ref 70–99)
Glucose-Capillary: 78 mg/dL (ref 70–99)
Glucose-Capillary: 85 mg/dL (ref 70–99)
Glucose-Capillary: 99 mg/dL (ref 70–99)
Glucose-Capillary: 99 mg/dL (ref 70–99)

## 2020-04-05 LAB — CBC WITH DIFFERENTIAL/PLATELET
Abs Immature Granulocytes: 0.03 10*3/uL (ref 0.00–0.07)
Basophils Absolute: 0 10*3/uL (ref 0.0–0.1)
Basophils Relative: 0 %
Eosinophils Absolute: 0 10*3/uL (ref 0.0–0.5)
Eosinophils Relative: 0 %
HCT: 22.8 % — ABNORMAL LOW (ref 36.0–46.0)
Hemoglobin: 7.1 g/dL — ABNORMAL LOW (ref 12.0–15.0)
Immature Granulocytes: 0 %
Lymphocytes Relative: 5 %
Lymphs Abs: 0.3 10*3/uL — ABNORMAL LOW (ref 0.7–4.0)
MCH: 26.2 pg (ref 26.0–34.0)
MCHC: 31.1 g/dL (ref 30.0–36.0)
MCV: 84.1 fL (ref 80.0–100.0)
Monocytes Absolute: 0.4 10*3/uL (ref 0.1–1.0)
Monocytes Relative: 6 %
Neutro Abs: 6.2 10*3/uL (ref 1.7–7.7)
Neutrophils Relative %: 89 %
Platelets: 51 10*3/uL — ABNORMAL LOW (ref 150–400)
RBC: 2.71 MIL/uL — ABNORMAL LOW (ref 3.87–5.11)
RDW: 18 % — ABNORMAL HIGH (ref 11.5–15.5)
WBC: 6.9 10*3/uL (ref 4.0–10.5)
nRBC: 0.3 % — ABNORMAL HIGH (ref 0.0–0.2)

## 2020-04-05 LAB — COMPREHENSIVE METABOLIC PANEL
ALT: 5957 U/L — ABNORMAL HIGH (ref 0–44)
ALT: 4620 U/L — ABNORMAL HIGH (ref 0–44)
AST: >10000 U/L — ABNORMAL HIGH (ref 15–41)
AST: >10000 U/L — ABNORMAL HIGH (ref 15–41)
Albumin: 3.1 g/dL — ABNORMAL LOW (ref 3.5–5.0)
Albumin: 3.3 g/dL — ABNORMAL LOW (ref 3.5–5.0)
Alkaline Phosphatase: 144 U/L — ABNORMAL HIGH (ref 38–126)
Alkaline Phosphatase: 145 U/L — ABNORMAL HIGH (ref 38–126)
Anion gap: 18 — ABNORMAL HIGH (ref 5–15)
Anion gap: 15 (ref 5–15)
BUN: 12 mg/dL (ref 6–20)
BUN: 12 mg/dL (ref 6–20)
CO2: 16 mmol/L — ABNORMAL LOW (ref 22–32)
CO2: 22 mmol/L (ref 22–32)
Calcium: 7.8 mg/dL — ABNORMAL LOW (ref 8.9–10.3)
Calcium: 8.3 mg/dL — ABNORMAL LOW (ref 8.9–10.3)
Chloride: 101 mmol/L (ref 98–111)
Chloride: 104 mmol/L (ref 98–111)
Creatinine, Ser: 1.63 mg/dL — ABNORMAL HIGH (ref 0.44–1.00)
Creatinine, Ser: 1.68 mg/dL — ABNORMAL HIGH (ref 0.44–1.00)
GFR calc Af Amer: 49 mL/min — ABNORMAL LOW (ref >60)
GFR calc Af Amer: 47 mL/min — ABNORMAL LOW (ref >60)
GFR calc non Af Amer: 42 mL/min — ABNORMAL LOW (ref >60)
GFR calc non Af Amer: 41 mL/min — ABNORMAL LOW (ref >60)
Glucose, Bld: 105 mg/dL — ABNORMAL HIGH (ref 70–99)
Glucose, Bld: 90 mg/dL (ref 70–99)
Potassium: 4 mmol/L (ref 3.5–5.1)
Potassium: 3.8 mmol/L (ref 3.5–5.1)
Sodium: 135 mmol/L (ref 135–145)
Sodium: 141 mmol/L (ref 135–145)
Total Bilirubin: 6.1 mg/dL — ABNORMAL HIGH (ref 0.3–1.2)
Total Bilirubin: 6.4 mg/dL — ABNORMAL HIGH (ref 0.3–1.2)
Total Protein: 5.8 g/dL — ABNORMAL LOW (ref 6.5–8.1)
Total Protein: 5.8 g/dL — ABNORMAL LOW (ref 6.5–8.1)

## 2020-04-05 LAB — LACTIC ACID, PLASMA
Lactic Acid, Venous: 7.6 mmol/L (ref 0.5–1.9)
Lactic Acid, Venous: 6 mmol/L (ref 0.5–1.9)

## 2020-04-05 LAB — BLOOD GAS, ARTERIAL
Acid-base deficit: 3.7 mmol/L — ABNORMAL HIGH (ref 0.0–2.0)
Bicarbonate: 19.5 mmol/L — ABNORMAL LOW (ref 20.0–28.0)
Drawn by: 331761
FIO2: 21
O2 Saturation: 98.6 %
Patient temperature: 98.4
pCO2 arterial: 29.2 mmHg — ABNORMAL LOW (ref 32.0–48.0)
pH, Arterial: 7.439 (ref 7.350–7.450)
pO2, Arterial: 110 mmHg — ABNORMAL HIGH (ref 83.0–108.0)

## 2020-04-05 LAB — CBC
HCT: 26.8 % — ABNORMAL LOW (ref 36.0–46.0)
Hemoglobin: 8.2 g/dL — ABNORMAL LOW (ref 12.0–15.0)
MCH: 26.5 pg (ref 26.0–34.0)
MCHC: 30.6 g/dL (ref 30.0–36.0)
MCV: 86.5 fL (ref 80.0–100.0)
Platelets: 57 10*3/uL — ABNORMAL LOW (ref 150–400)
RBC: 3.1 MIL/uL — ABNORMAL LOW (ref 3.87–5.11)
RDW: 18.2 % — ABNORMAL HIGH (ref 11.5–15.5)
WBC: 9.1 10*3/uL (ref 4.0–10.5)
nRBC: 0 % (ref 0.0–0.2)

## 2020-04-05 LAB — FIBRINOGEN: Fibrinogen: 120 mg/dL — ABNORMAL LOW (ref 210–475)

## 2020-04-05 LAB — PROTIME-INR
INR: >10 (ref 0.8–1.2)
INR: 4.3 (ref 0.8–1.2)
Prothrombin Time: >90 seconds — ABNORMAL HIGH (ref 11.4–15.2)
Prothrombin Time: 39.8 seconds — ABNORMAL HIGH (ref 11.4–15.2)

## 2020-04-05 LAB — AMMONIA: Ammonia: 106 umol/L — ABNORMAL HIGH (ref 9–35)

## 2020-04-05 LAB — OCCULT BLOOD GASTRIC / DUODENUM (SPECIMEN CUP)
Occult Blood, Gastric: POSITIVE — AB
pH, Gastric: 3

## 2020-04-05 LAB — ABO/RH: ABO/RH(D): B POS

## 2020-04-05 MED ORDER — VITAMIN K1 10 MG/ML IJ SOLN
10.0000 mg | Freq: Once | INTRAVENOUS | Status: DC
  Filled 2020-04-05: qty 1

## 2020-04-05 MED ORDER — DEXTROSE 50 % IV SOLN
12.5000 g | INTRAVENOUS | Status: AC
  Administered 2020-04-05: 12.5 g via INTRAVENOUS
  Filled 2020-04-05: qty 50

## 2020-04-05 MED ORDER — PANTOPRAZOLE SODIUM 40 MG IV SOLR
40.0000 mg | Freq: Two times a day (BID) | INTRAVENOUS | Status: DC
  Administered 2020-04-08 – 2020-04-13 (×11): 40 mg via INTRAVENOUS
  Filled 2020-04-05 (×11): qty 40

## 2020-04-05 MED ORDER — DEXTROSE 50 % IV SOLN
INTRAVENOUS | Status: AC
  Filled 2020-04-05: qty 50

## 2020-04-05 MED ORDER — LACTULOSE 10 GM/15ML PO SOLN
20.0000 g | Freq: Two times a day (BID) | ORAL | Status: DC
  Administered 2020-04-05: 20 g via ORAL
  Filled 2020-04-05: qty 30

## 2020-04-05 MED ORDER — SODIUM CHLORIDE 0.9 % IV SOLN
8.0000 mg/h | INTRAVENOUS | Status: AC
  Administered 2020-04-05 – 2020-04-08 (×7): 8 mg/h via INTRAVENOUS
  Filled 2020-04-05 (×9): qty 80

## 2020-04-05 MED ORDER — SODIUM CHLORIDE 0.9% IV SOLUTION: Freq: Once | INTRAVENOUS | Status: DC

## 2020-04-05 MED ORDER — CHLORHEXIDINE GLUCONATE CLOTH 2 % EX PADS
6.0000 | MEDICATED_PAD | Freq: Every day | CUTANEOUS | Status: DC
  Administered 2020-04-05 – 2020-04-29 (×24): 6 via TOPICAL

## 2020-04-05 MED ORDER — VITAMIN K1 10 MG/ML IJ SOLN
10.0000 mg | Freq: Once | INTRAVENOUS | Status: AC
  Administered 2020-04-06: 10 mg via INTRAVENOUS
  Filled 2020-04-05: qty 1

## 2020-04-05 MED ORDER — VITAMIN K1 10 MG/ML IJ SOLN
10.0000 mg | Freq: Once | INTRAVENOUS | Status: AC
  Administered 2020-04-05: 10 mg via INTRAVENOUS
  Filled 2020-04-05: qty 1

## 2020-04-05 MED ORDER — LORAZEPAM 2 MG/ML IJ SOLN
1.0000 mg | INTRAMUSCULAR | Status: DC | PRN
  Administered 2020-04-05: 1 mg via INTRAVENOUS
  Filled 2020-04-05 (×2): qty 1

## 2020-04-05 MED ORDER — DEXTROSE 50 % IV SOLN
12.5000 g | INTRAVENOUS | Status: AC
  Administered 2020-04-05: 12.5 g via INTRAVENOUS

## 2020-04-05 MED ORDER — DEXTROSE 5 % IV SOLN
6.2500 mg/kg/h | INTRAVENOUS | Status: AC
  Administered 2020-04-05: 6.25 mg/kg/h via INTRAVENOUS
  Filled 2020-04-05 (×2): qty 120

## 2020-04-05 MED ORDER — HALOPERIDOL LACTATE 5 MG/ML IJ SOLN
5.0000 mg | Freq: Once | INTRAMUSCULAR | Status: AC
  Administered 2020-04-05: 5 mg via INTRAVENOUS
  Filled 2020-04-05: qty 1

## 2020-04-05 MED ORDER — LORAZEPAM 2 MG/ML IJ SOLN
0.5000 mg | INTRAMUSCULAR | Status: DC | PRN
  Administered 2020-04-05 (×2): 0.5 mg via INTRAVENOUS
  Filled 2020-04-05 (×3): qty 1

## 2020-04-05 MED ORDER — SODIUM CHLORIDE 0.9 % IV SOLN
80.0000 mg | Freq: Once | INTRAVENOUS | Status: AC
  Administered 2020-04-05: 80 mg via INTRAVENOUS
  Filled 2020-04-05 (×2): qty 80

## 2020-04-05 MED ORDER — VITAMIN K1 10 MG/ML IJ SOLN
10.0000 mg | Freq: Once | INTRAVENOUS | Status: DC
  Filled 2020-04-05 (×2): qty 1

## 2020-04-05 MED ORDER — VITAMIN K1 10 MG/ML IJ SOLN
10.0000 mg | Freq: Once | INTRAVENOUS | Status: AC
  Administered 2020-04-07: 10 mg via INTRAVENOUS
  Filled 2020-04-05: qty 1

## 2020-04-05 MED ORDER — FUROSEMIDE 10 MG/ML IJ SOLN: 20.0000 mg | Freq: Once | INTRAMUSCULAR | Status: DC

## 2020-04-05 MED ORDER — METHYLPREDNISOLONE SODIUM SUCC 40 MG IJ SOLR
40.0000 mg | INTRAMUSCULAR | Status: DC
  Administered 2020-04-05 – 2020-04-12 (×8): 40 mg via INTRAVENOUS
  Filled 2020-04-05 (×9): qty 1

## 2020-04-05 MED ORDER — DEXTROSE-NACL 5-0.45 % IV SOLN: INTRAVENOUS | Status: AC
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
## 2020-04-05 NOTE — TOC Initial Note (Signed)
Transition of Care [facility]) - Initial/Assessment Note    Patient Details  Name: Gina Hooper MRN: [id number] Date of Birth: 1991-05-23  [facility]) CM/SW Contact:    Golda Acre, RN Phone Number: 04/05/2020, 1:11 PM  Clinical Narrative:                 Patient with hepatitis hx of etoh abuse, on Chronulac, iv soul medrol, acetradote , protoniox getting one unit of ffp due to elevated inr, lactic acid -6.0, liver enzymes elevated.  Expected Discharge Plan: Home/Self Care Barriers to Discharge: Continued Medical Work up   Patient Goals and CMS Choice Patient states their goals for this hospitalization and ongoing recovery are:: to go home CMS Medicare.gov Compare Post Acute Care list provided to:: Patient    Expected Discharge Plan and Services Expected Discharge Plan: Home/Self Care   Discharge Planning Services: CM Consult   Living arrangements for the past 2 months: Single Family Home                                      Prior Living Arrangements/Services Living arrangements for the past 2 months: Single Family Home Lives with:: Self Patient language and need for interpreter reviewed:: Yes Do you feel safe going back to the place where you live?: Yes      Need for Family Participation in Patient Care: Yes (Comment) Care giver support system in place?: Yes (comment)   Criminal Activity/Legal Involvement Pertinent to Current Situation/Hospitalization: No - Comment as needed  Activities of Daily Living Home Assistive Devices/Equipment: None ADL Screening (condition at time of admission) Patient's cognitive ability adequate to safely complete daily activities?: Yes Is the patient deaf or have difficulty hearing?: No Does the patient have difficulty seeing, even when wearing glasses/contacts?: No Does the patient have difficulty concentrating, remembering, or making decisions?: No Patient able to express need for assistance with ADLs?:  Yes Does the patient have difficulty dressing or bathing?: No Independently performs ADLs?: Yes (appropriate for developmental age) Does the patient have difficulty walking or climbing stairs?: No Weakness of Legs: Both Weakness of Arms/Hands: Both  Permission Sought/Granted                  Emotional Assessment Appearance:: Appears older than stated age Attitude/Demeanor/Rapport: Engaged Affect (typically observed): Appropriate Orientation: : Oriented to Self, Oriented to Place, Oriented to  Time, Oriented to Situation Alcohol / Substance Use: Tobacco Use, Alcohol Use Psych Involvement: No (comment)  Admission diagnosis:  Alcohol abuse [F10.10] Acute hepatitis [B17.9] Hepatitis [K75.9] Hypoglycemia [E16.2] Jaundice [R17] Acute liver failure [K72.00] Polysubstance abuse (HCC) [F19.10] Acute liver failure without hepatic coma [K72.00] Patient Active Problem List   Diagnosis Date Noted   SIRS (systemic inflammatory response syndrome) (HCC) 04/05/2020   Acute hepatitis 04/04/2020   Acute liver failure 04/04/2020   Alcohol abuse 04/04/2020   Thrombocytopenia (HCC) 04/04/2020   Coagulopathy (HCC) 04/04/2020   Ulcer of ankle, left, limited to breakdown of skin (HCC)    Osteomyelitis (HCC) 01/14/2018   Alcohol dependence with uncomplicated withdrawal (HCC) 01/14/2018   PCP:  Patient, No Pcp Per Pharmacy:   [facility] [address] [place] [address] Phone: [phone] Fax: [phone]  [facility] - [place], [place] - [address] [place] [address] Phone: [phone] Fax: [phone]  [id number] - [place] - [address] AT [facility] [address] [place] [address] Phone: [phone] Fax: [phone]  [id number] - [place], [place] - [address] AT [facility] [address] [place] [address] Phone: [phone] Fax: [phone]  [id number] - [place], [place] - [address] [address] Phone: [phone] Fax: [phone]     Social Determinants of Health (SDOH) Interventions    Readmission Risk Interventions No flowsheet data found.

## 2020-04-05 NOTE — Progress Notes (Signed)
Full note to follow Chart reviewed discussed in detail with Dr. Meridee Score of GI Patient is arousable but incoherent at best and cannot really orient I do not know if this is secondary to Ativan that she might of received or if this is encephalopathy With her vomiting x2 blood, I think it is important to get central access and I will speak with critical care to look at her later on this morning to obtain this in addition to consult as if she does not arouse some more with her encephalopathy which may be grade 3 or 4, she may need to be intubated, have an NG tube placed and continue supportive NAC and Protonix I had a long discussion with her father Gina Hooper at [id number] who lives in [place] and I discussed the gravity of the situation and the possibility of compromise in addition to but not limited to death I explained to him clearly that she is not a transplant candidate given the input from [facility] [place] as per notes by GI here Greatly appreciate the expertise of GI in addition to help from critical care We will defer steroids at this juncture but probably will need to start this once central access is placed I have discussed with nursing staff to place an NG tube in the interim if she is unable to take p.o. safely and start the lactulose I have discontinued multiple medications which have a propensity for encephalopathy/sedation so that this does not muddy the picture  I spent 25 minutes of critical care time on this patient this morning Pleas Koch, MD [facility] 8:02 AM

## 2020-04-05 NOTE — Progress Notes (Addendum)
CRITICAL VALUE ALERT  Critical Value:  INR >10  Date & Time Notied:  04/05/2020 @ 0701  Provider Notified: Dr. Gwenette Greet  Orders Received/Actions taken: Vitamin K IVPB Stat
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
## 2020-04-05 NOTE — Progress Notes (Signed)
CRITICAL VALUE ALERT  Critical Value: Lactic Acid 7.6  Date & Time Notied:  04/05/2020 0620  Provider Notified: Marikay Alar   Orders Received/Actions taken: no new orders given
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
## 2020-04-06 ENCOUNTER — Inpatient Hospital Stay (HOSPITAL_COMMUNITY): Payer: Medicaid Other

## 2020-04-06 DIAGNOSIS — K7201 Acute and subacute hepatic failure with coma: Secondary | ICD-10-CM

## 2020-04-06 DIAGNOSIS — D689 Coagulation defect, unspecified: Secondary | ICD-10-CM | POA: Diagnosis not present

## 2020-04-06 LAB — PREPARE FRESH FROZEN PLASMA
Unit division: 0
Unit division: 0

## 2020-04-06 LAB — BPAM FFP
Blood Product Expiration Date: 202108172359
Blood Product Expiration Date: 202108172359
Blood Product Expiration Date: 202108172359
ISSUE DATE / TIME: 202108121300
ISSUE DATE / TIME: 202108121600
ISSUE DATE / TIME: 202108121828
Unit Type and Rh: 7300
Unit Type and Rh: 7300
Unit Type and Rh: 7300

## 2020-04-06 LAB — COMPREHENSIVE METABOLIC PANEL
ALT: 3972 U/L — ABNORMAL HIGH (ref 0–44)
ALT: 2460 U/L — ABNORMAL HIGH (ref 0–44)
AST: 7344 U/L — ABNORMAL HIGH (ref 15–41)
AST: 2522 U/L — ABNORMAL HIGH (ref 15–41)
Albumin: 2.9 g/dL — ABNORMAL LOW (ref 3.5–5.0)
Albumin: 2.9 g/dL — ABNORMAL LOW (ref 3.5–5.0)
Alkaline Phosphatase: 134 U/L — ABNORMAL HIGH (ref 38–126)
Alkaline Phosphatase: 139 U/L — ABNORMAL HIGH (ref 38–126)
Anion gap: 14 (ref 5–15)
Anion gap: 11 (ref 5–15)
BUN: 12 mg/dL (ref 6–20)
BUN: 14 mg/dL (ref 6–20)
CO2: 22 mmol/L (ref 22–32)
CO2: 25 mmol/L (ref 22–32)
Calcium: 8.2 mg/dL — ABNORMAL LOW (ref 8.9–10.3)
Calcium: 8.1 mg/dL — ABNORMAL LOW (ref 8.9–10.3)
Chloride: 106 mmol/L (ref 98–111)
Chloride: 108 mmol/L (ref 98–111)
Creatinine, Ser: 1.53 mg/dL — ABNORMAL HIGH (ref 0.44–1.00)
Creatinine, Ser: 1.35 mg/dL — ABNORMAL HIGH (ref 0.44–1.00)
GFR calc Af Amer: 53 mL/min — ABNORMAL LOW (ref >60)
GFR calc Af Amer: >60 mL/min (ref >60)
GFR calc non Af Amer: 45 mL/min — ABNORMAL LOW (ref >60)
GFR calc non Af Amer: 53 mL/min — ABNORMAL LOW (ref >60)
Glucose, Bld: 113 mg/dL — ABNORMAL HIGH (ref 70–99)
Glucose, Bld: 143 mg/dL — ABNORMAL HIGH (ref 70–99)
Potassium: 3.2 mmol/L — ABNORMAL LOW (ref 3.5–5.1)
Potassium: 3.2 mmol/L — ABNORMAL LOW (ref 3.5–5.1)
Sodium: 142 mmol/L (ref 135–145)
Sodium: 144 mmol/L (ref 135–145)
Total Bilirubin: 6.7 mg/dL — ABNORMAL HIGH (ref 0.3–1.2)
Total Bilirubin: 7.8 mg/dL — ABNORMAL HIGH (ref 0.3–1.2)
Total Protein: 5.5 g/dL — ABNORMAL LOW (ref 6.5–8.1)
Total Protein: 5.1 g/dL — ABNORMAL LOW (ref 6.5–8.1)

## 2020-04-06 LAB — CBC WITH DIFFERENTIAL/PLATELET
Abs Immature Granulocytes: 0.03 10*3/uL (ref 0.00–0.07)
Abs Immature Granulocytes: 0.02 10*3/uL (ref 0.00–0.07)
Basophils Absolute: 0 10*3/uL (ref 0.0–0.1)
Basophils Absolute: 0 10*3/uL (ref 0.0–0.1)
Basophils Relative: 0 %
Basophils Relative: 0 %
Eosinophils Absolute: 0 10*3/uL (ref 0.0–0.5)
Eosinophils Absolute: 0 10*3/uL (ref 0.0–0.5)
Eosinophils Relative: 0 %
Eosinophils Relative: 0 %
HCT: 23.8 % — ABNORMAL LOW (ref 36.0–46.0)
HCT: 22.8 % — ABNORMAL LOW (ref 36.0–46.0)
Hemoglobin: 7.4 g/dL — ABNORMAL LOW (ref 12.0–15.0)
Hemoglobin: 7.3 g/dL — ABNORMAL LOW (ref 12.0–15.0)
Immature Granulocytes: 1 %
Immature Granulocytes: 0 %
Lymphocytes Relative: 6 %
Lymphocytes Relative: 5 %
Lymphs Abs: 0.4 10*3/uL — ABNORMAL LOW (ref 0.7–4.0)
Lymphs Abs: 0.3 10*3/uL — ABNORMAL LOW (ref 0.7–4.0)
MCH: 26.3 pg (ref 26.0–34.0)
MCH: 26.2 pg (ref 26.0–34.0)
MCHC: 31.1 g/dL (ref 30.0–36.0)
MCHC: 32 g/dL (ref 30.0–36.0)
MCV: 84.7 fL (ref 80.0–100.0)
MCV: 81.7 fL (ref 80.0–100.0)
Monocytes Absolute: 0.5 10*3/uL (ref 0.1–1.0)
Monocytes Absolute: 0.6 10*3/uL (ref 0.1–1.0)
Monocytes Relative: 7 %
Monocytes Relative: 9 %
Neutro Abs: 5.5 10*3/uL (ref 1.7–7.7)
Neutro Abs: 5.3 10*3/uL (ref 1.7–7.7)
Neutrophils Relative %: 86 %
Neutrophils Relative %: 86 %
Platelets: 51 10*3/uL — ABNORMAL LOW (ref 150–400)
Platelets: 71 10*3/uL — ABNORMAL LOW (ref 150–400)
RBC: 2.81 MIL/uL — ABNORMAL LOW (ref 3.87–5.11)
RBC: 2.79 MIL/uL — ABNORMAL LOW (ref 3.87–5.11)
RDW: 17.9 % — ABNORMAL HIGH (ref 11.5–15.5)
RDW: 17.8 % — ABNORMAL HIGH (ref 11.5–15.5)
WBC: 6.4 10*3/uL (ref 4.0–10.5)
WBC: 6.2 10*3/uL (ref 4.0–10.5)
nRBC: 0 % (ref 0.0–0.2)
nRBC: 0 % (ref 0.0–0.2)

## 2020-04-06 LAB — BLOOD GAS, ARTERIAL
Acid-Base Excess: 2 mmol/L (ref 0.0–2.0)
Bicarbonate: 24.2 mmol/L (ref 20.0–28.0)
Drawn by: 560031
FIO2: 100
MECHVT: 470 mL
O2 Saturation: 100 %
PEEP: 5 cmH2O
Patient temperature: 98.7
RATE: 16 resp/min
pCO2 arterial: 29.2 mmHg — ABNORMAL LOW (ref 32.0–48.0)
pH, Arterial: 7.529 — ABNORMAL HIGH (ref 7.350–7.450)
pO2, Arterial: 357 mmHg — ABNORMAL HIGH (ref 83.0–108.0)

## 2020-04-06 LAB — LACTIC ACID, PLASMA
Lactic Acid, Venous: 3.6 mmol/L (ref 0.5–1.9)
Lactic Acid, Venous: 3.8 mmol/L (ref 0.5–1.9)

## 2020-04-06 LAB — GLUCOSE, CAPILLARY
Glucose-Capillary: 100 mg/dL — ABNORMAL HIGH (ref 70–99)
Glucose-Capillary: 108 mg/dL — ABNORMAL HIGH (ref 70–99)
Glucose-Capillary: 111 mg/dL — ABNORMAL HIGH (ref 70–99)
Glucose-Capillary: 112 mg/dL — ABNORMAL HIGH (ref 70–99)
Glucose-Capillary: 118 mg/dL — ABNORMAL HIGH (ref 70–99)
Glucose-Capillary: 121 mg/dL — ABNORMAL HIGH (ref 70–99)
Glucose-Capillary: 90 mg/dL (ref 70–99)

## 2020-04-06 LAB — PROTIME-INR
INR: 5.4 (ref 0.8–1.2)
INR: 5.5 (ref 0.8–1.2)
Prothrombin Time: 48 seconds — ABNORMAL HIGH (ref 11.4–15.2)
Prothrombin Time: 48.5 seconds — ABNORMAL HIGH (ref 11.4–15.2)

## 2020-04-06 LAB — FIBRINOGEN: Fibrinogen: 106 mg/dL — ABNORMAL LOW (ref 210–475)

## 2020-04-06 LAB — AMMONIA: Ammonia: 141 umol/L — ABNORMAL HIGH (ref 9–35)

## 2020-04-06 LAB — MRSA PCR SCREENING: MRSA by PCR: NEGATIVE

## 2020-04-06 LAB — ANA W/REFLEX IF POSITIVE: Anti Nuclear Antibody (ANA): NEGATIVE

## 2020-04-06 MED ORDER — ORAL CARE MOUTH RINSE
15.0000 mL | OROMUCOSAL | Status: DC
  Administered 2020-04-06 – 2020-04-16 (×93): 15 mL via OROMUCOSAL

## 2020-04-06 MED ORDER — LACTULOSE ENEMA
300.0000 mL | Freq: Once | ORAL | Status: DC
  Filled 2020-04-06 (×2): qty 300

## 2020-04-06 MED ORDER — METOPROLOL TARTRATE 5 MG/5ML IV SOLN
5.0000 mg | Freq: Four times a day (QID) | INTRAVENOUS | Status: DC | PRN
  Administered 2020-04-06 – 2020-04-11 (×6): 5 mg via INTRAVENOUS
  Filled 2020-04-06 (×6): qty 5

## 2020-04-06 MED ORDER — POTASSIUM CHLORIDE 10 MEQ/100ML IV SOLN
10.0000 meq | INTRAVENOUS | Status: AC
  Administered 2020-04-06 (×4): 10 meq via INTRAVENOUS
  Filled 2020-04-06 (×4): qty 100

## 2020-04-06 MED ORDER — ROCURONIUM BROMIDE 10 MG/ML (PF) SYRINGE
PREFILLED_SYRINGE | INTRAVENOUS | Status: AC
  Administered 2020-04-06: 50 mg
  Filled 2020-04-06: qty 10

## 2020-04-06 MED ORDER — DEXTROSE-NACL 5-0.45 % IV SOLN: INTRAVENOUS | Status: DC

## 2020-04-06 MED ORDER — LORAZEPAM 2 MG/ML IJ SOLN
1.0000 mg | INTRAMUSCULAR | Status: DC | PRN
  Administered 2020-04-06 – 2020-04-13 (×11): 1 mg via INTRAVENOUS
  Filled 2020-04-06 (×11): qty 1

## 2020-04-06 MED ORDER — MIDAZOLAM HCL 2 MG/2ML IJ SOLN
INTRAMUSCULAR | Status: AC
  Administered 2020-04-06: 2 mg
  Filled 2020-04-06: qty 4

## 2020-04-06 MED ORDER — FENTANYL CITRATE (PF) 100 MCG/2ML IJ SOLN
INTRAMUSCULAR | Status: AC
  Administered 2020-04-06: 100 ug
  Filled 2020-04-06: qty 2

## 2020-04-06 MED ORDER — POTASSIUM CHLORIDE CRYS ER 20 MEQ PO TBCR: 40.0000 meq | EXTENDED_RELEASE_TABLET | Freq: Once | ORAL | Status: DC

## 2020-04-06 MED ORDER — ETOMIDATE 2 MG/ML IV SOLN
INTRAVENOUS | Status: AC
  Administered 2020-04-06: 20 mg
  Filled 2020-04-06: qty 20

## 2020-04-06 MED ORDER — LACTULOSE 10 GM/15ML PO SOLN
20.0000 g | Freq: Three times a day (TID) | ORAL | Status: DC
  Administered 2020-04-06 – 2020-04-07 (×5): 20 g
  Filled 2020-04-06 (×5): qty 30

## 2020-04-06 MED ORDER — "THROMBI-PAD 3""X3"" EX PADS"
1.0000 | MEDICATED_PAD | Freq: Once | CUTANEOUS | Status: AC
  Administered 2020-04-06: 1 via TOPICAL
  Filled 2020-04-06: qty 1

## 2020-04-06 MED ORDER — SODIUM CHLORIDE 0.9% IV SOLUTION: Freq: Once | INTRAVENOUS | Status: DC

## 2020-04-06 MED ORDER — GADOBUTROL 1 MMOL/ML IV SOLN
7.0000 mL | Freq: Once | INTRAVENOUS | Status: AC | PRN
  Administered 2020-04-06: 7 mL via INTRAVENOUS

## 2020-04-06 MED ORDER — CHLORHEXIDINE GLUCONATE 0.12% ORAL RINSE (MEDLINE KIT)
15.0000 mL | Freq: Two times a day (BID) | OROMUCOSAL | Status: DC
  Administered 2020-04-06 – 2020-04-15 (×20): 15 mL via OROMUCOSAL

## 2020-04-06 NOTE — Progress Notes (Addendum)
Progress Note        ASSESSMENT AND PLAN:   CC:    Liver failure   Gina Hooper is a 29 y.o. female PMH significant for, but not necessarily limited to,   Etoh and polysubstance abuse, depression                                                                                                                                 # Acute liver failure in setting of polysubstance abuse, alcohol, possibly component of shock liver  --Acute hepatitis panel negative.  --Liver enzymes and INR improved overnight but she had a further deterioration in mental status this am. Head CT scan ordered. Now intubated on ventilator.  --AST down from > 10 K to 7344, ALT down from 4600 to 3872. --INR down from > 10 to 5.4 post Vit K and FFP. Bleeding at central line site at the moment. Got another dose of IV Vit K a couple of hours ago, getting another dose tomorrow.  --NAC in progress --Ammonia 141. Continue lactulose TID via OGT --Fibrinogen low at 106. Would Cryoprecipitate be helpful?  --[facility] declined transfer, not a transplant candidate. We reached out to [facility] last evening. It sounds like she might be considered if can get funding. Case Worker has evaluated, trying to get emergency Medicaid.    # Hematemesis.  --in setting of severe coagulopathy.  --Low suspicion for esophageal varices.  --PPI infusion   --Resolved with Vit K / FFP though still at risk with INR > 5. No blood in OG tube --Eventually may need EGD, sooner rather than later if bleeding recurs    # [place] anemia --Stable overnight at 7.5. Her baseline hgb ~ 10.  --Hgb stable, Monitor hgb, transfuse if needed  OBJECTIVE:     Vital signs in last 24 hours: Temp:  [98.1 F (36.7 C)-98.8 F (37.1 C)] 98.8 F (37.1 C) (08/13 1300) Pulse Rate:  [102-127] 127 (08/13 0840) Resp:  [16-22] 16 (08/13 0840) BP: (130-162)/(79-107) 148/96 (08/13 0400) SpO2:  [98 %-100 %] 100 % (08/13 1145) FiO2 (%):  [40 %-100 %] 40 % (08/13  1145)   General:   Well developed female sedated. Fresh blood oozing from central line Heart:  Sinus tachycardia. No lower extremity edema   Pulm: ETT, Ventilated. CTA bilaterally   Abdomen:  Soft, nondistended.  A few bowel sounds.          Neurologic: sedated.   Intake/Output from previous day: 08/12 0701 - 08/13 0700 In: 1078.6 [P.O.:240; I.V.:126.1; Blood:712.5] Out: 1825 [Urine:1825] Intake/Output this shift: Total I/O In: 468.4 [I.V.:368.4; IV Piggyback:100] Out: -   Lab Results: Recent Labs    04/05/20 0535 04/05/20 1702 04/06/20 0245  WBC 9.1 6.9 6.4  HGB 8.2* 7.1* 7.4*  HCT 26.8* 22.8* 23.8*  PLT 57* 51* 51*   BMET Recent Labs    04/05/20 0535 04/05/20 1702 04/06/20 0245  NA 135 141 142  K 4.0 3.8 3.2*  CL 101 104 106  CO2 16* 22 22  GLUCOSE 105* 90 113*  BUN 12 12 12   CREATININE 1.63* 1.68* 1.53*  CALCIUM 7.8* 8.3* 8.2*   LFT Recent Labs    04/06/20 0245  PROT 5.5*  ALBUMIN 2.9*  AST 7,344*  ALT 3,972*  ALKPHOS 134*  BILITOT 6.7*   PT/INR Recent Labs    04/05/20 1702 04/06/20 0245  LABPROT 39.8* 48.0*  INR 4.3* 5.4*   Hepatitis Panel Recent Labs    04/04/20 0930  HEPBSAG NON REACTIVE  HCVAB NON REACTIVE  HEPAIGM NON REACTIVE  HEPBIGM NON REACTIVE    DG Abd 1 View  Result Date: 04/06/2020 CLINICAL DATA:  Hepatitis.  Liver failure.  Alcohol abuse. EXAM: ABDOMEN - 1 VIEW COMPARISON:  None. FINDINGS: The bowel gas pattern is normal. No radio-opaque calculi or other significant radiographic abnormality are seen. IMPRESSION: Normal examination. Electronically Signed   By: 04/08/2020 M.D.   On: 04/06/2020 08:34   DG CHEST PORT 1 VIEW  Result Date: 04/06/2020 CLINICAL DATA:  Intubation EXAM: PORTABLE CHEST 1 VIEW COMPARISON:  April 04, 2020. FINDINGS: Evaluation is limited secondary to patient rotation. ETT tip terminates just above the carina by approximately 1-2 mm. The enteric tube courses through the chest to the abdomen beyond  the field-of-view. RIGHT neck CVC tip terminates over the superior cavoatrial junction. The cardiomediastinal silhouette is normal in contour. No pleural effusion. No pneumothorax. No acute pleuroparenchymal abnormality. Visualized abdomen is unremarkable. No acute osseous abnormality noted. IMPRESSION: 1. ETT tip terminates just above the carina by approximately 1-2 mm. Consider repositioning. 2.  Support apparatus as described above. Electronically Signed   By: April 06, 2020 MD   On: 04/06/2020 10:24   DG Abd Portable 1V  Result Date: 04/06/2020 CLINICAL DATA:  Enteric tube EXAM: PORTABLE ABDOMEN - 1 VIEW COMPARISON:  None. FINDINGS: Enteric tube tip and side port project over the stomach. There is kinking of the tube at the superior stomach near the cardia. Correlate with function. Air-filled nondilated loops of bowel. Visualized lung bases are unremarkable. No acute osseous abnormality. IMPRESSION: 1. Enteric tube tip and side port project over the stomach. 2. There is kinking of the enteric tube at the superior stomach near the cardia. Correlate with function. Electronically Signed   By: 04/08/2020 MD   On: 04/06/2020 10:26    Active Problems:   Acute hepatitis   Acute liver failure   Alcohol abuse   Thrombocytopenia (HCC)   Coagulopathy (HCC)   SIRS (systemic inflammatory response syndrome) (HCC)     LOS: 2 days   04/08/2020 ,NP 04/06/2020, 2:38 PM

## 2020-04-06 NOTE — Progress Notes (Signed)
CRITICAL VALUE ALERT  Critical Value:  INR 5.4  Date & Time Notied:  06 Apr 2020  Provider Notified: Bruna Potter NP  Orders Received/Actions taken: AWAITING

## 2020-04-06 NOTE — Progress Notes (Signed)
Events noted Patient has become progressively more encephalopathic is hard to arouse she has dilated pupils and I fear that her mentation and liver failure may have caused some increased intracranial pressure  she  will require probable intubation and NG tube placement for lactulose given her ammonia being elevated I am hopeful that her downward trend of her labs portends well for her prognosis however I had a very clear discussion with her father Gina Hooper this morning on the phone (his numbers in the chart) that her prognosis is abysmal given her prior cocaine use ethanol use and shock liver I have discussed with Dr. Isaiah Serge of critical care who will take over as attending and I will sign off I have placed a consult for TOC to get emergent Medicaid as without this I do not know if she can be transferred to a tertiary center for further liver care   No charge Pleas Koch, MD [facility] 9:22 AM

## 2020-04-06 NOTE — Progress Notes (Signed)
CRITICAL VALUE ALERT  Critical Value:  INR 5.5  Date & Time Notied:  04/06/2020 1917h  Provider Notified: Yvone Neu, RN

## 2020-04-06 NOTE — Progress Notes (Signed)
Pt taken to MRI at 8:45 pm and returned to unit at 10 pm. No significant changes occurred during this out of unit trip. Pt is still on no sedation and does not respond.

## 2020-04-06 NOTE — TOC Progression Note (Addendum)
Transition of Care [facility]) - Progression Note    Patient Details  Name: Gina Hooper MRN: [id number] Date of Birth: Mar 21, 1991  Transition of Care [facility]) CM/SW Contact  Golda Acre, RN Phone Number: 04/06/2020, 9:00 AM  Clinical Narrative:    rec'ivg fppp this am due to inr of 5.4, prothrobim time is 48,liver enzymes remains highly elevated. Following for progression and toc needs. Financial planner has sent case over to med assist to start medicaid process. Expected Discharge Plan: Home/Self Care Barriers to Discharge: Continued Medical Work up  Expected Discharge Plan and Services Expected Discharge Plan: Home/Self Care   Discharge Planning Services: CM Consult   Living arrangements for the past 2 months: Single Family Home                                       Social Determinants of Health (SDOH) Interventions    Readmission Risk Interventions No flowsheet data found.
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
## 2020-04-06 NOTE — Progress Notes (Signed)
Pt ETT pulled back from 23 to 21 at the lip per MD order. RT will continue to monitor.

## 2020-04-06 NOTE — Progress Notes (Signed)
Notified provider of abnormal lab results this AM. Awaiting orders. Will continue to assess for change in status and condition.

## 2020-04-06 NOTE — Progress Notes (Signed)
When assessing level of consciousness, patient noted to be posturing to stimulus. Pupils equal and reactive but noted to be dilated during posturing episodes. New orders received for MRI. MRI available to be performed at 830pm. MD made aware. Will continue to monitor.

## 2020-04-06 NOTE — Progress Notes (Signed)
NAME:  Gina Hooper, MRN:  [id number], DOB:  May 28, 1991, LOS: 2 ADMISSION DATE:  04/04/2020, CONSULTATION DATE:  8/12 REFERRING MD:  Mahala Menghini , CHIEF COMPLAINT:  Acute encephalopathy    Brief History   29 year old female with history of severe alcoholic dependence, and polysubstance abuse admitted with chief complaint of abdominal pain nausea and vomiting on 8/11 found to be in acute liver failure in the setting of alcoholic hepatitis with profound coagulopathy as well as hematemesis.  Critical care asked to evaluate on 8/12 given declining mental status and concern about IV access  History of present illness   29 year old female with significant history of alcohol and polysubstance abuse she was admitted to the hospital on 8/11 with chief complaint of abdominal pain, nausea and vomiting. She had been taking Tylenol for severe pain. However as the pain became unbearable she called EMS. On arrival she was found to be hypoglycemic. Tachycardic, and nauseated. She was transferred to the emergency room and on admission found to have AST greater than 10,000, ALT greater than 4000. Platelet count of 40, Tylenol level less than 10. Urinary drug screen was positive for cocaine and cannabis . Alcohol level 61. He also had high anion gap metabolic acidosis with elevated lactate. Gastroenterology team was consulted, her pregnancy test was negative, hepatitis a and C were nonreactive. They felt her acute hepatitis was primarily secondary to acute alcoholic hepatitis plus/minus complicated by Tylenol ingestion. They had called transplant team at [facility], given her heavy substance and alcohol abuse history she was denied as a transplant candidate, and recommendations were to continue/start NAC infusion, initiate steroids when infection ruled out, and finally correct coagulopathy. Acetylcysteine bag was initiated while in the emergency room. Over the course of the evening from 8/11 to the morning of 8/12 she became more  encephalopathic , Has had 2 episodes of fairly large volume of emesis with frank blood.  She has become progressively incoherent receiving as needed dosing of Ativan.  Critical care was consulted because she was progressively encephalopathic with severe coagulopathy and ongoing liver failure.  There was concern about worsening airway compromise and IV access  Past Medical History  ETOH abuse, alcohol related hepatitis, anemia, prior osteo of left ankle. Substance abuse, coagulopathy   Significant Hospital Events   8/11 admitted.Discriminant fx score 304.  <MELD-Na 40, [name], t bili 4.8 on 04/04/20.  hepatomegly, non-specific GB wall thickening likely reactive on CT. she was seen by GI.  [facility]/transplant:.  Given her history of polysubstance abuse she is not a candidate for transplant.  Recommendations were to hydrate, correct coagulopathy, start acetylcysteine infusion for possible component of Tylenol toxicity on top of alcoholic hepatitis.  Recommended starting systemic steroids once systemic infection ruled out  Consults:  Gastroenterology consulted 8/11 Critical care consulted 9/11 -  Procedures:  Neuro  Significant Diagnostic Tests:   8/11: CT abdomen: Borderline hepatomegaly with diffuse patchy low-attenuation with concern for acute inflammation/hepatitis.  There is gallbladder wall thickening with pericholecystic fluid felt reactive there were no gallstones.  There is a small right effusion.  Mild ascites in the pelvis. Micro Data:    Antimicrobials:    Interim history/subjective:   More encephalopathic today, unresponsive in the morning. Emergently intubated for airway protection.  Objective   Blood pressure (!) 148/96, pulse (!) 127, temperature 98.4 F (36.9 C), temperature source Axillary, resp. rate 16, height 5\' 6"  (1.676 m), weight 70.3 kg, last menstrual period 03/25/2020, SpO2 100 %.    Vent  Mode: PRVC FiO2 (%):  [100 %] 100 % Set Rate:  [16 bmp]  16 bmp Vt Set:  [470 mL] 470 mL PEEP:  [5 cmH20] 5 cmH20 Plateau Pressure:  [15 cmH20] 15 cmH20   Intake/Output Summary (Last 24 hours) at 04/06/2020 1121 Last data filed at 04/06/2020 1020 Gross per 24 hour  Intake 1307.01 ml  Output 1125 ml  Net 182.01 ml   Filed Weights   04/04/20 0613 04/04/20 1648  Weight: 66.7 kg 70.3 kg    Examination: Blood pressure (!) 148/96, pulse (!) 127, temperature 98.4 F (36.9 C), temperature source Axillary, resp. rate 16, height 5\' 6"  (1.676 m), weight 70.3 kg, last menstrual period 03/25/2020, SpO2 100 %. Gen:      No acute distress HEENT:  EOMI, sclera anicteric Neck:     No masses; no thyromegaly, ETT Lungs:    Clear to auscultation bilaterally; normal respiratory effort CV:         Regular rate and rhythm; no murmurs Abd:      + bowel sounds; soft, non-tender; no palpable masses, no distension Ext:    1+ edema; adequate peripheral perfusion Skin:      Warm and dry; no rash Neuro: Sedated, unresponsive  Resolved Hospital Problem list     Assessment & Plan:   Acute liver failure in the setting of acute alcoholic hepatitis plus/minus Tylenol toxicity -Seen by gastroenterology -Confirmed that she is not a transplant candidate -Discriminant fx score 304.  <MELD-Na 40, [name], t bili 4.8  -AST remains over 10,000, ALT still climbing, all LFTs elevated Plan Continue NAC, Solu-Medrol Supportive care  UGIB w/ hematemesis Not felt to be variceal bleed.  Likely secondary to gastritis Plan PPI infusion Follow CBC  Severe coagulopathy in the setting of acute liver failure. Plan S/p FFP Vitamin K Trend INR  Lactic acidosis is setting of organ hypoperfusion and poor hepatic clearance  Plan Supportive care  Thrombocytopenia in setting liver disease Plan Trend cbc Transfuse as indicated if PLTs <25 or < 50 if actively bleeding  AKI Plan Ensure volume resuscitation Renal dose meds Serial chemistries   Acute hepatic  encephalopathy, further complicated acute metabolic encephalopathy in setting of alcohol withdrawal. Acute worsening today.  Probably recent should not get lactulose overnight Plan CT head Resume lactulose via NG tube Holding sedating medication Continue thiamine, folate  Best practice:  Diet: NPO Pain/Anxiety/Delirium protocol (if indicated): modified CIWA VAP protocol (if indicated): na DVT prophylaxis: scd GI prophylaxis: ppi gtt Glucose control: NA Mobility: BR Code Status: full code  Family Communication: Father updated by [facility] service. Disposition: to SDU   Critical care time: 35 min   The patient is critically ill with multiple organ system failure and requires high complexity decision making for assessment and support, frequent evaluation and titration of therapies, advanced monitoring, review of radiographic studies and interpretation of complex data.   Critical Care Time devoted to patient care services, exclusive of separately billable procedures, described in this note is 45 minutes.   05/25/2020 MD [facility] Please see [email or web address] for pager details.  04/06/2020, 11:22 AM

## 2020-04-06 NOTE — Progress Notes (Signed)
PCCM progress note  Mental status very poor with no responses Pupils reactive but she appears to be posturing to stimulus CT with no acute abnormalities However given clinical presentation will get an MRI brain.  Chilton Greathouse MD [facility] Please see [email or web address] for pager details.  04/06/2020, 6:02 PM

## 2020-04-06 NOTE — Progress Notes (Signed)
eLink Physician-Brief Progress Note [name] DOB: May 19, 1991 MRN: [id number]   Date of Service  04/06/2020  HPI/Events of Note  INR 5.5, no overt bleeding currently but has bled recently, she received 10 mg Vitamin K today, coagulopathy largely due to liver failure. Hemoglobin 7.3 gm.  eICU Interventions  Given recent bleeding and marginal hemoglobin of 7.3 gm I will transfuse 2 units of  FFP for INR of 5.5.        Migdalia Dk 04/06/2020, 8:44 PM

## 2020-04-06 NOTE — Progress Notes (Signed)
eLink Physician-Brief Progress Note [name] DOB: 10/01/1990 MRN: [id number]   Date of Service  04/06/2020  HPI/Events of Note  K+ 3.2  eICU Interventions  Elink electrolyte replacement protocol ordered.        [name] 04/06/2020, 6:45 AM

## 2020-04-07 ENCOUNTER — Inpatient Hospital Stay (HOSPITAL_COMMUNITY): Payer: Medicaid Other

## 2020-04-07 DIAGNOSIS — J9601 Acute respiratory failure with hypoxia: Secondary | ICD-10-CM | POA: Diagnosis not present

## 2020-04-07 DIAGNOSIS — K7201 Acute and subacute hepatic failure with coma: Secondary | ICD-10-CM | POA: Diagnosis not present

## 2020-04-07 DIAGNOSIS — D689 Coagulation defect, unspecified: Secondary | ICD-10-CM | POA: Diagnosis not present

## 2020-04-07 DIAGNOSIS — D696 Thrombocytopenia, unspecified: Secondary | ICD-10-CM

## 2020-04-07 HISTORY — DX: Acute respiratory failure with hypoxia: J96.01

## 2020-04-07 LAB — BLOOD GAS, ARTERIAL
Acid-Base Excess: 1.3 mmol/L (ref 0.0–2.0)
Allens test (pass/fail): POSITIVE — AB
Bicarbonate: 26.2 mmol/L (ref 20.0–28.0)
FIO2: 40
MECHVT: 470 mL
O2 Saturation: 90 %
PEEP: 5 cmH2O
Patient temperature: 35.4
RATE: 16 resp/min
pCO2 arterial: 42.3 mmHg (ref 32.0–48.0)
pH, Arterial: 7.399 (ref 7.350–7.450)
pO2, Arterial: 57.1 mmHg — ABNORMAL LOW (ref 83.0–108.0)

## 2020-04-07 LAB — COMPREHENSIVE METABOLIC PANEL
ALT: 2210 U/L — ABNORMAL HIGH (ref 0–44)
ALT: 2118 U/L — ABNORMAL HIGH (ref 0–44)
AST: 1595 U/L — ABNORMAL HIGH (ref 15–41)
AST: 1174 U/L — ABNORMAL HIGH (ref 15–41)
Albumin: 2.7 g/dL — ABNORMAL LOW (ref 3.5–5.0)
Albumin: 3 g/dL — ABNORMAL LOW (ref 3.5–5.0)
Alkaline Phosphatase: 129 U/L — ABNORMAL HIGH (ref 38–126)
Alkaline Phosphatase: 141 U/L — ABNORMAL HIGH (ref 38–126)
Anion gap: 13 (ref 5–15)
Anion gap: 10 (ref 5–15)
BUN: 16 mg/dL (ref 6–20)
BUN: 19 mg/dL (ref 6–20)
CO2: 26 mmol/L (ref 22–32)
CO2: 28 mmol/L (ref 22–32)
Calcium: 8.4 mg/dL — ABNORMAL LOW (ref 8.9–10.3)
Calcium: 8.1 mg/dL — ABNORMAL LOW (ref 8.9–10.3)
Chloride: 107 mmol/L (ref 98–111)
Chloride: 107 mmol/L (ref 98–111)
Creatinine, Ser: 1.24 mg/dL — ABNORMAL HIGH (ref 0.44–1.00)
Creatinine, Ser: 1.09 mg/dL — ABNORMAL HIGH (ref 0.44–1.00)
GFR calc Af Amer: >60 mL/min (ref >60)
GFR calc Af Amer: >60 mL/min (ref >60)
GFR calc non Af Amer: 59 mL/min — ABNORMAL LOW (ref >60)
GFR calc non Af Amer: >60 mL/min (ref >60)
Glucose, Bld: 100 mg/dL — ABNORMAL HIGH (ref 70–99)
Glucose, Bld: 149 mg/dL — ABNORMAL HIGH (ref 70–99)
Potassium: 3 mmol/L — ABNORMAL LOW (ref 3.5–5.1)
Potassium: 3.3 mmol/L — ABNORMAL LOW (ref 3.5–5.1)
Sodium: 146 mmol/L — ABNORMAL HIGH (ref 135–145)
Sodium: 145 mmol/L (ref 135–145)
Total Bilirubin: 8 mg/dL — ABNORMAL HIGH (ref 0.3–1.2)
Total Bilirubin: 9.1 mg/dL — ABNORMAL HIGH (ref 0.3–1.2)
Total Protein: 5.2 g/dL — ABNORMAL LOW (ref 6.5–8.1)
Total Protein: 5.5 g/dL — ABNORMAL LOW (ref 6.5–8.1)

## 2020-04-07 LAB — GLUCOSE, CAPILLARY
Glucose-Capillary: 106 mg/dL — ABNORMAL HIGH (ref 70–99)
Glucose-Capillary: 109 mg/dL — ABNORMAL HIGH (ref 70–99)
Glucose-Capillary: 127 mg/dL — ABNORMAL HIGH (ref 70–99)
Glucose-Capillary: 135 mg/dL — ABNORMAL HIGH (ref 70–99)
Glucose-Capillary: 139 mg/dL — ABNORMAL HIGH (ref 70–99)
Glucose-Capillary: 99 mg/dL (ref 70–99)
Glucose-Capillary: 99 mg/dL (ref 70–99)

## 2020-04-07 LAB — CMV IGM: CMV IgM: <30 AU/mL (ref 0.0–29.9)

## 2020-04-07 LAB — CBC WITH DIFFERENTIAL/PLATELET
Abs Immature Granulocytes: 0.02 10*3/uL (ref 0.00–0.07)
Abs Immature Granulocytes: 0.03 10*3/uL (ref 0.00–0.07)
Basophils Absolute: 0 10*3/uL (ref 0.0–0.1)
Basophils Absolute: 0 10*3/uL (ref 0.0–0.1)
Basophils Relative: 0 %
Basophils Relative: 0 %
Eosinophils Absolute: 0 10*3/uL (ref 0.0–0.5)
Eosinophils Absolute: 0 10*3/uL (ref 0.0–0.5)
Eosinophils Relative: 0 %
Eosinophils Relative: 0 %
HCT: 19.6 % — ABNORMAL LOW (ref 36.0–46.0)
HCT: 24.4 % — ABNORMAL LOW (ref 36.0–46.0)
Hemoglobin: 6.4 g/dL — CL (ref 12.0–15.0)
Hemoglobin: 8.3 g/dL — ABNORMAL LOW (ref 12.0–15.0)
Immature Granulocytes: 0 %
Immature Granulocytes: 1 %
Lymphocytes Relative: 7 %
Lymphocytes Relative: 6 %
Lymphs Abs: 0.4 10*3/uL — ABNORMAL LOW (ref 0.7–4.0)
Lymphs Abs: 0.3 10*3/uL — ABNORMAL LOW (ref 0.7–4.0)
MCH: 26.2 pg (ref 26.0–34.0)
MCH: 27.4 pg (ref 26.0–34.0)
MCHC: 32.7 g/dL (ref 30.0–36.0)
MCHC: 34 g/dL (ref 30.0–36.0)
MCV: 80.3 fL (ref 80.0–100.0)
MCV: 80.5 fL (ref 80.0–100.0)
Monocytes Absolute: 1 10*3/uL (ref 0.1–1.0)
Monocytes Absolute: 1 10*3/uL (ref 0.1–1.0)
Monocytes Relative: 19 %
Monocytes Relative: 18 %
Neutro Abs: 3.9 10*3/uL (ref 1.7–7.7)
Neutro Abs: 4.1 10*3/uL (ref 1.7–7.7)
Neutrophils Relative %: 74 %
Neutrophils Relative %: 75 %
Platelets: 82 10*3/uL — ABNORMAL LOW (ref 150–400)
Platelets: 94 10*3/uL — ABNORMAL LOW (ref 150–400)
RBC: 2.44 MIL/uL — ABNORMAL LOW (ref 3.87–5.11)
RBC: 3.03 MIL/uL — ABNORMAL LOW (ref 3.87–5.11)
RDW: 18.2 % — ABNORMAL HIGH (ref 11.5–15.5)
RDW: 17.1 % — ABNORMAL HIGH (ref 11.5–15.5)
WBC: 5.3 10*3/uL (ref 4.0–10.5)
WBC: 5.4 10*3/uL (ref 4.0–10.5)
nRBC: 0 % (ref 0.0–0.2)
nRBC: 0.4 % — ABNORMAL HIGH (ref 0.0–0.2)

## 2020-04-07 LAB — PREPARE RBC (CROSSMATCH)

## 2020-04-07 LAB — IMMUNOGLOBULINS A/E/G/M, SERUM
IgA: 224 mg/dL (ref 87–352)
IgE (Immunoglobulin E), Serum: 154 IU/mL (ref 6–495)
IgG (Immunoglobin G), Serum: 986 mg/dL (ref 586–1602)
IgM (Immunoglobulin M), Srm: 78 mg/dL (ref 26–217)

## 2020-04-07 LAB — PROTIME-INR
INR: 3.9 — ABNORMAL HIGH (ref 0.8–1.2)
INR: 3.7 — ABNORMAL HIGH (ref 0.8–1.2)
Prothrombin Time: 36.9 seconds — ABNORMAL HIGH (ref 11.4–15.2)
Prothrombin Time: 35.7 seconds — ABNORMAL HIGH (ref 11.4–15.2)

## 2020-04-07 LAB — MAGNESIUM: Magnesium: 2.2 mg/dL (ref 1.7–2.4)

## 2020-04-07 LAB — CERULOPLASMIN: Ceruloplasmin: 33.7 mg/dL (ref 19.0–39.0)

## 2020-04-07 LAB — PHOSPHORUS: Phosphorus: <1 mg/dL — CL (ref 2.5–4.6)

## 2020-04-07 MED ORDER — SODIUM CHLORIDE 0.9% IV SOLUTION: Freq: Once | INTRAVENOUS | Status: AC

## 2020-04-07 MED ORDER — POTASSIUM PHOSPHATES 15 MMOLE/5ML IV SOLN
30.0000 mmol | Freq: Once | INTRAVENOUS | Status: AC
  Administered 2020-04-07: 30 mmol via INTRAVENOUS
  Filled 2020-04-07: qty 10

## 2020-04-07 MED ORDER — LACTULOSE 10 GM/15ML PO SOLN
30.0000 g | Freq: Every day | ORAL | Status: DC
  Administered 2020-04-07 – 2020-04-11 (×17): 30 g
  Filled 2020-04-07 (×18): qty 45

## 2020-04-07 NOTE — Progress Notes (Signed)
Progress Note        ASSESSMENT AND PLAN:   CC:   Liver failure / hematemesis      Gina Hooper a 29 y.o.femalePMH significant for, but not necessarily limited to, Etoh and polysubstance abuse, depression  #Acute liver failure in setting of polysubstance abuse, alcohol, possibly component of shock liver --[facility] declined transfer, not a transplant candidate. We reached out to [facility]  It sounds like she might be considered if can get funding. Case Worker has evaluated, trying to get emergency Medicaid.  --Acute hepatitis panel negative.  --Deterioration in mental status yesterday>>> required intubation / ventilator.  --Head CT scan negative.  --Liver enzymes continue to improve. AST and ALT now < 2600. INR down to 3.9 with FFP and ongoing Vit K..  --NAC in progress --Continue lactulose TID via OGT --Fibrinogen low at 106. Would Cryoprecipitate be helpful?   # Hematemesis. --in setting of severe coagulopathy.  --Low suspicion for esophageal varices.  --PPI infusion   --Resolved with Vit K / FFP --Eventually may need EGD, sooner rather than later if bleeding recurs   # Grants anemia --Hgb had been stable around 7.3 but had acute drop to 6.4 overnight in absence of overt GI bleeding. She did seem to have a fair amount of bleeding from central line site yesterday.  --On exam there is a very scant amount of coffee ground looking particles in OGT which is currently clamped.  --I spoke with RN. No evidence for overt GIB ( hematemesis / melena). I asked her to irrigate / aspirate OG which is currently clamped and there was NO evidence for GI bleeding.  --Getting a unit of blood --Hopefully as INR continues to correct she will be less likely to have any bleeding --Continue PPI infusion through today then IV BID.    OBJECTIVE:     Vital  signs in last 24 hours: Temp:  [97.9 F (36.6 C)-99 F (37.2 C)] 99 F (37.2 C) (08/14 0835) Pulse Rate:  [91-128] 98 (08/14 0700) Resp:  [16-22] 16 (08/14 0700) BP: (104-144)/(62-112) 134/80 (08/14 0700) SpO2:  [100 %] 100 % (08/14 0823) FiO2 (%):  [40 %] 40 % (08/14 0823) Last BM Date:  (PTA) General: sedated and intubated Heart:  Regular rate and rhythm.  No lower extremity edema   Pulm: Normal respiratory effort   Abdomen:  Soft,  nontender, nondistended.  Normal bowel sounds.          Neurologic:  Alert and  oriented,  grossly normal neurologically. Psych:  Pleasant, cooperative.  Normal mood and affect.   Intake/Output from previous day: 08/13 0701 - 08/14 0700 In: 1958.2 [I.V.:1308.2; Blood:200; IV Piggyback:450] Out: 950 [Urine:950] Intake/Output this shift: No intake/output data recorded.  Lab Results: Recent Labs    04/06/20 0245 04/06/20 1745 04/07/20 0425  WBC 6.4 6.2 5.3  HGB 7.4* 7.3* 6.4*  HCT 23.8* 22.8* 19.6*  PLT 51* 71* 82*   BMET Recent Labs    04/05/20 1702 04/06/20 0245 04/06/20 1745  NA 141 142 144  K 3.8 3.2* 3.2*  CL 104 106 108  CO2 22 22 25   GLUCOSE 90 113* 143*  BUN 12 12 14   CREATININE 1.68* 1.53* 1.35*  CALCIUM 8.3* 8.2* 8.1*   LFT Recent Labs    04/06/20 1745  PROT 5.1*  ALBUMIN 2.9*  AST 2,522*  ALT 2,460*  ALKPHOS 139*  BILITOT 7.8*   PT/INR Recent Labs    04/06/20 1745 04/07/20 0425  LABPROT 48.5* 36.9*  INR 5.5* 3.9*   Hepatitis Panel Recent Labs    04/04/20 0930  HEPBSAG NON REACTIVE  HCVAB NON REACTIVE  HEPAIGM NON REACTIVE  HEPBIGM NON REACTIVE    DG Abd 1 View  Result Date: 04/06/2020 CLINICAL DATA:  Hepatitis.  Liver failure.  Alcohol abuse. EXAM: ABDOMEN - 1 VIEW COMPARISON:  None. FINDINGS: The bowel gas pattern is normal. No radio-opaque calculi or other significant radiographic abnormality are seen. IMPRESSION: Normal examination. Electronically Signed   By: Beckie Salts M.D.   On:  04/06/2020 08:34   CT HEAD WO CONTRAST  Result Date: 04/06/2020 CLINICAL DATA:  Mental status change EXAM: CT HEAD WITHOUT CONTRAST TECHNIQUE: Contiguous axial images were obtained from the base of the skull through the vertex without intravenous contrast. COMPARISON:  February 08, 2014, November 28, 2017 FINDINGS: Brain: No evidence of acute infarction, hemorrhage, hydrocephalus, extra-axial collection or mass lesion/mass effect. Vascular: No hyperdense vessel or unexpected calcification. Skull: Normal. Negative for fracture or focal lesion. Sinuses/Orbits: No acute finding. Other: None. IMPRESSION: No acute intracranial abnormality. Electronically Signed   By: Meda Klinefelter MD   On: 04/06/2020 15:52   MR BRAIN W WO CONTRAST  Result Date: 04/06/2020 CLINICAL DATA:  Mental status change EXAM: MRI HEAD WITHOUT AND WITH CONTRAST TECHNIQUE: Multiplanar, multiecho pulse sequences of the brain and surrounding structures were obtained without and with intravenous contrast. CONTRAST:  75mL GADAVIST GADOBUTROL 1 MMOL/ML IV SOLN COMPARISON:  04/06/2020 head CT. FINDINGS: Brain: No focal parenchymal signal abnormality. No acute infarct or intracranial hemorrhage. No midline shift, ventriculomegaly or extra-axial fluid collection. No mass lesion. No abnormal enhancement. Vascular: Normal flow voids. Skull and upper cervical spine: Normal marrow signal. Tiny incidental left temporomandibular joint cyst. Sinuses/Orbits: Normal orbits. Minimal to mild pansinus mucosal thickening. No mastoid effusion. Other: None. IMPRESSION: No acute intracranial process. Mild pansinus disease. Electronically Signed   By: Stana Bunting M.D.   On: 04/06/2020 22:13   DG Chest Port 1 View  Result Date: 04/07/2020 CLINICAL DATA:  Acute respiratory failure EXAM: PORTABLE CHEST 1 VIEW COMPARISON:  04/06/2020 FINDINGS: Cardiac shadow is within normal limits. Endotracheal tube, gastric catheter and right jugular central line are again seen  and stable. The lungs are clear bilaterally. No focal infiltrate is noted. IMPRESSION: Tubes and lines as described above.  No focal infiltrate is seen. Electronically Signed   By: Alcide Clever M.D.   On: 04/07/2020 06:02   DG CHEST PORT 1 VIEW  Result Date: 04/06/2020 CLINICAL DATA:  Intubation EXAM: PORTABLE CHEST 1 VIEW COMPARISON:  April 04, 2020. FINDINGS: Evaluation is limited secondary to patient rotation. ETT tip terminates just above the carina by approximately 1-2 mm. The enteric tube courses through the chest to the abdomen beyond the field-of-view. RIGHT neck CVC tip terminates over the superior cavoatrial junction. The cardiomediastinal silhouette is normal in contour. No pleural effusion. No pneumothorax. No acute pleuroparenchymal abnormality. Visualized abdomen is unremarkable. No acute osseous abnormality noted. IMPRESSION: 1. ETT tip terminates just above the carina by approximately 1-2 mm. Consider repositioning. 2.  Support apparatus as described above. Electronically Signed   By: Meda Klinefelter MD   On: 04/06/2020 10:24   DG Abd Portable 1V  Result Date: 04/06/2020 CLINICAL DATA:  Enteric tube EXAM: PORTABLE ABDOMEN - 1 VIEW COMPARISON:  None. FINDINGS: Enteric tube tip and side port project over the stomach. There is kinking of the tube at the superior stomach near the cardia. Correlate with function.  Air-filled nondilated loops of bowel. Visualized lung bases are unremarkable. No acute osseous abnormality. IMPRESSION: 1. Enteric tube tip and side port project over the stomach. 2. There is kinking of the enteric tube at the superior stomach near the cardia. Correlate with function. Electronically Signed   By: Meda Klinefelter MD   On: 04/06/2020 10:26      Active Problems:   Acute hepatitis   Acute liver failure   Alcohol abuse   Thrombocytopenia (HCC)   Coagulopathy (HCC)   SIRS (systemic inflammatory response syndrome) (HCC)     LOS: 3 days   Willette Cluster ,NP  04/07/2020, 9:13 AM

## 2020-04-07 NOTE — Progress Notes (Signed)
Pt biting down on ETT. RT made aware. Ativan was given to help relax patient.

## 2020-04-07 NOTE — Progress Notes (Signed)
NAME:  Gina Hooper, MRN:  [id number], DOB:  [birth year], LOS: 3 ADMISSION DATE:  04/04/2020, CONSULTATION DATE:  8/12 REFERRING MD:  Verlon Au , CHIEF COMPLAINT:  Acute encephalopathy    Brief History   29 year old female with history of severe alcoholic dependence, and polysubstance abuse admitted with chief complaint of abdominal pain nausea and vomiting on 8/11 found to be in acute liver failure in the setting of alcoholic hepatitis with profound coagulopathy as well as hematemesis.  Critical care asked to evaluate on 8/12 given declining mental status and concern about IV access  History of present illness   29 year old female with significant history of alcohol and polysubstance abuse she was admitted to the hospital on 8/11 with chief complaint of abdominal pain, nausea and vomiting. She had been taking Tylenol for severe pain. However as the pain became unbearable she called EMS. On arrival she was found to be hypoglycemic. Tachycardic, and nauseated. She was transferred to the emergency room and on admission found to have AST greater than 10,000, ALT greater than 4000. Platelet count of 40, Tylenol level less than 10. Urinary drug screen was positive for cocaine and cannabis . Alcohol level 61. He also had high anion gap metabolic acidosis with elevated lactate. Gastroenterology team was consulted, her pregnancy test was negative, hepatitis a and C were nonreactive. They felt her acute hepatitis was primarily secondary to acute alcoholic hepatitis plus/minus complicated by Tylenol ingestion. They had called transplant team at [facility], given her heavy substance and alcohol abuse history she was denied as a transplant candidate, and recommendations were to continue/start NAC infusion, initiate steroids when infection ruled out, and finally correct coagulopathy. Acetylcysteine bag was initiated while in the emergency room. Over the course of the evening from 8/11 to the morning of 8/12 she became  more encephalopathic , Has had 2 episodes of fairly large volume of emesis with frank blood.  She has become progressively incoherent receiving as needed dosing of Ativan.  Critical care was consulted because she was progressively encephalopathic with severe coagulopathy and ongoing liver failure.  There was concern about worsening airway compromise and IV access  Past Medical History  ETOH abuse, alcohol related hepatitis, anemia, prior osteo of left ankle. Substance abuse, coagulopathy   Significant Hospital Events   8/11 admitted.Discriminant fx score 304.  <MELD-Na 40, [name], t bili 4.8 on 04/04/20.  hepatomegly, non-specific GB wall thickening likely reactive on CT. she was seen by GI.  [facility]/transplant:.  Given her history of polysubstance abuse she is not a candidate for transplant.  Recommendations were to hydrate, correct coagulopathy, start acetylcysteine infusion for possible component of Tylenol toxicity on top of alcoholic hepatitis.  Recommended starting systemic steroids once systemic infection ruled out  Consults:  Gastroenterology consulted 8/11 Critical care consulted 9/11    Procedures:  R IJ  8/13> >> ET  8/13  >>>  Significant Diagnostic Tests:  8/11 drug screen   POS opiates/ cocaine / THC  8/11: CT abdomen: Borderline hepatomegaly with diffuse patchy low-attenuation with concern for acute inflammation/hepatitis.  There is gallbladder wall thickening with pericholecystic fluid felt reactive there were no gallstones.  There is a small right effusion.  Mild ascites in the pelvis. 8/14   MRI brain No acute intracranial process. Mild pansinus disease   Micro Data:  Covid 8/11  neg MRSA PCR 8/11  neg   Antimicrobials:    Scheduled Meds: . sodium chloride   Intravenous Once  . sodium chloride  Intravenous Once  . chlorhexidine gluconate (MEDLINE KIT)  15 mL Mouth Rinse BID  . Chlorhexidine Gluconate Cloth  6 each Topical Daily  . folic acid  1  mg Intravenous Daily  . lactulose  20 g Per Tube TID  . mouth rinse  15 mL Mouth Rinse 10 times per day  . methylPREDNISolone (SOLU-MEDROL) injection  40 mg Intravenous Q24H  . multivitamin with minerals  1 tablet Oral Daily  . [START ON 04/08/2020] pantoprazole  40 mg Intravenous Q12H  . sodium chloride flush  3 mL Intravenous Q12H  . thiamine  100 mg Oral Daily   Or  . thiamine  100 mg Intravenous Daily   Continuous Infusions: . sodium chloride Stopped (04/06/20 1017)  . acetylcysteine (ACETADOTE) infusion 24 g/600 mL 6.25 mg/kg/hr (04/07/20 0900)  . pantoprozole (PROTONIX) infusion 8 mg/hr (04/07/20 0900)  . potassium PHOSPHATE IVPB (in mmol)     PRN Meds:.sodium chloride, LORazepam, metoprolol tartrate, [DISCONTINUED] ondansetron **OR** ondansetron (ZOFRAN) IV, polyethylene glycol, sodium chloride flush  Interim history/subjective:  No change mental status     Objective   Blood pressure 138/80, pulse (!) 108, temperature 99 F (37.2 C), temperature source Axillary, resp. rate 17, height '5\' 6"'$  (1.676 m), weight 70.3 kg, last menstrual period 03/25/2020, SpO2 100 %.    Vent Mode: PRVC FiO2 (%):  [40 %] 40 % Set Rate:  [16 bmp] 16 bmp Vt Set:  [470 mL] 470 mL PEEP:  [5 cmH20] 5 cmH20 Plateau Pressure:  [12 cmH20-16 cmH20] 16 cmH20   Intake/Output Summary (Last 24 hours) at 04/07/2020 1040 Last data filed at 04/07/2020 0900 Gross per 24 hour  Intake 2177.88 ml  Output 950 ml  Net 1227.88 ml   Filed Weights   04/04/20 0613 04/04/20 1648  Weight: 66.7 kg 70.3 kg    Examination: tmax 99 Pt not responsive  No jvd Oropharynx et  Neck supple Lungs with rhonchi RRR no s3 or or sign murmur Abd mod distended/ limited excursion  Extr warm with no edema or clubbing noted    I personally reviewed images and agree with radiology impression as follows:  CXR:   Portable  8/14 Tubes and lines as described above.  No focal infiltrate is seen.    Resolved Hospital Problem  list     Assessment & Plan:   Acute liver failure in the setting of acute alcoholic hepatitis plus/minus Tylenol toxicity -Seen by gastroenterology -Confirmed that she is not a transplant candidate -Discriminant fx score 304.  <MELD-Na 40, [name], t bili 4.8  -AST remains over 10,000, ALT still climbing, all LFTs elevated Plan Finish  NAC, continue  Solu-Medrol Supportive care  UGIB w/ hematemesis/ blood loss anemia  Not felt to be variceal bleed.  Likely secondary to gastritis   Lab Results  Component Value Date   HGB 6.4 (LL) 04/07/2020   HGB 7.3 (L) 04/06/2020   HGB 7.4 (L) 04/06/2020    Plan PPI infusion  transfuse 8/14 for hgb < 7    Severe coagulopathy in the setting of acute liver failure. Lab Results  Component Value Date   INR 3.9 (H) 04/07/2020   INR 5.5 (HH) 04/06/2020   INR 5.4 (HH) 04/06/2020   trending down p ffp Plan S/p FFP Vitamin K Trend INR   Lactic acidosis is setting of organ hypoperfusion and poor hepatic clearance  Plan Supportive care    Thrombocytopenia in setting liver disease Lab Results  Component Value Date   PLT 82 (  L) 04/07/2020   PLT 71 (L) 04/06/2020   PLT 51 (L) 04/06/2020    Plan Trend cbc Transfuse as indicated if PLTs <25 or < 50 if actively bleeding  AKI Lab Results  Component Value Date   CREATININE 1.24 (H) 04/07/2020   CREATININE 1.35 (H) 04/06/2020   CREATININE 1.53 (H) 04/06/2020    Plan Ensure volume resuscitation Renal dose meds Serial chemistries   Acute hepatic encephalopathy, further complicated acute metabolic encephalopathy in setting of alcohol withdrawal. Acute worsening today.  Probably recent should not get lactulose overnight Plan  lactulose via NG tube Holding sedating medication Continue thiamine, folate MRI 8/13 neg   Hypokalemia/ hypophosphatemia  >> Kphos 48mol am 8/14   Best practice:  Diet: NPO Pain/Anxiety/Delirium protocol (if indicated): holding sedation    VAP protocol (if indicated):   DVT prophylaxis: scd GI prophylaxis: ppi gtt Glucose control: NA Mobility: BR Code Status: full code  Family Communication: Father updated by [facility] service 8/13, none at bedside 8/14  Disposition: ICU  The patient is critically ill with multiple organ systems failure and requires high complexity decision making for assessment and support, frequent evaluation and titration of therapies, application of advanced monitoring technologies and extensive interpretation of multiple databases. Critical Care Time devoted to patient care services described in this note is 35 minutes.     MChristinia Gully MD Pulmonary and CSt. Louis [phone]  After 7:00 pm call Elink  [phone]

## 2020-04-07 NOTE — Progress Notes (Signed)
CRITICAL VALUE ALERT  Critical Value: Hgb 6.4, Phos <1.0  Date & Time Notied: 8/14  Provider Notified: Dr Sherene Sires  Orders Received/Actions taken: 1UPRBC ordered

## 2020-04-08 DIAGNOSIS — D689 Coagulation defect, unspecified: Secondary | ICD-10-CM | POA: Diagnosis not present

## 2020-04-08 DIAGNOSIS — D696 Thrombocytopenia, unspecified: Secondary | ICD-10-CM | POA: Diagnosis not present

## 2020-04-08 DIAGNOSIS — J9601 Acute respiratory failure with hypoxia: Secondary | ICD-10-CM | POA: Diagnosis not present

## 2020-04-08 DIAGNOSIS — F1910 Other psychoactive substance abuse, uncomplicated: Secondary | ICD-10-CM | POA: Diagnosis not present

## 2020-04-08 DIAGNOSIS — K7201 Acute and subacute hepatic failure with coma: Secondary | ICD-10-CM | POA: Diagnosis not present

## 2020-04-08 LAB — CBC WITH DIFFERENTIAL/PLATELET
Abs Immature Granulocytes: 0.07 10*3/uL (ref 0.00–0.07)
Abs Immature Granulocytes: 0.14 10*3/uL — ABNORMAL HIGH (ref 0.00–0.07)
Basophils Absolute: 0 10*3/uL (ref 0.0–0.1)
Basophils Absolute: 0 10*3/uL (ref 0.0–0.1)
Basophils Relative: 0 %
Basophils Relative: 0 %
Eosinophils Absolute: 0 10*3/uL (ref 0.0–0.5)
Eosinophils Absolute: 0 10*3/uL (ref 0.0–0.5)
Eosinophils Relative: 0 %
Eosinophils Relative: 0 %
HCT: 24.3 % — ABNORMAL LOW (ref 36.0–46.0)
HCT: 26.3 % — ABNORMAL LOW (ref 36.0–46.0)
Hemoglobin: 8 g/dL — ABNORMAL LOW (ref 12.0–15.0)
Hemoglobin: 8.7 g/dL — ABNORMAL LOW (ref 12.0–15.0)
Immature Granulocytes: 1 %
Immature Granulocytes: 2 %
Lymphocytes Relative: 6 %
Lymphocytes Relative: 6 %
Lymphs Abs: 0.5 10*3/uL — ABNORMAL LOW (ref 0.7–4.0)
Lymphs Abs: 0.6 10*3/uL — ABNORMAL LOW (ref 0.7–4.0)
MCH: 26.5 pg (ref 26.0–34.0)
MCH: 26.8 pg (ref 26.0–34.0)
MCHC: 32.9 g/dL (ref 30.0–36.0)
MCHC: 33.1 g/dL (ref 30.0–36.0)
MCV: 80.5 fL (ref 80.0–100.0)
MCV: 80.9 fL (ref 80.0–100.0)
Monocytes Absolute: 2 10*3/uL — ABNORMAL HIGH (ref 0.1–1.0)
Monocytes Absolute: 1.8 10*3/uL — ABNORMAL HIGH (ref 0.1–1.0)
Monocytes Relative: 23 %
Monocytes Relative: 19 %
Neutro Abs: 5.8 10*3/uL (ref 1.7–7.7)
Neutro Abs: 7 10*3/uL (ref 1.7–7.7)
Neutrophils Relative %: 70 %
Neutrophils Relative %: 73 %
Platelets: 117 10*3/uL — ABNORMAL LOW (ref 150–400)
Platelets: 125 10*3/uL — ABNORMAL LOW (ref 150–400)
RBC: 3.02 MIL/uL — ABNORMAL LOW (ref 3.87–5.11)
RBC: 3.25 MIL/uL — ABNORMAL LOW (ref 3.87–5.11)
RDW: 17.5 % — ABNORMAL HIGH (ref 11.5–15.5)
RDW: 17.8 % — ABNORMAL HIGH (ref 11.5–15.5)
WBC: 8.4 10*3/uL (ref 4.0–10.5)
WBC: 9.6 10*3/uL (ref 4.0–10.5)
nRBC: 0.8 % — ABNORMAL HIGH (ref 0.0–0.2)
nRBC: 1.5 % — ABNORMAL HIGH (ref 0.0–0.2)

## 2020-04-08 LAB — BPAM FFP
Blood Product Expiration Date: 202108192359
Blood Product Expiration Date: 202108192359
ISSUE DATE / TIME: 202108140223
ISSUE DATE / TIME: 202108140411
Unit Type and Rh: 7300
Unit Type and Rh: 7300

## 2020-04-08 LAB — COMPREHENSIVE METABOLIC PANEL
ALT: 1852 U/L — ABNORMAL HIGH (ref 0–44)
ALT: 1519 U/L — ABNORMAL HIGH (ref 0–44)
AST: 657 U/L — ABNORMAL HIGH (ref 15–41)
AST: 431 U/L — ABNORMAL HIGH (ref 15–41)
Albumin: 3.1 g/dL — ABNORMAL LOW (ref 3.5–5.0)
Albumin: 3.1 g/dL — ABNORMAL LOW (ref 3.5–5.0)
Alkaline Phosphatase: 144 U/L — ABNORMAL HIGH (ref 38–126)
Alkaline Phosphatase: 153 U/L — ABNORMAL HIGH (ref 38–126)
Anion gap: 10 (ref 5–15)
Anion gap: 12 (ref 5–15)
BUN: 21 mg/dL — ABNORMAL HIGH (ref 6–20)
BUN: 24 mg/dL — ABNORMAL HIGH (ref 6–20)
CO2: 28 mmol/L (ref 22–32)
CO2: 27 mmol/L (ref 22–32)
Calcium: 8.2 mg/dL — ABNORMAL LOW (ref 8.9–10.3)
Calcium: 8.7 mg/dL — ABNORMAL LOW (ref 8.9–10.3)
Chloride: 108 mmol/L (ref 98–111)
Chloride: 110 mmol/L (ref 98–111)
Creatinine, Ser: 0.98 mg/dL (ref 0.44–1.00)
Creatinine, Ser: 0.83 mg/dL (ref 0.44–1.00)
GFR calc Af Amer: >60 mL/min (ref >60)
GFR calc Af Amer: >60 mL/min (ref >60)
GFR calc non Af Amer: >60 mL/min (ref >60)
GFR calc non Af Amer: >60 mL/min (ref >60)
Glucose, Bld: 128 mg/dL — ABNORMAL HIGH (ref 70–99)
Glucose, Bld: 138 mg/dL — ABNORMAL HIGH (ref 70–99)
Potassium: 2.8 mmol/L — ABNORMAL LOW (ref 3.5–5.1)
Potassium: 3.6 mmol/L (ref 3.5–5.1)
Sodium: 146 mmol/L — ABNORMAL HIGH (ref 135–145)
Sodium: 149 mmol/L — ABNORMAL HIGH (ref 135–145)
Total Bilirubin: 10.3 mg/dL — ABNORMAL HIGH (ref 0.3–1.2)
Total Bilirubin: 11.7 mg/dL — ABNORMAL HIGH (ref 0.3–1.2)
Total Protein: 5.6 g/dL — ABNORMAL LOW (ref 6.5–8.1)
Total Protein: 5.5 g/dL — ABNORMAL LOW (ref 6.5–8.1)

## 2020-04-08 LAB — TYPE AND SCREEN
ABO/RH(D): B POS
Antibody Screen: NEGATIVE
Unit division: 0

## 2020-04-08 LAB — PREPARE FRESH FROZEN PLASMA: Unit division: 0

## 2020-04-08 LAB — PHOSPHORUS: Phosphorus: 1.9 mg/dL — ABNORMAL LOW (ref 2.5–4.6)

## 2020-04-08 LAB — GLUCOSE, CAPILLARY
Glucose-Capillary: 115 mg/dL — ABNORMAL HIGH (ref 70–99)
Glucose-Capillary: 116 mg/dL — ABNORMAL HIGH (ref 70–99)
Glucose-Capillary: 116 mg/dL — ABNORMAL HIGH (ref 70–99)
Glucose-Capillary: 118 mg/dL — ABNORMAL HIGH (ref 70–99)
Glucose-Capillary: 124 mg/dL — ABNORMAL HIGH (ref 70–99)
Glucose-Capillary: 125 mg/dL — ABNORMAL HIGH (ref 70–99)

## 2020-04-08 LAB — PROTIME-INR
INR: 3.6 — ABNORMAL HIGH (ref 0.8–1.2)
INR: 3.3 — ABNORMAL HIGH (ref 0.8–1.2)
Prothrombin Time: 35 seconds — ABNORMAL HIGH (ref 11.4–15.2)
Prothrombin Time: 32.4 seconds — ABNORMAL HIGH (ref 11.4–15.2)

## 2020-04-08 LAB — BPAM RBC
Blood Product Expiration Date: 202109092359
ISSUE DATE / TIME: 202108141128
Unit Type and Rh: 7300

## 2020-04-08 MED ORDER — POTASSIUM CHLORIDE 10 MEQ/50ML IV SOLN
10.0000 meq | INTRAVENOUS | Status: AC
  Administered 2020-04-08 (×5): 10 meq via INTRAVENOUS
  Filled 2020-04-08 (×5): qty 50

## 2020-04-08 MED ORDER — POTASSIUM CHLORIDE 10 MEQ/50ML IV SOLN
INTRAVENOUS | Status: AC
  Administered 2020-04-08: 10 meq via INTRAVENOUS
  Filled 2020-04-08: qty 50

## 2020-04-08 NOTE — Progress Notes (Signed)
Pt has not voided for 4 hrs. Bladder scanned 350 cc.

## 2020-04-08 NOTE — Progress Notes (Signed)
Progress Note        ASSESSMENT AND PLAN:   CC:   Liver failure / hematemesis      Gina Hooper a 29 y.o.femalePMH significant for, but not necessarily limited to, Etoh and polysubstance abuse, depression  #Acute liver failure in setting of polysubstance abuse, alcohol, possibly component of shock liver --[facility] declined transfer, not a transplant candidate. We reached out to [facility]  It sounds likeshe might be considered if can get funding.Case Worker has evaluated, trying to get emergency Medicaid. --Progressive encephalopathy requiring intubation on 8/13.  --Head CT scan 8/13 - no acute findings --NAC finished 8/14. On Solumedrol --Brain MRI 8/14 - no acute findings. --Not on sedation, not even getting Ativan.  --I saw her earlier this am and she was not responsive, not even to sternal rub. However I reentered room a few minutes later and she was blinking her eyes and starting to move around  --Due to lack of BMs we increased Lactulose to 30 grams 5 x daily yesterday. She had 3 BMs on night shift. If need be can give lactulose enemas as well but she seems to be waking up now.   --Liver enzymes continue to improve. AST 650s and ALT 1800s. INR still elevated at 3.6 with with FFP / Vit K. No active bleeding.      # Hematemesis. --in setting of severe coagulopathy.  --Low suspicion for esophageal varices.  --Hematemesis resolved with improvement in INR. OGT irrigated and aspirated yesterday and no signs of upper bleeding.   --Will change PPI infusion to BID PI infusion  --EGD if bleeds again.   # [place] anemia --Hgb had been stable around 7.3 but had acute drop to 6.4 yesterday. She had a fair amount of bleeding from central line site   --Hgb up from 6.4 to to 8 today post unit of blood yesterday.     OBJECTIVE:     Vital  signs in last 24 hours: Temp:  [97.2 F (36.2 C)-98.6 F (37 C)] 98.1 F (36.7 C) (08/15 0800) Pulse Rate:  [83-104] 103 (08/15 1140) Resp:  [16-18] 16 (08/15 1140) BP: (105-139)/(64-85) 122/74 (08/15 0900) SpO2:  [100 %] 100 % (08/15 1140) FiO2 (%):  [30 %] 30 % (08/15 1140) Last BM Date: 04/08/20 General:   NAD, Intubated.  Heart:  Sinus tachycardia. No lower extremity edema   Pulm: intubated on vent   Abdomen:  Soft, nondistended.  Normal bowel sounds.          Neurologic:  Blinking eyes, biting ETT. Starting to move around within the last few minutes. Marland Kitchen Psych:  Pleasant, cooperative.  Normal mood and affect.  Intake/Output from previous day: 08/14 0701 - 08/15 0700 In: 1887.1 [I.V.:302.8; Blood:968.8; NG/GT:60; IV Piggyback:555.6] Out: 350 [Urine:350] Intake/Output this shift: Total I/O In: 106.8 [I.V.:23.3; IV Piggyback:83.4] Out: -   Lab Results: Recent Labs    04/07/20 0425 04/07/20 1625 04/08/20 0442  WBC 5.3 5.4 8.4  HGB 6.4* 8.3* 8.0*  HCT 19.6* 24.4* 24.3*  PLT 82* 94* 117*   BMET Recent Labs    04/07/20 0425 04/07/20 1625 04/08/20 0442  NA 146* 145 146*  K 3.0* 3.3* 2.8*  CL 107 107 108  CO2 26 28 28   GLUCOSE 100* 149* 128*  BUN 16 19 21*  CREATININE 1.24* 1.09* 0.98  CALCIUM 8.4* 8.1* 8.2*   LFT Recent Labs    04/08/20 0442  PROT 5.6*  ALBUMIN 3.1*  AST 657*  ALT 1,852*  ALKPHOS 144*  BILITOT 10.3*   PT/INR Recent Labs    04/07/20 1625 04/08/20 0442  LABPROT 35.7* 35.0*  INR 3.7* 3.6*   Hepatitis Panel No results for input(s): HEPBSAG, HCVAB, HEPAIGM, HEPBIGM in the last 72 hours.  CT HEAD WO CONTRAST  Result Date: 04/06/2020 CLINICAL DATA:  Mental status change EXAM: CT HEAD WITHOUT CONTRAST TECHNIQUE: Contiguous axial images were obtained from the base of the skull through the vertex without intravenous contrast. COMPARISON:  February 08, 2014, November 28, 2017 FINDINGS: Brain: No evidence of acute infarction, hemorrhage,  hydrocephalus, extra-axial collection or mass lesion/mass effect. Vascular: No hyperdense vessel or unexpected calcification. Skull: Normal. Negative for fracture or focal lesion. Sinuses/Orbits: No acute finding. Other: None. IMPRESSION: No acute intracranial abnormality. Electronically Signed   By: Meda Klinefelter MD   On: 04/06/2020 15:52   MR BRAIN W WO CONTRAST  Result Date: 04/06/2020 CLINICAL DATA:  Mental status change EXAM: MRI HEAD WITHOUT AND WITH CONTRAST TECHNIQUE: Multiplanar, multiecho pulse sequences of the brain and surrounding structures were obtained without and with intravenous contrast. CONTRAST:  6mL GADAVIST GADOBUTROL 1 MMOL/ML IV SOLN COMPARISON:  04/06/2020 head CT. FINDINGS: Brain: No focal parenchymal signal abnormality. No acute infarct or intracranial hemorrhage. No midline shift, ventriculomegaly or extra-axial fluid collection. No mass lesion. No abnormal enhancement. Vascular: Normal flow voids. Skull and upper cervical spine: Normal marrow signal. Tiny incidental left temporomandibular joint cyst. Sinuses/Orbits: Normal orbits. Minimal to mild pansinus mucosal thickening. No mastoid effusion. Other: None. IMPRESSION: No acute intracranial process. Mild pansinus disease. Electronically Signed   By: Stana Bunting M.D.   On: 04/06/2020 22:13   DG Chest Port 1 View  Result Date: 04/07/2020 CLINICAL DATA:  Acute respiratory failure EXAM: PORTABLE CHEST 1 VIEW COMPARISON:  04/06/2020 FINDINGS: Cardiac shadow is within normal limits. Endotracheal tube, gastric catheter and right jugular central line are again seen and stable. The lungs are clear bilaterally. No focal infiltrate is noted. IMPRESSION: Tubes and lines as described above.  No focal infiltrate is seen. Electronically Signed   By: Alcide Clever M.D.   On: 04/07/2020 06:02   Active Problems:   Acute hepatitis   Acute liver failure   Alcohol abuse   Thrombocytopenia (HCC)   Coagulopathy (HCC)   SIRS  (systemic inflammatory response syndrome) (HCC)   Acute hypoxemic respiratory failure (HCC)     LOS: 4 days   Willette Cluster ,NP 04/08/2020, 12:47 PM
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
## 2020-04-08 NOTE — Progress Notes (Signed)
This RN received a call from lab stating that not enough blood was drawn for [facility] to run a Factor 7. I spoke with Dr Sherene Sires and he said not to draw it again.

## 2020-04-08 NOTE — Progress Notes (Signed)
eLink Physician-Brief Progress Note Patient Name: YAA DONNELLAN DOB: [birth year] MRN: [id number]   Date of Service  04/08/2020  HPI/Events of Note  K 2.8. Encephalopathic. No enteral access. Has central line.   eICU Interventions  Ordered 60 mEq KCl to be given via central line.     Intervention Category Intermediate Interventions: Electrolyte abnormality - evaluation and management  Marveen Reeks Voncile Schwarz 04/08/2020, 6:03 AM

## 2020-04-08 NOTE — Progress Notes (Signed)
Someone claiming to be patients sister at nurses station, this person not on visiting list. This RN called father and was told by him that no one is to visit patient but himself and grandmother. They are the only two contacts on patients summary, he stated that pt's friends and "pimp" will try to get in to see patient.

## 2020-04-08 NOTE — Progress Notes (Signed)
eLink Physician-Brief Progress Note [name] DOB: 05/13/1991 MRN: [id number]   Date of Service  04/08/2020  HPI/Events of Note  Bladder scanned for 350cc. Has not had any other episodes of urinary retention thus far. Has Purewick in place currently. Also has cirrhosis -- ascites can affect reliability of bladder scan measurements.   eICU Interventions  Will repeat bladder scan in another 2 hours or so. If >400-450 detected, will straight cath first.     Intervention Category Minor Interventions: Routine modifications to care plan (e.g. PRN medications for pain, fever)  Marveen Reeks Kealy Lewter 04/08/2020, 4:48 AM

## 2020-04-08 NOTE — Progress Notes (Addendum)
Grandmother at bedside, pt responding to her by trying to open eyes and moving hands slightly. Pt biting on ET tube. Pt will not follow commands. Grandmother given patients earrings and ring. Patients phone remains at bedside.

## 2020-04-08 NOTE — Progress Notes (Signed)
NAME:  Gina Hooper, MRN:  [id number], DOB:  01/09/1991, LOS: 4 ADMISSION DATE:  04/04/2020, CONSULTATION DATE:  8/12 REFERRING MD:  Verlon Au , CHIEF COMPLAINT:  Acute encephalopathy    Brief History   29 year old female with history of severe alcoholic dependence, and polysubstance abuse admitted with chief complaint of abdominal pain nausea and vomiting on 8/11 found to be in acute liver failure in the setting of alcoholic hepatitis with profound coagulopathy as well as hematemesis.  Critical care asked to evaluate on 8/12 given declining mental status and concern about IV access  History of present illness   29 year old female with significant history of alcohol and polysubstance abuse she was admitted to the hospital on 8/11 with chief complaint of abdominal pain, nausea and vomiting. She had been taking Tylenol for severe pain. However as the pain became unbearable she called EMS. On arrival she was found to be hypoglycemic. Tachycardic, and nauseated. She was transferred to the emergency room and on admission found to have AST greater than 10,000, ALT greater than 4000. Platelet count of 40, Tylenol level less than 10. Urinary drug screen was positive for cocaine and cannabis . Alcohol level 61. He also had high anion gap metabolic acidosis with elevated lactate. Gastroenterology team was consulted, her pregnancy test was negative, hepatitis a and C were nonreactive. They felt her acute hepatitis was primarily secondary to acute alcoholic hepatitis plus/minus complicated by Tylenol ingestion. They had called transplant team at [facility], given her heavy substance and alcohol abuse history she was denied as a transplant candidate, and recommendations were to continue/start NAC infusion, initiate steroids when infection ruled out, and finally correct coagulopathy. Acetylcysteine bag was initiated while in the emergency room. Over the course of the evening from 8/11 to the morning of 8/12 she became  more encephalopathic , Has had 2 episodes of fairly large volume of emesis with frank blood.  She has become progressively incoherent receiving as needed dosing of Ativan.  Critical care was consulted because she was progressively encephalopathic with severe coagulopathy and ongoing liver failure.  There was concern about worsening airway compromise and IV access  Past Medical History  ETOH abuse, alcohol related hepatitis, anemia, prior osteo of left ankle. Substance abuse, coagulopathy   Significant Hospital Events   8/11 admitted.Discriminant fx score 304.  <MELD-Na 40, [name], t bili 4.8 on 04/04/20.  hepatomegly, non-specific GB wall thickening likely reactive on CT. she was seen by GI.  [facility] hepatology/transplant:.  Given her history of polysubstance abuse she is not a candidate for transplant.  Recommendations were to hydrate, correct coagulopathy, start acetylcysteine infusion for possible component of Tylenol toxicity on top of alcoholic hepatitis.  Recommended starting systemic steroids once systemic infection ruled out  Consults:  Gastroenterology consulted 8/11 Critical care consulted 9/11    Procedures:  R IJ  8/13> >> ET  8/13  >>>  Significant Diagnostic Tests:  8/11 drug screen   POS opiates/ cocaine / THC  8/11: CT abdomen: Borderline hepatomegaly with diffuse patchy low-attenuation with concern for acute inflammation/hepatitis.  There is gallbladder wall thickening with pericholecystic fluid felt reactive there were no gallstones.  There is a small right effusion.  Mild ascites in the pelvis. 8/14   MRI brain >> No acute intracranial process. Mild pansinus disease   Micro Data:  Covid 8/11  neg MRSA PCR 8/11  neg   Antimicrobials:    Scheduled Meds: . sodium chloride   Intravenous Once  . chlorhexidine  gluconate (MEDLINE KIT)  15 mL Mouth Rinse BID  . Chlorhexidine Gluconate Cloth  6 each Topical Daily  . folic acid  1 mg Intravenous Daily  . lactulose   30 g Per Tube 5 X Daily  . mouth rinse  15 mL Mouth Rinse 10 times per day  . methylPREDNISolone (SOLU-MEDROL) injection  40 mg Intravenous Q24H  . multivitamin with minerals  1 tablet Oral Daily  . pantoprazole  40 mg Intravenous Q12H  . sodium chloride flush  3 mL Intravenous Q12H  . thiamine  100 mg Oral Daily   Or  . thiamine  100 mg Intravenous Daily   Continuous Infusions: . sodium chloride Stopped (04/06/20 1017)  . potassium chloride 10 mEq (04/08/20 0936)   PRN Meds:.sodium chloride, LORazepam, metoprolol tartrate, [DISCONTINUED] ondansetron **OR** ondansetron (ZOFRAN) IV, polyethylene glycol, sodium chloride flush  Interim history/subjective:  Remains comatose off all sedation    Objective   Blood pressure 122/74, pulse 83, temperature 98.1 F (36.7 C), temperature source Axillary, resp. rate 16, height '5\' 6"'$  (1.676 m), weight 70.3 kg, last menstrual period 03/25/2020, SpO2 100 %.    Vent Mode: PRVC FiO2 (%):  [30 %-40 %] 30 % Set Rate:  [16 bmp] 16 bmp Vt Set:  [470 mL] 470 mL PEEP:  [5 cmH20] 5 cmH20 Plateau Pressure:  [13 cmH20-18 cmH20] 13 cmH20   Intake/Output Summary (Last 24 hours) at 04/08/2020 1038 Last data filed at 04/08/2020 0900 Gross per 24 hour  Intake 1305.83 ml  Output 350 ml  Net 955.83 ml   Filed Weights   04/04/20 0613 04/04/20 1648  Weight: 66.7 kg 70.3 kg    Examination: Tmax  99  Pt no resp to verbal or nox stimuli No jvd Oropharynx et in place  Neck supple Lungs with good bs  bilaterally RRR no s3 or or sign murmur Abd soft/ nl  excursion  Extr warm with no edema or clubbing noted       Resolved Hospital Problem list     Assessment & Plan:   Acute liver failure in the setting of acute alcoholic hepatitis plus/minus Tylenol toxicity -Seen by gastroenterology -Confirmed that she is not a transplant candidate -Discriminant fx score 304.  <MELD-Na 40, [name], t bili 4.8  Lab Results  Component Value Date   ALT  1,852 (H) 04/08/2020   AST 657 (H) 04/08/2020   ALKPHOS 144 (H) 04/08/2020   BILITOT 10.3 (H) 04/08/2020   bili trending up    Plan Finished  NAC  8/14 , continue  Solu-Medrol Supportive care  UGIB w/ hematemesis/ blood loss anemia  Not felt to be variceal bleed.  Likely secondary to gastritis   Lab Results  Component Value Date   HGB 8.0 (L) 04/08/2020   HGB 8.3 (L) 04/07/2020   HGB 6.4 (LL) 04/07/2020    Plan PPI infusion  transfuse 8/14 for hgb < 7    Severe coagulopathy in the setting of acute liver failure. Lab Results  Component Value Date   INR 3.6 (H) 04/08/2020   INR 3.7 (H) 04/07/2020   INR 3.9 (H) 04/07/2020    Plan S/p FFP Vitamin K Trend INR/ hold further FFP for now    Lactic acidosis is setting of organ hypoperfusion and poor hepatic clearance  Plan Supportive care    Thrombocytopenia in setting liver disease Lab Results  Component Value Date   PLT 117 (L) 04/08/2020   PLT 94 (L) 04/07/2020   PLT 82 (L)  04/07/2020   trending back up now Plan Trend cbc Transfuse as indicated if PLTs <25 or < 50 if actively bleeding  AKI Lab Results  Component Value Date   CREATININE 0.98 04/08/2020   CREATININE 1.09 (H) 04/07/2020   CREATININE 1.24 (H) 04/07/2020   improved Plan Ensure volume resuscitation Renal dose meds Serial chemistries   Acute hepatic encephalopathy, further complicated acute metabolic encephalopathy in setting of alcohol withdrawal. Acute worsening today.  Probably recent should not get lactulose overnight Plan  lactulose via NG tube Holding sedating medication Continue thiamine, folate MRI 8/13 neg  Will need neuro eval at this point but appears to have very poor prognosis   Hypokalemia/ hypophosphatemia  >> Kphos 49mol am 8/14  > recheck phos  8/15 as K already addressed by E link   Best practice:  Diet: NPO Pain/Anxiety/Delirium protocol (if indicated): holding sedation  VAP protocol (if indicated):   DVT  prophylaxis: scd GI prophylaxis: ppi gtt Glucose control: NA Mobility: BR Code Status: full code  Family Communication: pending  Disposition: ICU   The patient is critically ill with multiple organ systems failure and requires high complexity decision making for assessment and support, frequent evaluation and titration of therapies, application of advanced monitoring technologies and extensive interpretation of multiple databases. Critical Care Time devoted to patient care services described in this note is 35 minutes.    MChristinia Gully MD Pulmonary and [phone]  After 7:00 pm call Elink  [phone]

## 2020-04-09 ENCOUNTER — Inpatient Hospital Stay (HOSPITAL_COMMUNITY)
Admit: 2020-04-09 | Discharge: 2020-04-09 | Disposition: A | Discharge status: Home / Self Care | Payer: Medicaid Other | Attending: Neurology | Admitting: Neurology

## 2020-04-09 DIAGNOSIS — K7201 Acute and subacute hepatic failure with coma: Secondary | ICD-10-CM | POA: Diagnosis not present

## 2020-04-09 DIAGNOSIS — R569 Unspecified convulsions: Secondary | ICD-10-CM

## 2020-04-09 DIAGNOSIS — F1910 Other psychoactive substance abuse, uncomplicated: Secondary | ICD-10-CM

## 2020-04-09 DIAGNOSIS — J9601 Acute respiratory failure with hypoxia: Secondary | ICD-10-CM

## 2020-04-09 LAB — CBC WITH DIFFERENTIAL/PLATELET
Abs Immature Granulocytes: 0.18 10*3/uL — ABNORMAL HIGH (ref 0.00–0.07)
Basophils Absolute: 0 10*3/uL (ref 0.0–0.1)
Basophils Relative: 0 %
Eosinophils Absolute: 0 10*3/uL (ref 0.0–0.5)
Eosinophils Relative: 0 %
HCT: 25.8 % — ABNORMAL LOW (ref 36.0–46.0)
Hemoglobin: 8.5 g/dL — ABNORMAL LOW (ref 12.0–15.0)
Immature Granulocytes: 2 %
Lymphocytes Relative: 7 %
Lymphs Abs: 0.8 10*3/uL (ref 0.7–4.0)
MCH: 26.6 pg (ref 26.0–34.0)
MCHC: 32.9 g/dL (ref 30.0–36.0)
MCV: 80.6 fL (ref 80.0–100.0)
Monocytes Absolute: 2.4 10*3/uL — ABNORMAL HIGH (ref 0.1–1.0)
Monocytes Relative: 22 %
Neutro Abs: 7.8 10*3/uL — ABNORMAL HIGH (ref 1.7–7.7)
Neutrophils Relative %: 69 %
Platelets: 137 10*3/uL — ABNORMAL LOW (ref 150–400)
RBC: 3.2 MIL/uL — ABNORMAL LOW (ref 3.87–5.11)
RDW: 18.1 % — ABNORMAL HIGH (ref 11.5–15.5)
WBC: 11.2 10*3/uL — ABNORMAL HIGH (ref 4.0–10.5)
nRBC: 1.5 % — ABNORMAL HIGH (ref 0.0–0.2)

## 2020-04-09 LAB — COMPREHENSIVE METABOLIC PANEL
ALT: 1254 U/L — ABNORMAL HIGH (ref 0–44)
AST: 273 U/L — ABNORMAL HIGH (ref 15–41)
Albumin: 3 g/dL — ABNORMAL LOW (ref 3.5–5.0)
Alkaline Phosphatase: 140 U/L — ABNORMAL HIGH (ref 38–126)
Anion gap: 10 (ref 5–15)
BUN: 26 mg/dL — ABNORMAL HIGH (ref 6–20)
CO2: 27 mmol/L (ref 22–32)
Calcium: 8.3 mg/dL — ABNORMAL LOW (ref 8.9–10.3)
Chloride: 111 mmol/L (ref 98–111)
Creatinine, Ser: 0.86 mg/dL (ref 0.44–1.00)
GFR calc Af Amer: >60 mL/min (ref >60)
GFR calc non Af Amer: >60 mL/min (ref >60)
Glucose, Bld: 142 mg/dL — ABNORMAL HIGH (ref 70–99)
Potassium: 3.5 mmol/L (ref 3.5–5.1)
Sodium: 148 mmol/L — ABNORMAL HIGH (ref 135–145)
Total Bilirubin: 12.9 mg/dL — ABNORMAL HIGH (ref 0.3–1.2)
Total Protein: 5.6 g/dL — ABNORMAL LOW (ref 6.5–8.1)

## 2020-04-09 LAB — GLUCOSE, CAPILLARY
Glucose-Capillary: 130 mg/dL — ABNORMAL HIGH (ref 70–99)
Glucose-Capillary: 124 mg/dL — ABNORMAL HIGH (ref 70–99)
Glucose-Capillary: 122 mg/dL — ABNORMAL HIGH (ref 70–99)
Glucose-Capillary: 131 mg/dL — ABNORMAL HIGH (ref 70–99)
Glucose-Capillary: 150 mg/dL — ABNORMAL HIGH (ref 70–99)
Glucose-Capillary: 172 mg/dL — ABNORMAL HIGH (ref 70–99)

## 2020-04-09 LAB — PROTIME-INR
INR: 3.9 — ABNORMAL HIGH (ref 0.8–1.2)
Prothrombin Time: 37 seconds — ABNORMAL HIGH (ref 11.4–15.2)

## 2020-04-09 LAB — PHOSPHORUS
Phosphorus: 2.6 mg/dL (ref 2.5–4.6)
Phosphorus: 2.1 mg/dL — ABNORMAL LOW (ref 2.5–4.6)

## 2020-04-09 LAB — MAGNESIUM
Magnesium: 2.7 mg/dL — ABNORMAL HIGH (ref 1.7–2.4)
Magnesium: 2.7 mg/dL — ABNORMAL HIGH (ref 1.7–2.4)

## 2020-04-09 MED ORDER — PROSOURCE TF PO LIQD
45.0000 mL | Freq: Two times a day (BID) | ORAL | Status: DC
  Filled 2020-04-09: qty 45

## 2020-04-09 MED ORDER — POTASSIUM CHLORIDE 20 MEQ/15ML (10%) PO SOLN
40.0000 meq | Freq: Once | ORAL | Status: AC
  Administered 2020-04-09: 40 meq
  Filled 2020-04-09: qty 30

## 2020-04-09 MED ORDER — VITAL AF 1.2 CAL PO LIQD
1000.0000 mL | ORAL | Status: DC
  Administered 2020-04-09 – 2020-04-12 (×3): 1000 mL

## 2020-04-09 MED ORDER — LACTATED RINGERS IV BOLUS
1000.0000 mL | Freq: Once | INTRAVENOUS | Status: AC
  Administered 2020-04-09: 1000 mL via INTRAVENOUS

## 2020-04-09 MED ORDER — VITAL HIGH PROTEIN PO LIQD: 1000.0000 mL | ORAL | Status: DC

## 2020-04-09 MED ORDER — PROSOURCE TF PO LIQD
45.0000 mL | Freq: Three times a day (TID) | ORAL | Status: DC
  Administered 2020-04-09 – 2020-04-14 (×17): 45 mL
  Filled 2020-04-09 (×18): qty 45

## 2020-04-09 MED ORDER — ADULT MULTIVITAMIN LIQUID CH
15.0000 mL | Freq: Every day | ORAL | Status: DC
  Administered 2020-04-09 – 2020-04-14 (×6): 15 mL
  Filled 2020-04-09 (×6): qty 15

## 2020-04-09 MED ORDER — VITAMIN K1 10 MG/ML IJ SOLN
5.0000 mg | Freq: Every day | INTRAMUSCULAR | Status: AC
  Administered 2020-04-09 – 2020-04-10 (×2): 5 mg via INTRAMUSCULAR
  Filled 2020-04-09 (×3): qty 0.5

## 2020-04-09 NOTE — Procedures (Signed)
[name]  MRN: [id number]  Epilepsy Attending: Charlsie Quest  Referring Physician/Provider: Dr. Brooke Dare Date: 04/09/2020 Duration: 23.36 minutes  Patient history: 29 year old female with history of alcohol and drug abuse with altered mental status.  EEG to evaluate for seizures.  Level of alertness: Comatose  AEDs during EEG study: None  Technical aspects: This EEG study was done with scalp electrodes positioned according to the 10-20 International system of electrode placement. Electrical activity was acquired at a sampling rate of 500Hz  and reviewed with a high frequency filter of 70Hz  and a low frequency filter of 1Hz . EEG data were recorded continuously and digitally stored.   Description: EEG showed continuous rhythmic 2 to 3 Hz generalized delta activity.  EEG was reactive to tactile stimulation.  Hyperventilation and photic stimulation were not performed.     ABNORMALITY -Continuous rhythmic delta slowing, generalized  IMPRESSION: This study is suggestive of severe diffuse encephalopathy, nonspecific etiology.  No seizures or epileptiform discharges were seen throughout the recording.   Gina Hooper 

## 2020-04-09 NOTE — Progress Notes (Addendum)
Patient ID: Gina Hooper, female   DOB: Aug 07, 1991, 29 y.o.   MRN: [id number]    Progress Note   Subjective   Day # 5  CC acute hepatitis polysubstance abuse  INR 3.9 WBC 11.2, hemoglobin 8.5, platelets 137 Creatinine 0.86 T bili 12.9/alk phos 140/ALT 1254/AST 26  M ELD 40 on admit Discriminant function 304 Completed NAC 814 On Solu-Medrol 40 IV daily  For EEG today  Intubated, nonresponsive to verbal or physical stimuli    Objective   Vital signs in last 24 hours: Temp:  [98.3 F (36.8 C)-99.9 F (37.7 C)] 98.3 F (36.8 C) (08/16 0400) Pulse Rate:  [79-113] 90 (08/16 0800) Resp:  [10-17] 16 (08/16 0800) BP: (114-150)/(71-101) 141/95 (08/16 0800) SpO2:  [97 %-100 %] 100 % (08/16 0800) FiO2 (%):  [30 %] 30 % (08/16 0800) Last BM Date: 04/08/20 General:    [place] African-American female, intubated, unresponsive Heart: Tacky regular rate and rhythm; no murmurs Lungs: Respirations coarse and unlabored, lungs CTA bilaterally Abdomen:  Soft, nontender and nondistended. Normal bowel sounds. Extremities:  Without edema. Neurologic: Obtunded Psych: .  Intake/Output from previous day: 08/15 0701 - 08/16 0700 In: 242.4 [I.V.:23.3; IV Piggyback:219] Out: 0  Intake/Output this shift: No intake/output data recorded.  Lab Results: Recent Labs    04/08/20 0442 04/08/20 1627 04/09/20 0417  WBC 8.4 9.6 11.2*  HGB 8.0* 8.7* 8.5*  HCT 24.3* 26.3* 25.8*  PLT 117* 125* 137*   BMET Recent Labs    04/08/20 0442 04/08/20 1627 04/09/20 0417  NA 146* 149* 148*  K 2.8* 3.6 3.5  CL 108 110 111  CO2 '28 27 27  '$ GLUCOSE 128* 138* 142*  BUN 21* 24* 26*  CREATININE 0.98 0.83 0.86  CALCIUM 8.2* 8.7* 8.3*   LFT Recent Labs    04/09/20 0417  PROT 5.6*  ALBUMIN 3.0*  AST 273*  ALT 1,254*  ALKPHOS 140*  BILITOT 12.9*   PT/INR Recent Labs    04/08/20 1627 04/09/20 0417  LABPROT 32.4* 37.0*  INR 3.3* 3.9*    Studies/Results: No results found.      Assessment / Plan:    #64 29 year old African-American female with history of polysubstance abuse/EtOH abuse with acute hepatic failure, unclear if all secondary to acute EtOH hepatitis, plus minus Tylenol toxicity, possible component of ischemia in setting of cocaine use  She has been on maximal supportive care. Continue IV Solu-Medrol. Not a candidate for transplant based on polysubstance abuse No improvement in hepatic parameters, INR continues to rise  #2 history of hematemesis, no active bleeding over the past 24 hours Continue IV PPI twice daily Transfuse for hemoglobin less than 7  #3 severe coagulopathy secondary to above-continue vitamin K #4 metabolic encephalopathy/possible hepatic encephalopathy EEG to be done today Continuing high-dose lactulose which is resulting in significant diarrhea Will continue another 24 hours  Prognosis very poor  Addendum: Above note written by Nicoletta Ba, PAC I have reviewed the chart and examined the patient. I agree with the Advanced Practitioner's note and impression. EEG pending Very poor prognosis in the setting of alcohol and polysubstance abuse with hepatic failure.  Not a candidate for transplant due to alcohol and polysubstance abuse. Coagulopathy continues to worsen which is a very poor prognostic sign She remains nearly comatose without sedation despite bowel movements with lactulose, also a poor prognostic sign. We will follow      Active Problems:   Acute hepatitis   Acute liver failure   Alcohol abuse  Thrombocytopenia (HCC)   Coagulopathy (HCC)   SIRS (systemic inflammatory response syndrome) (HCC)   Acute hypoxemic respiratory failure ([place])     LOS: 5 days   Amy Esterwood PA-C 04/09/2020, 8:35 AM

## 2020-04-09 NOTE — Progress Notes (Signed)
NAME:  Gina Hooper, MRN:  [id number], DOB:  19-Oct-1990, LOS: 5 ADMISSION DATE:  04/04/2020, CONSULTATION DATE:  8/12 REFERRING MD:  Mahala Menghini , CHIEF COMPLAINT:  Acute encephalopathy    Brief History   29 year old female with history of severe alcoholic dependence, and polysubstance abuse admitted with chief complaint of abdominal pain nausea and vomiting on 8/11 found to be in acute liver failure in the setting of alcoholic hepatitis with profound coagulopathy as well as hematemesis.  Critical care asked to evaluate on 8/12 given declining mental status and concern about IV access.  History of present illness   29 year old female with significant history of alcohol and polysubstance abuse she was admitted to the hospital on 8/11 with chief complaint of abdominal pain, nausea and vomiting. She had been taking Tylenol for severe pain. However as the pain became unbearable she called EMS. On arrival she was found to be hypoglycemic. Tachycardic, and nauseated. She was transferred to the emergency room and on admission found to have AST greater than 10,000, ALT greater than 4000. Platelet count of 40, Tylenol level less than 10. Urinary drug screen was positive for cocaine and cannabis . Alcohol level 61. He also had high anion gap metabolic acidosis with elevated lactate. Gastroenterology team was consulted, her pregnancy test was negative, hepatitis a and C were nonreactive. They felt her acute hepatitis was primarily secondary to acute alcoholic hepatitis plus/minus complicated by Tylenol ingestion. They had called transplant team at [facility], given her heavy substance and alcohol abuse history she was denied as a transplant candidate, and recommendations were to continue/start NAC infusion, initiate steroids when infection ruled out, and finally correct coagulopathy. Acetylcysteine bag was initiated while in the emergency room. Over the course of the evening from 8/11 to the morning of 8/12 she became  more encephalopathic , Has had 2 episodes of fairly large volume of emesis with frank blood.  She has become progressively incoherent receiving as needed dosing of Ativan.  Critical care was consulted because she was progressively encephalopathic with severe coagulopathy and ongoing liver failure.  There was concern about worsening airway compromise and IV access  Past Medical History  ETOH abuse, alcohol related hepatitis, anemia, prior osteo of left ankle. Substance abuse, coagulopathy   Significant Hospital Events   8/11 admitted. Discriminant fx score 304. MELD-Na 40, [name], t bili 4.8.  Hepatomegly, non-specific GB wall thickening likely reactive on CT. she was seen by GI.  [facility] hepatology/transplant: she is not a candidate for transplant.  Recommendations were to hydrate, correct coagulopathy, start acetylcysteine infusion for possible component of Tylenol toxicity on top of alcoholic hepatitis.  Recommended starting systemic steroids once systemic infection ruled out 8/16 Remains on vent, not waking  Consults:  Gastroenterology consulted 8/11 Critical care consulted 9/11    Procedures:  R IJ  8/13 >> ETT  8/13 >>  Significant Diagnostic Tests:  UDS 8/11 >> opiate / THC / cocaine positive  CT ABD 8/11 >> borderline hepatomegaly with diffuse patchy low-attenuation with concern for acute inflammation/hepatitis.  Gallbladder wall thickening with pericholecystic fluid felt reactive there were no gallstones.  There is a small right effusion.  Mild ascites in the pelvis. MRI Brain 8/14 >> no acute intracranial process. Mild pansinus disease   Micro Data:  COVID 8/11 >> neg MRSA PCR 8/11 >> neg   Antimicrobials:    Interim history/subjective:  Remains off sedation  Tmax 99.9  / WBC 11.2  I/O 350 ml UOP, +1.5L in  last 24 hours Glucose range 106 - 142  Objective   Blood pressure (!) 141/95, pulse 90, temperature 98.3 F (36.8 C), temperature source Axillary, resp. rate  16, height 5\' 6"  (1.676 m), weight 70.3 kg, last menstrual period 03/25/2020, SpO2 100 %.    Vent Mode: PRVC FiO2 (%):  [30 %] 30 % Set Rate:  [16 bmp] 16 bmp Vt Set:  [470 mL] 470 mL PEEP:  [5 cmH20] 5 cmH20 Plateau Pressure:  [13 cmH20-17 cmH20] 16 cmH20   Intake/Output Summary (Last 24 hours) at 04/09/2020 04/11/2020 Last data filed at 04/09/2020 04/11/2020 Gross per 24 hour  Intake 135.58 ml  Output 0 ml  Net 135.58 ml   Filed Weights   04/04/20 0613 04/04/20 1648  Weight: 66.7 kg 70.3 kg    Examination: General: young adult female lying in bed in NAD on vent  HEENT: MM pink/moist, ETT, pupils 72mm sluggishly reactive, bloody sclera, icterus  Neuro: no response to verbal / painful stimuli, + corneal, cough with stimulation / intermittently will bite down on ETT CV: s1s2 RRR, no m/r/g PULM: non-labored on PRVC, lungs bilaterally clear  GI: soft, bsx4 active  Extremities: warm/dry, BUE trace edema  Skin: no rashes or lesions  Resolved Hospital Problem list     Assessment & Plan:   Acute Liver Failure in the setting of Acute Alcoholic Hepatitis +/- Tylenol Toxicity MELD-Na score 40 on admit, [name], T.Bili 4.8, coagulapathy. Discriminant fx score 304.  Completed NAC 8/14.  -solumedrol 40 mg IV Q24 -appreciate GI assistance with patient care -not a candidate for transplant based on polysubstance abuse  -continue lactulose -supportive care   UGIB w/ Hematemesis Acute Blood Loss Anemia  Not felt to be variceal bleed.  Likely secondary to gastritis -continue PPI BID  -transfuse for Hgb <7% or obvious active bleeding  -trend CBC   Severe Coagulopathy in the setting of Acute Liver Failure S/P FFP, Vit K  -follow INR daily  -concerning with persistently elevated INR that the liver injury may not be reversible   Lactic Acidosis  In setting of organ hypoperfusion and poor hepatic clearance  -follow trend  Thrombocytopenia in setting Liver Disease -improving, follow  CBC  AKI -Trend BMP  -monitor urinary output closely  -Replace electrolytes as indicated -Avoid nephrotoxic agents, ensure adequate renal perfusion -renal dose medications   Acute Hepatic / Metabolic Encephalopathy Alcohol Withdrawal. MRI negative 8/13.  -appreciate Neurology input / assistance with patient care -continue lactulose  -minimize all sedating medications  -continue thiamine, folate, MVI -await EEG  -concerning with overall picture for poor prognosis   Hypokalemia Hypophosphatemia  -monitor, replace as indicated  Moderate Protein Calorie Malnutrition  -begin TF per protocol   Best practice:  Diet: NPO Pain/Anxiety/Delirium protocol (if indicated): holding sedation  VAP protocol (if indicated): in place  DVT prophylaxis: scd GI prophylaxis: PPI  Glucose control: NA Mobility: BR Code Status: full code  Family Communication: Father Klosinski) updated via phone 8/16 Disposition: ICU   CC Time: 35 minutes   9/16, MSN, NP-C [facility] 04/09/2020, 9:49 AM   Please see [email or web address] for pager details.

## 2020-04-09 NOTE — Progress Notes (Addendum)
Initial Nutrition Assessment  DOCUMENTATION CODES:   Not applicable  INTERVENTION:  - will order TF: Vital AF 1.2 @ 25 ml/hr to advance by 10 ml every 8 hours to reach goal rate of 55 ml/hr with 45 ml Prosource TF TID. - at goal rate, this regimen will provide 1704 kcal, 132 grams protein, and 1070 ml free water. - free water flush to be per CCM.   Monitor magnesium, potassium, and phosphorus daily for at least 3 days, MD to replete as needed, as pt is at risk for refeeding syndrome given hx of alcohol/polysubstance abuse, no known nutrition for at least 5 days.    NUTRITION DIAGNOSIS:   Inadequate oral intake related to inability to eat as evidenced by NPO status.  GOAL:   Patient will meet greater than or equal to 90% of their needs  MONITOR:   Vent status, TF tolerance, Labs, Weight trends  REASON FOR ASSESSMENT:   Ventilator, Consult Enteral/tube feeding initiation and management  ASSESSMENT:   29 year old female with medical history of severe alcoholic dependence and polysubstance abuse. She presented to the ED on 8/11 with abdominal pain and N/V. She was found to be in acute liver failure in the setting of alcoholic hepatitis with profound coagulopathy and hematemesis. CCM evaluated 8/12 due to declining mental status and concern about IV access.  Diet changed from Regular to Soft on 8/11 at 1700 and then made NPO on 8/12 at 0727; no intakes documented from 8/11. She has not been weighed since 8/11 when weight was recorded as both 66.7 kg/147 b and 70.3 kg/155 lb. The most recently documented weight PTA was on 08/07/19 when she weighed 74.8 kg/164 lb. Used weight of 70.3 kg to estimate needs.  No family/visitors present at bedside. Able to talk with RN briefly. Patient is intubated and OGT is in place; currently clamped.   Per notes: - acute liver failure--not a candidate for transplant d/t hx of polysubstance abuse - acute alcoholic hepatitis - UGIB with hematemesis  thought to be 2/2 gastritis - severe coagulapthy - AKI - acute hepatic and metabolic encephalopathy with signs of alcohol withdrawal  - moderate PCM   Patient is currently intubated on ventilator support MV: 7.5 L/min Temp (24hrs), Avg:98.7 F (37.1 C), Min:98.3 F (36.8 C), Max:99.9 F (37.7 C) Propofol: none BP: 160/107 and MAP: 122  Labs reviewed; CBGs: 130 and 124 mg/dl, Na: 148 mmol/l, BUN: 26 mg/dl, Ca: 8.3 mg/dl, Alk Phos elevated, LFTs elevated but trending down.  Medications reviewed; 1 mg IV folic acid/day, 40 mg solu-medrol/day, 1 tablet multivitamin with minerals/day, 40 mg IV protonix BID, 5 mg IM vitamin K x1 dose 8/16 and x1 dose 8/17, 10 mEq IV KCl x6 runs 8/15, 40 mEq KCl x1 dose 8/16, 100 mg thiamine/day.     NUTRITION - FOCUSED PHYSICAL EXAM:  completed; no muscle or fat depletions, mild edema to BUE.   Diet Order:   Diet Order            Diet NPO time specified  Diet effective now                 EDUCATION NEEDS:   No education needs have been identified at this time  Skin:  Skin Assessment: Reviewed RN Assessment  Last BM:  8/16 (type 7 x2)  Height:   Ht Readings from Last 1 Encounters:  04/04/20 '5\' 6"'$  (1.676 m)    Weight:   Wt Readings from Last 1 Encounters:  04/04/20 70.3  kg     Estimated Nutritional Needs:  Kcal:  1705 kcal Protein:  105-140 grams (1.5-2 grams/kg) Fluid:  >/= 2.2 L/day     Jarome Matin, MS, RD, LDN, CNSC Inpatient Clinical Dietitian RD pager # available in AMION  After hours/weekend pager # available in [facility]

## 2020-04-09 NOTE — Progress Notes (Signed)
eLink Physician-Brief Progress Note [name] DOB: [birth year] MRN: [id number]   Date of Service  04/09/2020  HPI/Events of Note  Nurssing concerned about sinus Tachycardia. HR = 116. Temp = 99.9, Hgb = 8.5. Volume depleation?  eICU Interventions  Plan: 1. Monitor CVP now and Q 4 hours.  2. Bolus with LR 1 liter IV over 1 hour now.      Intervention Category Major Interventions: Arrhythmia - evaluation and management  Lenoard Helbert Dennard Nip 04/09/2020, 10:37 PM

## 2020-04-09 NOTE — Progress Notes (Signed)
eLink Physician-Brief Progress Note [name] DOB: [birth year] MRN: [id number]   Date of Service  04/09/2020  HPI/Events of Note  Frequent loose stools - Request for Flexiseal.   eICU Interventions  Will order: 1. Place Flexiseal.      Intervention Category Major Interventions: Other:  Lenell Antu 04/09/2020, 5:04 AM

## 2020-04-09 NOTE — Progress Notes (Signed)
EEG Completed; Results Pending  

## 2020-04-10 DIAGNOSIS — F1010 Alcohol abuse, uncomplicated: Secondary | ICD-10-CM | POA: Diagnosis not present

## 2020-04-10 DIAGNOSIS — J9600 Acute respiratory failure, unspecified whether with hypoxia or hypercapnia: Secondary | ICD-10-CM | POA: Diagnosis not present

## 2020-04-10 DIAGNOSIS — K7201 Acute and subacute hepatic failure with coma: Secondary | ICD-10-CM | POA: Diagnosis not present

## 2020-04-10 LAB — COMPREHENSIVE METABOLIC PANEL
ALT: 860 U/L — ABNORMAL HIGH (ref 0–44)
AST: 151 U/L — ABNORMAL HIGH (ref 15–41)
Albumin: 2.8 g/dL — ABNORMAL LOW (ref 3.5–5.0)
Alkaline Phosphatase: 145 U/L — ABNORMAL HIGH (ref 38–126)
Anion gap: 6 (ref 5–15)
BUN: 28 mg/dL — ABNORMAL HIGH (ref 6–20)
CO2: 27 mmol/L (ref 22–32)
Calcium: 8.5 mg/dL — ABNORMAL LOW (ref 8.9–10.3)
Chloride: 119 mmol/L — ABNORMAL HIGH (ref 98–111)
Creatinine, Ser: 0.74 mg/dL (ref 0.44–1.00)
GFR calc Af Amer: >60 mL/min (ref >60)
GFR calc non Af Amer: >60 mL/min (ref >60)
Glucose, Bld: 180 mg/dL — ABNORMAL HIGH (ref 70–99)
Potassium: 3.7 mmol/L (ref 3.5–5.1)
Sodium: 152 mmol/L — ABNORMAL HIGH (ref 135–145)
Total Bilirubin: 16 mg/dL — ABNORMAL HIGH (ref 0.3–1.2)
Total Protein: 5.7 g/dL — ABNORMAL LOW (ref 6.5–8.1)

## 2020-04-10 LAB — GLUCOSE, CAPILLARY
Glucose-Capillary: 156 mg/dL — ABNORMAL HIGH (ref 70–99)
Glucose-Capillary: 162 mg/dL — ABNORMAL HIGH (ref 70–99)
Glucose-Capillary: 166 mg/dL — ABNORMAL HIGH (ref 70–99)
Glucose-Capillary: 157 mg/dL — ABNORMAL HIGH (ref 70–99)
Glucose-Capillary: 163 mg/dL — ABNORMAL HIGH (ref 70–99)
Glucose-Capillary: 187 mg/dL — ABNORMAL HIGH (ref 70–99)

## 2020-04-10 LAB — CBC
HCT: 28.1 % — ABNORMAL LOW (ref 36.0–46.0)
Hemoglobin: 9.3 g/dL — ABNORMAL LOW (ref 12.0–15.0)
MCH: 27 pg (ref 26.0–34.0)
MCHC: 33.1 g/dL (ref 30.0–36.0)
MCV: 81.7 fL (ref 80.0–100.0)
Platelets: 155 10*3/uL (ref 150–400)
RBC: 3.44 MIL/uL — ABNORMAL LOW (ref 3.87–5.11)
RDW: 19.9 % — ABNORMAL HIGH (ref 11.5–15.5)
WBC: 20 10*3/uL — ABNORMAL HIGH (ref 4.0–10.5)
nRBC: 2.4 % — ABNORMAL HIGH (ref 0.0–0.2)

## 2020-04-10 LAB — PROTIME-INR
INR: 2.9 — ABNORMAL HIGH (ref 0.8–1.2)
Prothrombin Time: 29 seconds — ABNORMAL HIGH (ref 11.4–15.2)

## 2020-04-10 LAB — PHOSPHORUS
Phosphorus: 2.1 mg/dL — ABNORMAL LOW (ref 2.5–4.6)
Phosphorus: 3 mg/dL (ref 2.5–4.6)

## 2020-04-10 LAB — MAGNESIUM
Magnesium: 2.9 mg/dL — ABNORMAL HIGH (ref 1.7–2.4)
Magnesium: 2.7 mg/dL — ABNORMAL HIGH (ref 1.7–2.4)

## 2020-04-10 LAB — HSV(HERPES SIMPLEX VRS) I + II AB-IGM: HSVI/II Comb IgM: 0.99 Ratio — ABNORMAL HIGH (ref 0.00–0.90)

## 2020-04-10 MED ORDER — FREE WATER
200.0000 mL | Status: DC
  Administered 2020-04-10 – 2020-04-11 (×7): 200 mL

## 2020-04-10 NOTE — Progress Notes (Signed)
Patient ID: Gina Hooper, female   DOB: 1991-07-30, 29 y.o.   MRN: [id number]    Progress Note   Subjective   day # 6  CC; acute hepatitis, polysubstance abuse, hepatic failure  IV Solu-Medrol 40 mg Lactulose  WBC 20, hemoglobin 9.3, platelets 155 INR 2.9-improved Creatinine 0.74 T bili 16/alk phos 145/AST 151/ALT 36  M ELD-29 DF score-24  EEG-suggestive of severe diffuse encephalopathy nonspecific etiology  Patient remains intubated and nonresponsive  Significant diarrhea secondary to lactulose requiring Flexi-Seal   Objective   Vital signs in last 24 hours: Temp:  [98.3 F (36.8 C)-99.9 F (37.7 C)] 99.9 F (37.7 C) (08/17 0820) Pulse Rate:  [87-137] 123 (08/17 0800) Resp:  [14-19] 17 (08/17 0800) BP: (130-160)/(89-122) 137/96 (08/17 0800) SpO2:  [99 %-100 %] 100 % (08/17 0809) FiO2 (%):  [30 %] 30 % (08/17 0809) Last BM Date: 04/09/20 General:    Young African-American female intubated, nonresponsive Heart: Tacky regular rate and rhythm; no murmurs Lungs: Respirations even and unlabored, lungs CTA bilaterally Abdomen:  Soft, nontender and nondistended. Normal bowel sounds. Extremities:  Without edema. Neurologic: Nonresponsive Intake/Output from previous day: 08/16 0701 - 08/17 0700 In: 1613.3 [I.V.:90; NG/GT:523.3; IV Piggyback:1000] Out: 1200 [Urine:400; Stool:800] Intake/Output this shift: No intake/output data recorded.  Lab Results: Recent Labs    04/08/20 1627 04/09/20 0417 04/10/20 0525  WBC 9.6 11.2* 20.0*  HGB 8.7* 8.5* 9.3*  HCT 26.3* 25.8* 28.1*  PLT 125* 137* 155   BMET Recent Labs    04/08/20 1627 04/09/20 0417 04/10/20 0525  NA 149* 148* 152*  K 3.6 3.5 3.7  CL 110 111 119*  CO2 _0 GLUCOSE 138* 142* 180*  BUN 24* 26* 28*  CREATININE 0.83 0.86 0.74  CALCIUM 8.7* 8.3* 8.5*   LFT Recent Labs    04/10/20 0525  PROT 5.7*  ALBUMIN 2.8*  AST 151*  ALT 860*  ALKPHOS 145*  BILITOT 16.0*   PT/INR Recent Labs      04/09/20 0417 04/10/20 0525  LABPROT 37.0* 29.0*  INR 3.9* 2.9*    Studies/Results: EEG  Result Date: 04/09/2020 Gina Havens, MD     04/09/2020 12:06 PM [name] MRN: [id number] Epilepsy Attending: Lora Hooper Referring Physician/Provider: Dr. Lesleigh Hooper Date: 04/09/2020 Duration: 23.36 minutes Patient history: 29 year old female with history of alcohol and drug abuse with altered mental status.  EEG to evaluate for seizures. Level of alertness: Comatose AEDs during EEG study: None Technical aspects: This EEG study was done with scalp electrodes positioned according to the 10-20 International system of electrode placement. Electrical activity was acquired at a sampling rate of _1  and reviewed with a high frequency filter of _2  and a low frequency filter of _3 . EEG data were recorded continuously and digitally stored. Description: EEG showed continuous rhythmic 2 to 3 Hz generalized delta activity.  EEG was reactive to tactile stimulation.  Hyperventilation and photic stimulation were not performed.   ABNORMALITY -Continuous rhythmic delta slowing, generalized IMPRESSION: This study is suggestive of severe diffuse encephalopathy, nonspecific etiology.  No seizures or epileptiform discharges were seen throughout the recording. Gina Hooper       Assessment / Plan:    #41 29 year old African-American female admitted 04/04/2020 after presenting with abdominal pain nausea and vomiting, ill for a few days prior to presentation.  Patient with history of polysubstance abuse, EtOH/cocaine/THC and opioids. Patient found to be in acute hepatic failure-unclear if this is all  secondary to acute alcohol induced hepatitis plus minus component of Tylenol toxicity, and also concern for component of shock liver/ischemia in setting of cocaine use.  Continue maximal supportive care Continue IV Solu-Medrol.  She has had some improvement in M ELD and discriminant  function scores.  Not a candidate for transplant based on polysubstance abuse  #2 history of hematemesis-no active bleeding over the past 48 hours Continue to trend hemoglobin. Continue IV PPI twice daily Transfuse for hemoglobin less than 7  #3 severe coagulopathy secondary to above, restarted vitamin K, INR down a bit today #4 persistent nonresponsiveness-metabolic encephalopathy Neuro following She has been on high-dose lactulose without any improvement, will decrease lactulose to 3 times daily  Overall prognosis remains quite poor.    Active Problems:   Hepatitis   Acute liver failure   Alcohol abuse   Thrombocytopenia (HCC)   Coagulopathy (HCC)   SIRS (systemic inflammatory response syndrome) (HCC)   Acute hypoxemic respiratory failure (HCC)   Polysubstance abuse ([place])     LOS: 6 days   Gina Stecher PA-C 04/10/2020, 9:02 AM

## 2020-04-10 NOTE — Progress Notes (Signed)
NAME:  Gina Hooper, MRN:  [id number], DOB:  12-Mar-1991, LOS: 6 ADMISSION DATE:  04/04/2020, CONSULTATION DATE:  8/12 REFERRING MD:  Mahala Menghini , CHIEF COMPLAINT:  Acute encephalopathy    Brief History   29 year old female with history of severe alcoholic dependence, and polysubstance abuse admitted with chief complaint of abdominal pain nausea and vomiting on 8/11 found to be in acute liver failure in the setting of alcoholic hepatitis with profound coagulopathy as well as hematemesis.  Critical care asked to evaluate on 8/12 given declining mental status and concern about IV access.  History of present illness   29 year old female with significant history of alcohol and polysubstance abuse she was admitted to the hospital on 8/11 with chief complaint of abdominal pain, nausea and vomiting. She had been taking Tylenol for severe pain. However as the pain became unbearable she called EMS. On arrival she was found to be hypoglycemic. Tachycardic, and nauseated. She was transferred to the emergency room and on admission found to have AST greater than 10,000, ALT greater than 4000. Platelet count of 40, Tylenol level less than 10. Urinary drug screen was positive for cocaine and cannabis . Alcohol level 61. He also had high anion gap metabolic acidosis with elevated lactate. Gastroenterology team was consulted, her pregnancy test was negative, hepatitis a and C were nonreactive. They felt her acute hepatitis was primarily secondary to acute alcoholic hepatitis plus/minus complicated by Tylenol ingestion. They had called transplant team at [facility], given her heavy substance and alcohol abuse history she was denied as a transplant candidate, and recommendations were to continue/start NAC infusion, initiate steroids when infection ruled out, and finally correct coagulopathy. Acetylcysteine bag was initiated while in the emergency room. Over the course of the evening from 8/11 to the morning of 8/12 she became  more encephalopathic , Has had 2 episodes of fairly large volume of emesis with frank blood.  She has become progressively incoherent receiving as needed dosing of Ativan.  Critical care was consulted because she was progressively encephalopathic with severe coagulopathy and ongoing liver failure.  There was concern about worsening airway compromise and IV access  Past Medical History  ETOH abuse, alcohol related hepatitis, anemia, prior osteo of left ankle. Substance abuse, coagulopathy   Significant Hospital Events   8/11 admitted. Discriminant fx score 304. MELD-Na 40, [name], t bili 4.8.  Hepatomegly, non-specific GB wall thickening likely reactive on CT. she was seen by GI.  [facility] hepatology/transplant: she is not a candidate for transplant.  Recommendations were to hydrate, correct coagulopathy, start acetylcysteine infusion for possible component of Tylenol toxicity on top of alcoholic hepatitis.  Recommended starting systemic steroids once systemic infection ruled out 8/16 Remains on vent, not waking  Consults:  Gastroenterology consulted 8/11 Critical care consulted 9/11    Procedures:  R IJ  8/13 >> ETT  8/13 >>  Significant Diagnostic Tests:  UDS 8/11 >> opiate / THC / cocaine positive  CT ABD 8/11 >> borderline hepatomegaly with diffuse patchy low-attenuation with concern for acute inflammation/hepatitis.  Gallbladder wall thickening with pericholecystic fluid felt reactive there were no gallstones.  There is a small right effusion.  Mild ascites in the pelvis. MRI Brain 8/14 >> no acute intracranial process. Mild pansinus disease  EEG 8/16 >> suggestive of severe diffuse encephalopathy, non-specific etiology  Micro Data:  COVID 8/11 >> neg MRSA PCR 8/11 >> neg   Antimicrobials:    Interim history/subjective:  Tmax 99.9 / WBC increased to 20  On vent - 30% fiO2, PEEP 5 I/O 400 ml UOP, +413 in last 24 hours.  Remains 2.9L+ for admit RN reports no acute events  overnight.   Objective   Blood pressure (!) 138/97, pulse (!) 124, temperature 99.9 F (37.7 C), temperature source Axillary, resp. rate 16, height 5\' 6"  (1.676 m), weight 70.3 kg, last menstrual period 03/25/2020, SpO2 100 %.    Vent Mode: PRVC FiO2 (%):  [30 %] 30 % Set Rate:  [16 bmp] 16 bmp Vt Set:  [470 mL] 470 mL PEEP:  [5 cmH20] 5 cmH20 Plateau Pressure:  [14 cmH20-16 cmH20] 14 cmH20   Intake/Output Summary (Last 24 hours) at 04/10/2020 0847 Last data filed at 04/10/2020 0300 Gross per 24 hour  Intake 1613.33 ml  Output 1200 ml  Net 413.33 ml   Filed Weights   04/04/20 0613 04/04/20 1648  Weight: 66.7 kg 70.3 kg    Examination: General: young adult female lying in bed in NAD HEENT: MM pink/moist, ETT, bloody sclera, pupils 53mm sluggishly reactive Neuro: obtunded, not on sedation, does not respond to painful stimuli, blinking / + corneal response but no purposeful movement noted.  Grandmother at bedside reports she looked toward her voice this am upon entry to the room CV: s1s2 rrr, tachy, no m/r/g PULM: non-labored on vent, lungs bilaterally clear  GI: soft, bsx4 active  Extremities: warm/dry, BUE 1+ edema  Skin: no rashes or lesions  Resolved Hospital Problem list     Assessment & Plan:   Acute Liver Failure in the setting of Acute Alcoholic Hepatitis +/- Tylenol Toxicity MELD-Na score 40 on admit, [name], T.Bili 4.8, coagulapathy. Discriminant fx score 304.  Completed NAC 8/14. Not a candidate for transplant per [facility].  -appreciate GI input  -continue solumedrol 40 mg IV Q24 -continue lactulose -supportive care  UGIB w/ Hematemesis Acute Blood Loss Anemia  Not felt to be variceal bleed.  Likely secondary to gastritis -BID PPI  -follow CBC trend  -transfuse for Hgb <7%, active bleeding   Severe Coagulopathy in the setting of Acute Liver Failure S/P FFP, Vit K  -follow INR trend  -concern with persistently elevated INR that the liver injury may  not be reversible   Lactic Acidosis  In setting of organ hypoperfusion and poor hepatic clearance  -follow lactate trend, repeat intermittently   Thrombocytopenia in setting Liver Disease -improving, follow CBC  AKI -Trend BMP / urinary output -Replace electrolytes as indicated -Avoid nephrotoxic agents, ensure adequate renal perfusion -renal dose medications  Acute Hepatic / Metabolic Encephalopathy Alcohol Withdrawal. MRI negative 8/13. EEG 8/16 with diffuse encephalopathy  -appreciate Neurology assistance with patient care -continue lactulose -minimize all sedating medications  -continue folate, thiamine, MVI -concern for overall poor prognosis   Hypokalemia Hypophosphatemia  Hypernatremia -monitor, replace as indicated -free water per tube  Moderate Protein Calorie Malnutrition  -TF per Nutrition   Best practice:  Diet: NPO Pain/Anxiety/Delirium protocol (if indicated): holding sedation  VAP protocol (if indicated): in place  DVT prophylaxis: SCD GI prophylaxis: PPI  Glucose control: NA Mobility: BR Code Status: full code  Family Communication: Grandmother updated at bedside 8/17 Disposition: ICU   CC Time: 34 minutes   9/17, MSN, NP-C [facility] 04/10/2020, 8:47 AM   Please see [email or web address] for pager details.

## 2020-04-10 NOTE — Plan of Care (Signed)

## 2020-04-11 ENCOUNTER — Inpatient Hospital Stay (HOSPITAL_COMMUNITY): Payer: Medicaid Other

## 2020-04-11 ENCOUNTER — Encounter (HOSPITAL_COMMUNITY)
Admission: EM | Disposition: A | Discharge status: Home / Self Care | Payer: Self-pay | Source: Home / Self Care | Attending: Internal Medicine

## 2020-04-11 DIAGNOSIS — J9601 Acute respiratory failure with hypoxia: Secondary | ICD-10-CM | POA: Diagnosis not present

## 2020-04-11 LAB — COMPREHENSIVE METABOLIC PANEL
ALT: 683 U/L — ABNORMAL HIGH (ref 0–44)
AST: 165 U/L — ABNORMAL HIGH (ref 15–41)
Albumin: 2.8 g/dL — ABNORMAL LOW (ref 3.5–5.0)
Alkaline Phosphatase: 157 U/L — ABNORMAL HIGH (ref 38–126)
Anion gap: 9 (ref 5–15)
BUN: 32 mg/dL — ABNORMAL HIGH (ref 6–20)
CO2: 28 mmol/L (ref 22–32)
Calcium: 8.4 mg/dL — ABNORMAL LOW (ref 8.9–10.3)
Chloride: 119 mmol/L — ABNORMAL HIGH (ref 98–111)
Creatinine, Ser: 0.66 mg/dL (ref 0.44–1.00)
GFR calc Af Amer: >60 mL/min (ref >60)
GFR calc non Af Amer: >60 mL/min (ref >60)
Glucose, Bld: 192 mg/dL — ABNORMAL HIGH (ref 70–99)
Potassium: 4 mmol/L (ref 3.5–5.1)
Sodium: 156 mmol/L — ABNORMAL HIGH (ref 135–145)
Total Bilirubin: 20.6 mg/dL (ref 0.3–1.2)
Total Protein: 6.4 g/dL — ABNORMAL LOW (ref 6.5–8.1)

## 2020-04-11 LAB — CBC
HCT: 32.2 % — ABNORMAL LOW (ref 36.0–46.0)
Hemoglobin: 10.3 g/dL — ABNORMAL LOW (ref 12.0–15.0)
MCH: 26.5 pg (ref 26.0–34.0)
MCHC: 32 g/dL (ref 30.0–36.0)
MCV: 83 fL (ref 80.0–100.0)
Platelets: 158 10*3/uL (ref 150–400)
RBC: 3.88 MIL/uL (ref 3.87–5.11)
RDW: 21.1 % — ABNORMAL HIGH (ref 11.5–15.5)
WBC: 25.8 10*3/uL — ABNORMAL HIGH (ref 4.0–10.5)
nRBC: 2.1 % — ABNORMAL HIGH (ref 0.0–0.2)

## 2020-04-11 LAB — AMMONIA: Ammonia: 60 umol/L — ABNORMAL HIGH (ref 9–35)

## 2020-04-11 LAB — GLUCOSE, CAPILLARY
Glucose-Capillary: 188 mg/dL — ABNORMAL HIGH (ref 70–99)
Glucose-Capillary: 155 mg/dL — ABNORMAL HIGH (ref 70–99)
Glucose-Capillary: 197 mg/dL — ABNORMAL HIGH (ref 70–99)
Glucose-Capillary: 260 mg/dL — ABNORMAL HIGH (ref 70–99)
Glucose-Capillary: 237 mg/dL — ABNORMAL HIGH (ref 70–99)

## 2020-04-11 LAB — HEMOGLOBIN A1C
Hgb A1c MFr Bld: 5.9 % — ABNORMAL HIGH (ref 4.8–5.6)
Mean Plasma Glucose: 122.63 mg/dL

## 2020-04-11 LAB — PROTIME-INR
INR: 2.5 — ABNORMAL HIGH (ref 0.8–1.2)
Prothrombin Time: 25.8 seconds — ABNORMAL HIGH (ref 11.4–15.2)

## 2020-04-11 SURGERY — EGD (ESOPHAGOGASTRODUODENOSCOPY)
Anesthesia: Monitor Anesthesia Care

## 2020-04-11 MED ORDER — LACTULOSE 10 GM/15ML PO SOLN
30.0000 g | Freq: Three times a day (TID) | ORAL | Status: DC
  Administered 2020-04-11 – 2020-04-14 (×12): 30 g
  Filled 2020-04-11 (×12): qty 45

## 2020-04-11 MED ORDER — INSULIN ASPART 100 UNIT/ML ~~LOC~~ SOLN
0.0000 [IU] | SUBCUTANEOUS | Status: DC
  Administered 2020-04-11: 3 [IU] via SUBCUTANEOUS
  Administered 2020-04-11 (×2): 1 [IU] via SUBCUTANEOUS
  Administered 2020-04-12 (×6): 2 [IU] via SUBCUTANEOUS
  Administered 2020-04-13: 1 [IU] via SUBCUTANEOUS
  Administered 2020-04-13: 3 [IU] via SUBCUTANEOUS
  Administered 2020-04-13 – 2020-04-23 (×6): 1 [IU] via SUBCUTANEOUS

## 2020-04-11 MED ORDER — FREE WATER
300.0000 mL | Status: DC
  Administered 2020-04-11 – 2020-04-12 (×6): 300 mL

## 2020-04-11 MED ORDER — DEXTROSE 5 % IV SOLN: INTRAVENOUS | Status: AC

## 2020-04-11 MED ORDER — LACTATED RINGERS IV BOLUS
500.0000 mL | Freq: Once | INTRAVENOUS | Status: AC
  Administered 2020-04-11: 500 mL via INTRAVENOUS

## 2020-04-11 MED ORDER — METOPROLOL TARTRATE 5 MG/5ML IV SOLN
5.0000 mg | INTRAVENOUS | Status: DC | PRN
  Administered 2020-04-12 – 2020-04-14 (×9): 5 mg via INTRAVENOUS
  Filled 2020-04-11 (×9): qty 5

## 2020-04-11 NOTE — Care Plan (Signed)
CRITICAL VALUE ALERT  Critical Value:  Bilirubin 20.6  Date & Time Notied:  04/11/20 6770  Provider Notified: Pola Corn MD paged  Orders Received/Actions taken: Waiting for response

## 2020-04-11 NOTE — Progress Notes (Addendum)
NAME:  MARCAYLA BUDGE, MRN:  [id number], DOB:  Mar 02, 1991, LOS: 7 ADMISSION DATE:  04/04/2020, CONSULTATION DATE:  8/12 REFERRING MD:  Mahala Menghini , CHIEF COMPLAINT:  Acute encephalopathy    Brief History   29 year old female with history of severe alcoholic dependence, and polysubstance abuse admitted with chief complaint of abdominal pain nausea and vomiting on 8/11 found to be in acute liver failure in the setting of alcoholic hepatitis with profound coagulopathy as well as hematemesis (two episodes of large volume emesis with frank blood early in admit).  Suspected component of tylenol abuse as well, s/p NAC infusion. UDS positive for cocaine + cannabis.  ETOH positive on admit.  Hep A & C non-reactive. She has been declined as a transplant candidate by [facility].  Critical care asked to evaluate on 8/12 given declining mental status and concern about IV access.    Past Medical History  ETOH abuse, alcohol related hepatitis, anemia, prior osteo of left ankle. Substance abuse, coagulopathy   Significant Hospital Events   8/11 admitted. Discriminant fx score 304. MELD-Na 40, [name], t bili 4.8.  Hepatomegly, non-specific GB wall thickening likely reactive on CT. she was seen by GI.  [facility] hepatology/transplant: she is not a candidate for transplant.  Recommendations were to hydrate, correct coagulopathy, start acetylcysteine infusion for possible component of Tylenol toxicity on top of alcoholic hepatitis.  Recommended starting systemic steroids once systemic infection ruled out 8/16 Remains on vent, not waking  Consults:  Gastroenterology consulted 8/11 Critical care consulted 9/11    Procedures:  R IJ  8/13 >> ETT  8/13 >>  Significant Diagnostic Tests:  UDS 8/11 >> opiate / THC / cocaine positive  CT ABD 8/11 >> borderline hepatomegaly with diffuse patchy low-attenuation with concern for acute inflammation/hepatitis.  Gallbladder wall thickening with pericholecystic fluid felt  reactive there were no gallstones.  There is a small right effusion.  Mild ascites in the pelvis. MRI Brain 8/14 >> no acute intracranial process. Mild pansinus disease  EEG 8/16 >> suggestive of severe diffuse encephalopathy, non-specific etiology  Micro Data:  COVID 8/11 >> neg MRSA PCR 8/11 >> neg  BCx2 8/18 >>   Antimicrobials:    Interim history/subjective:  Tmax 102 in last 24 hours / WBC increased to 25.8 On vent - 30%, PEEP 5 I/O 700 ml UOP, -550 in last 24 hours CVP 4 RN reports pt vomited this am, gagging on ETT, tachycardic   Objective   Blood pressure (!) 160/125, pulse (!) 138, temperature (!) 101.1 F (38.4 C), temperature source Axillary, resp. rate 19, height 5\' 6"  (1.676 m), weight 70.3 kg, last menstrual period 03/25/2020, SpO2 100 %. CVP:  [4 mmHg-9 mmHg] 4 mmHg  Vent Mode: PRVC FiO2 (%):  [30 %] 30 % Set Rate:  [16 bmp] 16 bmp Vt Set:  [470 mL] 470 mL PEEP:  [5 cmH20] 5 cmH20 Pressure Support:  [8 cmH20] 8 cmH20 Plateau Pressure:  [14 cmH20-15 cmH20] 14 cmH20   Intake/Output Summary (Last 24 hours) at 04/11/2020 0833 Last data filed at 04/11/2020 0600 Gross per 24 hour  Intake 1393.83 ml  Output 2000 ml  Net -606.17 ml   Filed Weights   04/04/20 0613 04/04/20 1648  Weight: 66.7 kg 70.3 kg    Examination: General: adult female lying in bed in NAD on vent  HEENT: MM pink/moist, ETT, bloody sclera, pupils =/slugglishly reactive Neuro: obtunded, spontaneous movement noted intermittently  CV: s1s2 rrr tachy, no m/r/g PULM: non-labored on  vent, lungs bilaterally clear GI: soft, bsx4 active  Extremities: warm/dry, trace peripheral dependent edema  Skin: no rashes or lesions  CXR 8/18 >> images personally reviewed, no acute infiltrate / active disease process  Resolved Hospital Problem list     Assessment & Plan:   Acute Liver Failure in the setting of Acute Alcoholic Hepatitis +/- Tylenol Toxicity MELD-Na score 40 on admit, [name],  T.Bili 4.8, coagulapathy. Discriminant fx score 304.  Completed NAC 8/14. Not a candidate for transplant per [facility].  -appreciate GI input  -continue solumedrol 40 mg IV Q24 -supportive care  -continue lactulose   UGIB w/ Hematemesis Acute Blood Loss Anemia  Not felt to be variceal bleed.  Likely secondary to gastritis -BID PPI  -follow CBC trend  -transfuse for Hgb <7%, active bleeding    Severe Coagulopathy in the setting of Acute Liver Failure S/P FFP, Vit K  -follow coags, INR -worrisome that persistently elevated INR / rising t.bili that liver injury may not be reversible  Acute Respiratory Failure secondary to Acute Liver Failure, Hepatic Encephalopathy  -PRVC 8cc/kg -wean PEEP / FiO2 for sats >90% -follow intermittent CXR  -advance ETT 1 cm   Tachycardia  Fever  Suspect reactive but may be component of volume depletion  -500 ml LR bolus x1  -PRN lopressor  -assess blood cultures (no foley, clear CXR)  Lactic Acidosis  In setting of organ hypoperfusion and poor hepatic clearance  -follow intermittently, poor clearance   Thrombocytopenia in setting Liver Disease -improving, follow CBC   AKI -Trend BMP / urinary output -Replace electrolytes as indicated -Avoid nephrotoxic agents, ensure adequate renal perfusion  Acute Hepatic / Metabolic Encephalopathy Alcohol Withdrawal. MRI negative 8/13. EEG 8/16 with diffuse encephalopathy  -appreciate Neurology input  -continue lactulose  -minimize sedating medications  -continue folate, thiamine, MVI -concern for overall poor prognosis  Hypokalemia Hypophosphatemia  Hypernatremia -continue free water -add D5w at 50 ml/hr for 48 hours  -monitor serum Na   Hyperglycemia  -add SSI, very sensitive scale with liver dysfunction  Moderate Protein Calorie Malnutrition  -TF per Nutrition   Best practice:  Diet: NPO Pain/Anxiety/Delirium protocol (if indicated): holding sedation  VAP protocol (if indicated): in place    DVT prophylaxis: SCD GI prophylaxis: PPI  Glucose control: NA Mobility: BR Code Status: full code  Family Communication: Grandmother updated at bedside 8/18 Disposition: ICU   CC Time: 33 minutes   Canary Brim, MSN, NP-C [facility] 04/11/2020, 8:33 AM   Please see [email or web address] for pager details.

## 2020-04-11 NOTE — Progress Notes (Addendum)
eLink Physician-Brief Progress Note [name] DOB: 27-Oct-1990 MRN: [id number]   Date of Service  04/11/2020  HPI/Events of Note  Patient with persistent sinus tachycardia with rate 125-128, BP 141/104 with MAP 115, saturation 100 %.  eICU Interventions  Metoprolol changed to Q 4 hours PRN heart rate > 115, 2-D echo ordered to check LV function given history of heavy ETOH abuse with evidence of other organ damage.        Thomasene Lot Danuta Huseman 04/11/2020, 10:31 PM

## 2020-04-12 ENCOUNTER — Inpatient Hospital Stay (HOSPITAL_COMMUNITY): Payer: Medicaid Other

## 2020-04-12 ENCOUNTER — Inpatient Hospital Stay: Payer: Self-pay

## 2020-04-12 DIAGNOSIS — D689 Coagulation defect, unspecified: Secondary | ICD-10-CM | POA: Diagnosis not present

## 2020-04-12 DIAGNOSIS — I428 Other cardiomyopathies: Secondary | ICD-10-CM

## 2020-04-12 DIAGNOSIS — J9601 Acute respiratory failure with hypoxia: Secondary | ICD-10-CM | POA: Diagnosis not present

## 2020-04-12 DIAGNOSIS — M7989 Other specified soft tissue disorders: Secondary | ICD-10-CM

## 2020-04-12 DIAGNOSIS — K7201 Acute and subacute hepatic failure with coma: Secondary | ICD-10-CM | POA: Diagnosis not present

## 2020-04-12 LAB — COMPREHENSIVE METABOLIC PANEL
ALT: 547 U/L — ABNORMAL HIGH (ref 0–44)
AST: 181 U/L — ABNORMAL HIGH (ref 15–41)
Albumin: 2.5 g/dL — ABNORMAL LOW (ref 3.5–5.0)
Alkaline Phosphatase: 135 U/L — ABNORMAL HIGH (ref 38–126)
Anion gap: 6 (ref 5–15)
BUN: 29 mg/dL — ABNORMAL HIGH (ref 6–20)
CO2: 27 mmol/L (ref 22–32)
Calcium: 7.8 mg/dL — ABNORMAL LOW (ref 8.9–10.3)
Chloride: 117 mmol/L — ABNORMAL HIGH (ref 98–111)
Creatinine, Ser: 0.54 mg/dL (ref 0.44–1.00)
GFR calc Af Amer: >60 mL/min (ref >60)
GFR calc non Af Amer: >60 mL/min (ref >60)
Glucose, Bld: 228 mg/dL — ABNORMAL HIGH (ref 70–99)
Potassium: 3.8 mmol/L (ref 3.5–5.1)
Sodium: 150 mmol/L — ABNORMAL HIGH (ref 135–145)
Total Bilirubin: 20.9 mg/dL (ref 0.3–1.2)
Total Protein: 5.9 g/dL — ABNORMAL LOW (ref 6.5–8.1)

## 2020-04-12 LAB — ECHOCARDIOGRAM COMPLETE
Area-P 1/2: 3.42 cm2
Height: 66 in
S' Lateral: 2.5 cm
Weight: 2462.1 oz

## 2020-04-12 LAB — GLUCOSE, CAPILLARY
Glucose-Capillary: 203 mg/dL — ABNORMAL HIGH (ref 70–99)
Glucose-Capillary: 209 mg/dL — ABNORMAL HIGH (ref 70–99)
Glucose-Capillary: 235 mg/dL — ABNORMAL HIGH (ref 70–99)
Glucose-Capillary: 222 mg/dL — ABNORMAL HIGH (ref 70–99)
Glucose-Capillary: 222 mg/dL — ABNORMAL HIGH (ref 70–99)
Glucose-Capillary: 269 mg/dL — ABNORMAL HIGH (ref 70–99)

## 2020-04-12 LAB — CBC
HCT: 31.6 % — ABNORMAL LOW (ref 36.0–46.0)
Hemoglobin: 10.2 g/dL — ABNORMAL LOW (ref 12.0–15.0)
MCH: 26.8 pg (ref 26.0–34.0)
MCHC: 32.3 g/dL (ref 30.0–36.0)
MCV: 83.2 fL (ref 80.0–100.0)
Platelets: 119 10*3/uL — ABNORMAL LOW (ref 150–400)
RBC: 3.8 MIL/uL — ABNORMAL LOW (ref 3.87–5.11)
RDW: 22.3 % — ABNORMAL HIGH (ref 11.5–15.5)
WBC: 28.4 10*3/uL — ABNORMAL HIGH (ref 4.0–10.5)
nRBC: 4 % — ABNORMAL HIGH (ref 0.0–0.2)

## 2020-04-12 LAB — AMMONIA: Ammonia: 91 umol/L — ABNORMAL HIGH (ref 9–35)

## 2020-04-12 LAB — PROTIME-INR
INR: 2.2 — ABNORMAL HIGH (ref 0.8–1.2)
Prothrombin Time: 23.4 seconds — ABNORMAL HIGH (ref 11.4–15.2)

## 2020-04-12 MED ORDER — RIFAXIMIN 550 MG PO TABS
550.0000 mg | ORAL_TABLET | Freq: Two times a day (BID) | ORAL | Status: DC
  Administered 2020-04-12 – 2020-04-14 (×6): 550 mg
  Filled 2020-04-12 (×7): qty 1

## 2020-04-12 MED ORDER — SODIUM CHLORIDE 0.9% FLUSH
10.0000 mL | INTRAVENOUS | Status: DC | PRN
  Administered 2020-04-25: 10 mL

## 2020-04-12 MED ORDER — SODIUM CHLORIDE 0.9% FLUSH
10.0000 mL | Freq: Two times a day (BID) | INTRAVENOUS | Status: DC
  Administered 2020-04-12 – 2020-04-13 (×4): 10 mL
  Administered 2020-04-14 – 2020-04-15 (×2): 40 mL
  Administered 2020-04-16 – 2020-04-30 (×15): 10 mL

## 2020-04-12 MED ORDER — PERFLUTREN LIPID MICROSPHERE
1.0000 mL | INTRAVENOUS | Status: DC | PRN
  Administered 2020-04-12: 4 mL via INTRAVENOUS
  Filled 2020-04-12: qty 10

## 2020-04-12 MED ORDER — FREE WATER
300.0000 mL | Status: DC
  Administered 2020-04-12 – 2020-04-13 (×8): 300 mL

## 2020-04-12 MED ORDER — SODIUM CHLORIDE 0.9 % IV SOLN
2.0000 g | INTRAVENOUS | Status: DC
  Administered 2020-04-12 – 2020-04-22 (×11): 2 g via INTRAVENOUS
  Filled 2020-04-12: qty 2
  Filled 2020-04-12: qty 20
  Filled 2020-04-12: qty 2
  Filled 2020-04-12: qty 20
  Filled 2020-04-12 (×2): qty 2
  Filled 2020-04-12 (×3): qty 20
  Filled 2020-04-12: qty 2
  Filled 2020-04-12 (×2): qty 20

## 2020-04-12 MED ORDER — IBUPROFEN 400 MG PO TABS
400.0000 mg | ORAL_TABLET | Freq: Once | ORAL | Status: AC
  Administered 2020-04-12: 400 mg via ORAL
  Filled 2020-04-12: qty 1
  Filled 2020-04-12: qty 2

## 2020-04-12 NOTE — Progress Notes (Signed)
While turning to side, pt had a large emesis occurrence. Pt was suctioned by this RN and was given PRN zofran. CCM notified by this RN; orders received to obtain KUB and hold tube feeds for now. This RN will continue to monitor.

## 2020-04-12 NOTE — TOC Progression Note (Signed)
Transition of Care [facility]) - Progression Note    Patient Details  Name: Gina Hooper MRN: [id number] Date of Birth: [birth year]  Transition of [facility]) CM/SW Contact  Golda Acre, RN Phone Number: 04/12/2020, 8:19 AM  Clinical Narrative:    Remains on the vent at 40% fi02, temp of 101.7-cooling blanket placed, na 150,total bili-20.0 ammonia 91 wbc 28.4 following for progression and toc needs.   Expected Discharge Plan: Home/Self Care Barriers to Discharge: Continued Medical Work up  Expected Discharge Plan and Services Expected Discharge Plan: Home/Self Care In-house Referral: Financial Counselor Discharge Planning Services: CM Consult   Living arrangements for the past 2 months: Single Family Home                                       Social Determinants of Health (SDOH) Interventions    Readmission Risk Interventions No flowsheet data found.

## 2020-04-12 NOTE — Progress Notes (Signed)
Bilateral lower extremity venous duplex has been completed. Preliminary results can be found in CV Proc through chart review.  Results were given to Canary Brim NP.  04/12/20 2:36 PM Olen Cordial RVT

## 2020-04-12 NOTE — Progress Notes (Signed)
[facility] Progress Note  CC:  Acute hepatitis, polysubstance abuse, hepatic failure   Subjective: She is unresponsive, remains on the vent. No family at the bedside.   Objective:  Vital signs in last 24 hours: Temp:  [99.7 F (37.6 C)-101.7 F (38.7 C)] 101.2 F (38.4 C) (08/19 0800) Pulse Rate:  [111-140] 122 (08/19 1000) Resp:  [16-23] 21 (08/19 1000) BP: (127-151)/(89-112) 147/99 (08/19 1000) SpO2:  [99 %-100 %] 100 % (08/19 1000) FiO2 (%):  [30 %] 30 % (08/19 0912) Weight:  [69.8 kg] 69.8 kg (08/19 0500) Last BM Date: 04/12/20   General: Critically ill female, unresponsive on the vent.  Eyes: Significant scleral icterus with subconjunctival hemorrhage bilaterally.  Heart: Tachycardic. No murmur.  Pulm:  Breath sounds coarse but clear throughout, diminished in the bases.  Abdomen: Distended, no obvious ascites or HSM. No mass. + Bx x 4 quads.  Rectal: Flexi seal draining light golden liquid stool.  Extremities:  2+ generalized edema upper and lower extremities.  Neurologic:  Unresponsive to deep stimuli. No spontaneous movements assessed. RN today reported patient moves head from side to side.   Intake/Output from previous day: 08/18 0701 - 08/19 0700 In: 3065.8 [I.V.:865.8; NG/GT:1640; IV Piggyback:520] Out: 2100 [Urine:1400; Stool:700] Intake/Output this shift: Total I/O In: 159.8 [I.V.:159.8] Out: 50 [Stool:50]  Lab Results: Recent Labs    04/10/20 0525 04/11/20 0500 04/12/20 0500  WBC 20.0* 25.8* 28.4*  HGB 9.3* 10.3* 10.2*  HCT 28.1* 32.2* 31.6*  PLT 155 158 119*   BMET Recent Labs    04/10/20 0525 04/11/20 0500 04/12/20 0500  NA 152* 156* 150*  K 3.7 4.0 3.8  CL 119* 119* 117*  CO2 $Re'27 28 27  'cRQ$ GLUCOSE 180* 192* 228*  BUN 28* 32* 29*  CREATININE 0.74 0.66 0.54  CALCIUM 8.5* 8.4* 7.8*   LFT Recent Labs    04/12/20 0500  PROT 5.9*  ALBUMIN 2.5*  AST 181*  ALT 547*  ALKPHOS 135*  BILITOT 20.9*   PT/INR Recent Labs      04/11/20 0500 04/12/20 0500  LABPROT 25.8* 23.4*  INR 2.5* 2.2*   Hepatitis Panel No results for input(s): HEPBSAG, HCVAB, HEPAIGM, HEPBIGM in the last 72 hours.  DG CHEST PORT 1 VIEW  Result Date: 04/11/2020 CLINICAL DATA:  Hypoxia EXAM: PORTABLE CHEST 1 VIEW COMPARISON:  April 07, 2020 FINDINGS: Endotracheal tube tip is 4.0 cm above the carina. Nasogastric tube tip and side port are below the diaphragm. Central catheter tip is in the superior vena cava. No pneumothorax. There is no edema or airspace opacity. Heart size and pulmonary vascularity are normal. No adenopathy. No bone lesions. IMPRESSION: Tube and catheter positions as described without pneumothorax. Lungs clear. Cardiac silhouette within normal limits. Electronically Signed   By: Lowella Grip III M.D.   On: 04/11/2020 08:12   [place] EKG SITE RITE  Result Date: 04/12/2020 If Site Rite image not attached, placement could not be confirmed due to current cardiac rhythm.   Assessment / Plan:  57. 29 year old African-American female admitted 04/04/2020 after presenting with abdominal pain nausea and vomiting, ill for a few days prior to presentation.  Patient with history of polysubstance abuse, EtOH/cocaine/THC and opioids. Patient found to be in acute hepatic failure-unclear if this is all secondary to acute alcohol induced hepatitis plus minus component of Tylenol toxicity and component of shock liver/ischemia in setting of cocaine use. Completed NAC on 8/14. She is not a transplant candidate due to polysubstance abuse  per [facility] hepatology/transplant team. She was started on IV Solumedrol $RemoveBefor'40mg'[id number]$  Q 24 hrs on 8/12. Today Alk phos 135. AST 181. ALT 547. T. Bili 20.9. MDF 67.4. L[place] Score day # 7 (today) is 0.749 which indicates a poor prognosis.  -Stop Solumedrol  -Monitory hepatic panel and PT/INR daily -Continue tube feedings -Repeat hepatic panel in am  2. Metabolic/hepatic encephalopathy. EEG-suggestive of severe diffuse  encephalopathy nonspecific etiology. MRI Brain 8/14 without evidence of an acute intracranial process. Mild pansinus disease. Neuro following. Ammonia 91. -Continue Rifaximin and Lactulose  3. Coagulopathy. Received FFP and Vitamin K. Today INR 2.2.  4. Hematemesis at time of admission without recurrence. No plans for EGD at this time.  -Continue  PPI bid  5. Anemia. Hg 10.2. HCT 31.6. MCV 83.2.   6. Thrombocytopenia. PLT 119.   7. Acute respiratory failure/SIRS  8.  Fever. Temp 101.2. Tachycardic 8 am. WBC 28.4 (on Solumedrol) -Management per CCM  Further recommendations per Dr. Hilarie Fredrickson   Active Problems:   Hepatitis   Acute liver failure   Alcohol abuse   Thrombocytopenia (HCC)   Coagulopathy (HCC)   SIRS (systemic inflammatory response syndrome) (HCC)   Acute respiratory failure with hypoxia (HCC)   Polysubstance abuse ([employer])     LOS: 8 days   Gina Hooper  04/12/2020, 11:13 AM

## 2020-04-12 NOTE — Progress Notes (Signed)
eLink Physician-Brief Progress Note [name] DOB: 05/12/1991 MRN: [id number]   Date of Service  04/12/2020  HPI/Events of Note  Patient with fever. She cannot have Tylenol or Ibuprofen.  eICU Interventions  Cooling blanket ordered.        Thomasene Lot Chelsa Stout 04/12/2020, 12:47 AM

## 2020-04-12 NOTE — Progress Notes (Signed)
NAME:  Gina Hooper, MRN:  [id number], DOB:  Jan 19, 1991, LOS: 8 ADMISSION DATE:  04/04/2020, CONSULTATION DATE:  8/12 REFERRING MD:  Mahala Menghini , CHIEF COMPLAINT:  Acute encephalopathy    Brief History   29 year old female with history of severe alcoholic dependence, and polysubstance abuse admitted with chief complaint of abdominal pain nausea and vomiting on 8/11 found to be in acute liver failure in the setting of alcoholic hepatitis with profound coagulopathy as well as hematemesis (two episodes of large volume emesis with frank blood early in admit).  Suspected component of tylenol abuse as well, s/p NAC infusion. UDS positive for cocaine + cannabis.  ETOH positive on admit.  Hep A & C non-reactive. She has been declined as a transplant candidate by [facility]  Critical care asked to evaluate on 8/12 given declining mental status and concern about IV access.    Past Medical History  ETOH abuse, alcohol related hepatitis, anemia, prior osteo of left ankle. Substance abuse, coagulopathy   Significant Hospital Events   8/11 admitted. Discriminant fx score 304. MELD-Na 40, [name], t bili 4.8.  Hepatomegly, non-specific GB wall thickening likely reactive on CT. she was seen by GI.  [facility] hepatology/transplant: she is not a candidate for transplant.  Recommendations were to hydrate, correct coagulopathy, start acetylcysteine infusion for possible component of Tylenol toxicity on top of alcoholic hepatitis.  Recommended starting systemic steroids once systemic infection ruled out 8/16 Remains on vent, not waking  Consults:  Gastroenterology consulted 8/11 Critical care consulted 9/11    Procedures:  R IJ  8/13 >> ETT  8/13 >>   Significant Diagnostic Tests:  UDS 8/11 >> opiate / THC / cocaine positive  CT ABD 8/11 >> borderline hepatomegaly with diffuse patchy low-attenuation with concern for acute inflammation/hepatitis.  Gallbladder wall thickening with pericholecystic fluid felt  reactive there were no gallstones.  There is a small right effusion.  Mild ascites in the pelvis. MRI Brain 8/14 >> no acute intracranial process. Mild pansinus disease  EEG 8/16 >> suggestive of severe diffuse encephalopathy, non-specific etiology ECHO 8/19 >>  Micro Data:  COVID 8/11 >> neg MRSA PCR 8/11 >> neg  BCx2 8/18 >>  UA 8/19 >>  Antimicrobials:    Interim history/subjective:  Tmax 101.7 in last 24 hours, WBC 28.4 Remains on vent, 30% I/O 1.4L UOP, 700 ml stool, +965 ml in 24 ECHO ordered & lopressor adjusted per [facility] for tachycardia   Objective   Blood pressure (!) 141/97, pulse (!) 112, temperature (!) 100.4 F (38 C), temperature source Axillary, resp. rate 17, height 5\' 6"  (1.676 m), weight 69.8 kg, last menstrual period 03/25/2020, SpO2 100 %. CVP:  [2 mmHg-3 mmHg] 2 mmHg  Vent Mode: PRVC FiO2 (%):  [30 %] 30 % Set Rate:  [16 bmp] 16 bmp Vt Set:  [470 mL] 470 mL PEEP:  [5 cmH20] 5 cmH20 Pressure Support:  [8 cmH20] 8 cmH20 Plateau Pressure:  [14 cmH20-16 cmH20] 16 cmH20   Intake/Output Summary (Last 24 hours) at 04/12/2020 0850 Last data filed at 04/12/2020 0617 Gross per 24 hour  Intake 3065.76 ml  Output 2100 ml  Net 965.76 ml   Filed Weights   04/04/20 1648 04/11/20 0500 04/12/20 0500  Weight: 70.3 kg 68.2 kg 69.8 kg    Examination: General: young adult female, critically ill appearing, lying in bed on vent HEENT: MM pink/moist, ETT, bloody sclera Neuro: obtunded, +corneal, spontaneous cough, no follow commands CV: s1s2 RRR, tachy, no m/r/g  PULM: non-labored on vent, lungs bilaterally clear  GI: soft, bsx4 active  Extremities: warm/dry, generalized 1+ edema  Skin: no rashes or lesions    Abd [place] assessment 8/19:  Bowel loops noted above, no significant peritoneal fluid collection noted   Resolved Hospital Problem list   Lactic Acidosis   Assessment & Plan:   Acute Liver Failure in the setting of Acute Alcoholic Hepatitis +/- Tylenol  Toxicity MELD-Na score 40 on admit, [name], T.Bili 4.8, coagulapathy. Discriminant fx score 304.  Completed NAC 8/14. Not a candidate for transplant per [facility].  -appreciate GI input  -solumedrol 40 mg IV Q24, defer taper / stop to GI  -continue lactulose  -begin rifaximin PT  -follow ammonia   UGIB w/ Hematemesis Acute Blood Loss Anemia  Not felt to be variceal bleed.  Likely secondary to gastritis -BID PPI  -follow CBC -transfuse for Hgb <7%  Severe Coagulopathy in the setting of Acute Liver Failure Thrombocytopenia in setting Liver Disease S/P FFP, Vit K  -follow coag's, CBC -concern for grim prognosis   Acute Respiratory Failure secondary to Acute Liver Failure, Hepatic Encephalopathy  -PRVC 8cc/kg  -wean PEEP / FiO2 for sats >90% -follow intermittent CXR   Tachycardia  Fever  Suspect reactive but may be component of volume depletion  -PRN lopressor  -follow cultures -discontinue central line once midline placed  -no clear source of infection - CXR clear, no foley, no peritoneal fluid.  Will remove central line. -assess for DVT -empiric rocephin for SBP / urine coverage  -assess UA   AKI -Trend BMP / urinary output -Replace electrolytes as indicated -Avoid nephrotoxic agents, ensure adequate renal perfusion  Acute Hepatic / Metabolic Encephalopathy Alcohol Withdrawal. MRI negative 8/13. EEG 8/16 with diffuse encephalopathy  -appreciate Neurology input  -continue lactulose  -add rifaximin  -continue folate, MVI, thiamine  -concern for poor prognosis   Hypokalemia Hypophosphatemia  Hypernatremia -increase free water to 300 ml Q2 -continue D5w for now  -follow serum Na  Hyperglycemia  -SSI, very sensitive scale with liver dysfunction   Moderate Protein Calorie Malnutrition  -TF per Nutrition   Best practice:  Diet: NPO Pain/Anxiety/Delirium protocol (if indicated): holding sedation  VAP protocol (if indicated): in place  DVT prophylaxis:  SCD GI prophylaxis: PPI  Glucose control: NA Mobility: BR Code Status: full code  Family Communication: Will update family 8/19 on arrival  Disposition: ICU   CC Time: 35 minutes   Canary Brim, MSN, NP-C [facility] 04/12/2020, 8:50 AM   Please see [email or web address] for pager details.

## 2020-04-12 NOTE — Progress Notes (Signed)
  Echocardiogram 2D Echocardiogram has been performed.  Gina Hooper 04/12/2020, 9:00 AM

## 2020-04-13 ENCOUNTER — Inpatient Hospital Stay (HOSPITAL_COMMUNITY): Payer: Medicaid Other

## 2020-04-13 DIAGNOSIS — K7201 Acute and subacute hepatic failure with coma: Secondary | ICD-10-CM | POA: Diagnosis not present

## 2020-04-13 LAB — CBC
HCT: 30.1 % — ABNORMAL LOW (ref 36.0–46.0)
Hemoglobin: 10 g/dL — ABNORMAL LOW (ref 12.0–15.0)
MCH: 27.2 pg (ref 26.0–34.0)
MCHC: 33.2 g/dL (ref 30.0–36.0)
MCV: 82 fL (ref 80.0–100.0)
Platelets: 87 10*3/uL — ABNORMAL LOW (ref 150–400)
RBC: 3.67 MIL/uL — ABNORMAL LOW (ref 3.87–5.11)
RDW: 22.4 % — ABNORMAL HIGH (ref 11.5–15.5)
WBC: 25.6 10*3/uL — ABNORMAL HIGH (ref 4.0–10.5)
nRBC: 3.4 % — ABNORMAL HIGH (ref 0.0–0.2)

## 2020-04-13 LAB — URINALYSIS, ROUTINE W REFLEX MICROSCOPIC
Glucose, UA: 50 mg/dL — AB
Ketones, ur: NEGATIVE mg/dL
Leukocytes,Ua: NEGATIVE
Nitrite: NEGATIVE
Protein, ur: NEGATIVE mg/dL
Specific Gravity, Urine: 1.026 (ref 1.005–1.030)
pH: 6 (ref 5.0–8.0)

## 2020-04-13 LAB — COMPREHENSIVE METABOLIC PANEL
ALT: 433 U/L — ABNORMAL HIGH (ref 0–44)
AST: 189 U/L — ABNORMAL HIGH (ref 15–41)
Albumin: 2.3 g/dL — ABNORMAL LOW (ref 3.5–5.0)
Alkaline Phosphatase: 123 U/L (ref 38–126)
Anion gap: 9 (ref 5–15)
BUN: 37 mg/dL — ABNORMAL HIGH (ref 6–20)
CO2: 26 mmol/L (ref 22–32)
Calcium: 7.8 mg/dL — ABNORMAL LOW (ref 8.9–10.3)
Chloride: 111 mmol/L (ref 98–111)
Creatinine, Ser: 0.57 mg/dL (ref 0.44–1.00)
GFR calc Af Amer: >60 mL/min (ref >60)
GFR calc non Af Amer: >60 mL/min (ref >60)
Glucose, Bld: 180 mg/dL — ABNORMAL HIGH (ref 70–99)
Potassium: 4.1 mmol/L (ref 3.5–5.1)
Sodium: 146 mmol/L — ABNORMAL HIGH (ref 135–145)
Total Bilirubin: 21.3 mg/dL (ref 0.3–1.2)
Total Protein: 5.4 g/dL — ABNORMAL LOW (ref 6.5–8.1)

## 2020-04-13 LAB — GLUCOSE, CAPILLARY
Glucose-Capillary: 123 mg/dL — ABNORMAL HIGH (ref 70–99)
Glucose-Capillary: 139 mg/dL — ABNORMAL HIGH (ref 70–99)
Glucose-Capillary: 144 mg/dL — ABNORMAL HIGH (ref 70–99)
Glucose-Capillary: 158 mg/dL — ABNORMAL HIGH (ref 70–99)
Glucose-Capillary: 166 mg/dL — ABNORMAL HIGH (ref 70–99)

## 2020-04-13 LAB — PROTIME-INR
INR: 1.8 — ABNORMAL HIGH (ref 0.8–1.2)
Prothrombin Time: 20.5 seconds — ABNORMAL HIGH (ref 11.4–15.2)

## 2020-04-13 LAB — PROCALCITONIN: Procalcitonin: 1.97 ng/mL

## 2020-04-13 LAB — AMMONIA: Ammonia: 33 umol/L (ref 9–35)

## 2020-04-13 MED ORDER — FREE WATER
300.0000 mL | Status: DC
  Administered 2020-04-13 – 2020-04-15 (×12): 300 mL

## 2020-04-13 MED ORDER — METOCLOPRAMIDE HCL 5 MG/ML IJ SOLN: 5.0000 mg | Freq: Three times a day (TID) | INTRAMUSCULAR | Status: DC

## 2020-04-13 MED ORDER — METOCLOPRAMIDE HCL 5 MG/ML IJ SOLN
5.0000 mg | Freq: Three times a day (TID) | INTRAMUSCULAR | Status: DC
  Administered 2020-04-13 – 2020-04-14 (×3): 5 mg via INTRAVENOUS
  Filled 2020-04-13 (×3): qty 2

## 2020-04-13 NOTE — Progress Notes (Signed)
eLink Physician-Brief Progress Note [name] DOB: 26-Sep-1990 MRN: [id number]   Date of Service  04/13/2020  HPI/Events of Note  Nausea and vomiting of enteral feeds.  eICU Interventions  NG tube to LIS, stat CXR and KUB, PRN Zofran        Thadius Smisek U Deane Wattenbarger 04/13/2020, 1:09 AM

## 2020-04-13 NOTE — Progress Notes (Signed)
eLink Physician-Brief Progress Note [name] DOB: 1990/12/09 MRN: [id number]   Date of Service  04/13/2020  HPI/Events of Note  Nursing request for Foley catheter.   eICU Interventions  Will place Foley catheter.      Intervention Category Major Interventions: Other:  Gina Hooper 04/13/2020, 10:12 PM

## 2020-04-13 NOTE — Progress Notes (Signed)
During bath patient started gagging on tube and vomited a thin brown substance that had  a few blood clots in it. When patient was rolled to other side to finish linen change, she had another large round of emesis. Patient appears to be vomiting due to gag reflex and not nausea. Ativan given and stat x-rays being taken. RN will continue to monitor patient closely.

## 2020-04-13 NOTE — Progress Notes (Signed)
Pt had not voided since this AM; bladder scan taken and showed 463 mLs. In-and-out cath performed by this RN per CCM orders. 400 cc of amber urine drained from bladder; UA sent to lab per order. This RN will continue to monitor output.

## 2020-04-13 NOTE — Progress Notes (Signed)
OG advanced 5cm per verbal order from MD. Patient tolerated well. Awaiting follow-up x-ray.

## 2020-04-13 NOTE — TOC Progression Note (Signed)
Transition of Care [facility]) - Progression Note    Patient Details  Name: AMILEY SHISHIDO MRN: [id number] Date of Birth: 02/18/1991  Transition of Care [facility]) CM/SW Contact  Golda Acre, RN Phone Number: 04/13/2020, 8:35 AM  Clinical Narrative:    Remains on the vent, temp 100.1, na 146, ammonia down to 33 from 91, wbc-25.6, chronulac per tube, iv rocephin, tube feeds, d5w at 50cc/hr,nausea and vomiting this am ng tube to suction inserted. Following for progression and toc needs. etoh hepatits.   Expected Discharge Plan: Home/Self Care Barriers to Discharge: Continued Medical Work up  Expected Discharge Plan and Services Expected Discharge Plan: Home/Self Care In-house Referral: Financial Counselor Discharge Planning Services: CM Consult   Living arrangements for the past 2 months: Single Family Home                                       Social Determinants of Health (SDOH) Interventions    Readmission Risk Interventions No flowsheet data found.

## 2020-04-13 NOTE — Progress Notes (Signed)
NAME:  Gina Hooper, MRN:  [id number], DOB:  [birth year], LOS: 9 ADMISSION DATE:  04/04/2020, CONSULTATION DATE:  8/12 REFERRING MD:  Mahala Menghini , CHIEF COMPLAINT:  Acute encephalopathy    Brief History   29 year old female with history of severe alcoholic dependence, and polysubstance abuse admitted with chief complaint of abdominal pain nausea and vomiting on 8/11 found to be in acute liver failure in the setting of alcoholic hepatitis with profound coagulopathy as well as hematemesis (two episodes of large volume emesis with frank blood early in admit).  Suspected component of tylenol abuse as well, s/p NAC infusion. UDS positive for cocaine + cannabis.  ETOH positive on admit.  Hep A & C non-reactive. She has been declined as a transplant candidate by [facility].  Critical care asked to evaluate on 8/12 given declining mental status and concern about IV access.    Past Medical History  ETOH abuse, alcohol related hepatitis, anemia, prior osteo of left ankle. Substance abuse, coagulopathy   Significant Hospital Events   8/11 admitted. Discriminant fx score 304. MELD-Na 40, [name], t bili 4.8.  Hepatomegly, non-specific GB wall thickening likely reactive on CT. she was seen by GI.  [facility] hepatology/transplant: she is not a candidate for transplant.  Recommendations were to hydrate, correct coagulopathy, start acetylcysteine infusion for possible component of Tylenol toxicity on top of alcoholic hepatitis.  Recommended starting systemic steroids once systemic infection ruled out 8/16 Remains on vent, not waking  Consults:  Gastroenterology consulted 8/11 Critical care consulted 9/11    Procedures:  R IJ  8/13 >> ETT  8/13 >>   Significant Diagnostic Tests:  UDS 8/11 >> opiate / THC / cocaine positive  CT ABD 8/11 >> borderline hepatomegaly with diffuse patchy low-attenuation with concern for acute inflammation/hepatitis.  Gallbladder wall thickening with pericholecystic fluid felt  reactive there were no gallstones.  There is a small right effusion.  Mild ascites in the pelvis. MRI Brain 8/14 >> no acute intracranial process. Mild pansinus disease  EEG 8/16 >> suggestive of severe diffuse encephalopathy, non-specific etiology ECHO 8/19 >> LVEF 65-70%, no RWMA, RV systolic function normal LE Venous Duplex 8/19 >> negative  Micro Data:  COVID 8/11 >> neg MRSA PCR 8/11 >> neg  BCx2 8/18 >>  UA 8/20 >>  Antimicrobials:  Ceftriaxone 8/19 >>  Interim history/subjective:  Episode of vomiting overnight, TF held  Fever curve improved, tmax 98.7 / WBC 25.6 Glucose range 150-180 UOP 1L, 1.1L stool, +69ml in last 24 hours   Objective   Blood pressure 114/72, pulse (!) 101, temperature 98.7 F (37.1 C), temperature source Oral, resp. rate 18, height 5\' 6"  (1.676 m), weight 69.8 kg, last menstrual period 03/25/2020, SpO2 99 %. CVP:  [4 mmHg] 4 mmHg  Vent Mode: PSV;CPAP FiO2 (%):  [30 %] 30 % Set Rate:  [16 bmp] 16 bmp Vt Set:  [470 mL] 470 mL PEEP:  [5 cmH20] 5 cmH20 Pressure Support:  [10 cmH20] 10 cmH20 Plateau Pressure:  [14 cmH20-19 cmH20] 16 cmH20   Intake/Output Summary (Last 24 hours) at 04/13/2020 1000 Last data filed at 04/13/2020 0800 Gross per 24 hour  Intake 1867.67 ml  Output 2200 ml  Net -332.33 ml   Filed Weights   04/04/20 1648 04/11/20 0500 04/12/20 0500  Weight: 70.3 kg 68.2 kg 69.8 kg    Examination: General: adult female lying in bed in NAD on vent   HEENT: MM pink/moist, ETT, bloody sclera, mild icterus but improved Neuro:  obtunded, moves head with loud clapping of hands, no response to painful stimuli, +corneal, + gag CV: s1s2 RRR, no m/r/g PULM: non-labored on vent, lungs bilaterally coarse GI: soft, bsx4 active  Extremities: warm/dry, trace generalized edema  Skin: no rashes or lesions  Resolved Hospital Problem list   Lactic Acidosis   Assessment & Plan:   Acute Liver Failure in the setting of Acute Alcoholic Hepatitis +/-  Tylenol Toxicity MELD-Na score 40 on admit, [name], T.Bili 4.8, coagulapathy. Discriminant fx score 304.  Completed NAC 8/14. Not a candidate for transplant per [facility].  -solumedrol 40 mg IV Q24, defer stop date to GI  -continue lactulose, rifaximin  -follow ammonia trend  -supportive care  UGIB w/ Hematemesis Acute Blood Loss Anemia  Not felt to be variceal bleed.  Likely secondary to gastritis -BID PPI -follow CBC -transfuse for Hgb <7%  Severe Coagulopathy in the setting of Acute Liver Failure Thrombocytopenia in setting Liver Disease S/P FFP, Vit K  -follow coags, CBC -concern for grim prognosis    Acute Respiratory Failure secondary to Acute Liver Failure, Hepatic Encephalopathy  -PRVC 8cc/kg -wean PEEP / FiO2 for sats >90% -mental status remains barrier for extubation  -follow intermittent CXR   Tachycardia  Fever  Suspect reactive but may be component of volume depletion  -follow cultures -lines removed -no clear source infection, DVT negative  -empiric rocephin for SBP coverage   AKI -Trend BMP / urinary output -Replace electrolytes as indicated -Avoid nephrotoxic agents, ensure adequate renal perfusion  Acute Hepatic / Metabolic Encephalopathy Alcohol Withdrawal. MRI negative 8/13. EEG 8/16 with diffuse encephalopathy  -continue lactulose, rifaximin  -folate, MVI, thiamine -await neuro recovery, may need to call Neurology back for further prognostication   Hypokalemia Hypophosphatemia  Hypernatremia -continue free water, 300 ml Q4 -D5W at 50 ml/hr -follow Na trend   Nausea / Vomiting  -TF held, resume TF after first dose reglan -no evidence of ileus on KUB, reglan 5mg  IV Q8 x4 doses  Hyperglycemia  -SSI, very sensitive scale with liver dysfunction   Moderate Protein Calorie Malnutrition  -TF per Nutrition   Best practice:  Diet: NPO Pain/Anxiety/Delirium protocol (if indicated): holding sedation for neuro exam  VAP protocol (if  indicated): in place  DVT prophylaxis: SCD GI prophylaxis: PPI  Glucose control: NA Mobility: BR Code Status: full code  Family Communication: Father called, message left for return call 8/20 Disposition: ICU   CC Time: 34 minutes   9/20, MSN, NP-C [facility] 04/13/2020, 10:00 AM   Please see [email or web address] for pager details.

## 2020-04-14 ENCOUNTER — Inpatient Hospital Stay (HOSPITAL_COMMUNITY): Payer: Medicaid Other

## 2020-04-14 DIAGNOSIS — J9601 Acute respiratory failure with hypoxia: Secondary | ICD-10-CM | POA: Diagnosis not present

## 2020-04-14 LAB — GLUCOSE, CAPILLARY
Glucose-Capillary: 169 mg/dL — ABNORMAL HIGH (ref 70–99)
Glucose-Capillary: 142 mg/dL — ABNORMAL HIGH (ref 70–99)
Glucose-Capillary: 121 mg/dL — ABNORMAL HIGH (ref 70–99)
Glucose-Capillary: 149 mg/dL — ABNORMAL HIGH (ref 70–99)
Glucose-Capillary: 134 mg/dL — ABNORMAL HIGH (ref 70–99)
Glucose-Capillary: 126 mg/dL — ABNORMAL HIGH (ref 70–99)

## 2020-04-14 LAB — CBC
HCT: 29 % — ABNORMAL LOW (ref 36.0–46.0)
Hemoglobin: 9.4 g/dL — ABNORMAL LOW (ref 12.0–15.0)
MCH: 26.7 pg (ref 26.0–34.0)
MCHC: 32.4 g/dL (ref 30.0–36.0)
MCV: 82.4 fL (ref 80.0–100.0)
Platelets: 78 10*3/uL — ABNORMAL LOW (ref 150–400)
RBC: 3.52 MIL/uL — ABNORMAL LOW (ref 3.87–5.11)
RDW: 24 % — ABNORMAL HIGH (ref 11.5–15.5)
WBC: 18.6 10*3/uL — ABNORMAL HIGH (ref 4.0–10.5)
nRBC: 1.6 % — ABNORMAL HIGH (ref 0.0–0.2)

## 2020-04-14 LAB — COMPREHENSIVE METABOLIC PANEL
ALT: 410 U/L — ABNORMAL HIGH (ref 0–44)
AST: 281 U/L — ABNORMAL HIGH (ref 15–41)
Albumin: 2.3 g/dL — ABNORMAL LOW (ref 3.5–5.0)
Alkaline Phosphatase: 123 U/L (ref 38–126)
Anion gap: 9 (ref 5–15)
BUN: 41 mg/dL — ABNORMAL HIGH (ref 6–20)
CO2: 26 mmol/L (ref 22–32)
Calcium: 7.8 mg/dL — ABNORMAL LOW (ref 8.9–10.3)
Chloride: 111 mmol/L (ref 98–111)
Creatinine, Ser: <0.3 mg/dL — ABNORMAL LOW (ref 0.44–1.00)
Glucose, Bld: 158 mg/dL — ABNORMAL HIGH (ref 70–99)
Potassium: 5.6 mmol/L — ABNORMAL HIGH (ref 3.5–5.1)
Sodium: 146 mmol/L — ABNORMAL HIGH (ref 135–145)
Total Bilirubin: 22.6 mg/dL (ref 0.3–1.2)
Total Protein: 5.2 g/dL — ABNORMAL LOW (ref 6.5–8.1)

## 2020-04-14 LAB — PROCALCITONIN: Procalcitonin: 2.01 ng/mL

## 2020-04-14 LAB — PROTIME-INR
INR: 1.6 — ABNORMAL HIGH (ref 0.8–1.2)
Prothrombin Time: 18.6 s — ABNORMAL HIGH (ref 11.4–15.2)

## 2020-04-14 MED ORDER — FOLIC ACID 1 MG PO TABS
1.0000 mg | ORAL_TABLET | Freq: Every day | ORAL | Status: DC
  Administered 2020-04-14: 1 mg
  Filled 2020-04-14 (×2): qty 1

## 2020-04-14 MED ORDER — THIAMINE HCL 100 MG PO TABS
100.0000 mg | ORAL_TABLET | Freq: Every day | ORAL | Status: DC
  Administered 2020-04-14 – 2020-04-23 (×4): 100 mg
  Filled 2020-04-14 (×6): qty 1

## 2020-04-14 MED ORDER — THIAMINE HCL 100 MG/ML IJ SOLN
100.0000 mg | Freq: Every day | INTRAMUSCULAR | Status: DC
  Administered 2020-04-16 – 2020-04-22 (×6): 100 mg via INTRAVENOUS
  Filled 2020-04-14 (×6): qty 2

## 2020-04-14 MED ORDER — PANTOPRAZOLE SODIUM 40 MG PO PACK
40.0000 mg | PACK | ORAL | Status: DC
  Administered 2020-04-14: 40 mg
  Filled 2020-04-14 (×2): qty 20

## 2020-04-14 NOTE — Progress Notes (Signed)
NAME:  Gina Hooper, MRN:  [id number], DOB:  Oct 16, 1990, LOS: 10 ADMISSION DATE:  04/04/2020, CONSULTATION DATE:  8/12 REFERRING MD:  Mahala Menghini , CHIEF COMPLAINT:  Acute encephalopathy    Brief History   29 year old female with history of severe alcoholic dependence, and polysubstance abuse admitted with chief complaint of abdominal pain nausea and vomiting on 8/11 found to be in acute liver failure in the setting of alcoholic hepatitis with profound coagulopathy as well as hematemesis (two episodes of large volume emesis with frank blood early in admit).  Suspected component of tylenol abuse as well, s/p NAC infusion. UDS positive for cocaine + cannabis.  ETOH positive on admit.  Hep A & C non-reactive. She has been declined as a transplant candidate by [facility].  Critical care asked to evaluate on 8/12 given declining mental status and concern about IV access.    Past Medical History  ETOH abuse, alcohol related hepatitis, anemia, prior osteo of left ankle. Substance abuse, coagulopathy   Significant Hospital Events   8/11 admitted. Discriminant fx score 304. MELD-Na 40, [name], t bili 4.8.  Hepatomegly, non-specific GB wall thickening likely reactive on CT. she was seen by GI.  [facility] hepatology/transplant: she is not a candidate for transplant.  Recommendations were to hydrate, correct coagulopathy, start acetylcysteine infusion for possible component of Tylenol toxicity on top of alcoholic hepatitis.  Recommended starting systemic steroids once systemic infection ruled out 8/16 Remains on vent, not waking  Consults:  Gastroenterology consulted 8/11 Neurology 8/15  Procedures:  R IJ  8/13 >> ETT  8/13 >>   Significant Diagnostic Tests:  UDS 8/11 >> opiate / THC / cocaine positive  CT ABD 8/11 >> borderline hepatomegaly with diffuse patchy low-attenuation with concern for acute inflammation/hepatitis.  Gallbladder wall thickening with pericholecystic fluid felt reactive there  were no gallstones.  There is a small right effusion.  Mild ascites in the pelvis. MRI Brain 8/14 >> no acute intracranial process. Mild pansinus disease  EEG 8/16 >> suggestive of severe diffuse encephalopathy, non-specific etiology ECHO 8/19 >> LVEF 65-70%, no RWMA, RV systolic function normal LE Venous Duplex 8/19 >> negative  Micro Data:  COVID 8/11 >> neg MRSA PCR 8/11 >> neg  BCx2 8/18 >>   Antimicrobials:  Ceftriaxone 8/19 >>  Interim history/subjective:  Low RR with SBT.  Received one dose of ativan overnight.  Objective   Blood pressure 126/81, pulse (!) 119, temperature 100.1 F (37.8 C), temperature source Axillary, resp. rate 17, height 5\' 6"  (1.676 m), weight 69.8 kg, last menstrual period 03/25/2020, SpO2 100 %.    Vent Mode: PRVC FiO2 (%):  [30 %] 30 % Set Rate:  [16 bmp] 16 bmp Vt Set:  [470 mL] 470 mL PEEP:  [5 cmH20] 5 cmH20 Pressure Support:  [10 cmH20] 10 cmH20 Plateau Pressure:  [14 cmH20-15 cmH20] 15 cmH20   Intake/Output Summary (Last 24 hours) at 04/14/2020 0729 Last data filed at 04/13/2020 1850 Gross per 24 hour  Intake 436.37 ml  Output 880 ml  Net -443.63 ml   Filed Weights   04/04/20 1648 04/11/20 0500 04/12/20 0500  Weight: 70.3 kg 68.2 kg 69.8 kg    Examination:  General - on vent Eyes - jaundice ENT - ETT in place Cardiac - regular rate/rhythm, no murmur Chest - equal breath sounds b/l, no wheezing or rales Abdomen - soft, non tender, + bowel sounds Extremities - no cyanosis, clubbing, or edema Skin - no rashes Neuro - not  following commands     Resolved Hospital Problem list   Lactic Acidosis, Upper GI bleeding with hematemesis from gastritis, Coagulopathy from liver failure, AKI from ATN  Assessment & Plan:   Acute Liver Failure in the setting of Acute Alcoholic Hepatitis +/- Tylenol Toxicity - MELD-Na score 40 on admit, [name], T.Bili 4.8, coagulapathy. Discriminant fx score 304.  Completed NAC 8/14. Not a  candidate for transplant per [facility].  - completed course of solumedrol - f/u LFTs  Fever, possible SBP. - day 3 of rocephin  Anemia of critical illness. Thrombocytopenia 2nd to liver disease. - f/u CBC - transfuse for Hb < 7 or significant bleeding    Acute Respiratory Failure secondary to Acute Liver Failure, Hepatic Encephalopathy  - pressure support as able - mental status barrier to extubation trial  Sinus tachycardia.  - likely from fever   Acute Hepatic / Metabolic Encephalopathy. Alcohol Withdrawal. - MRI negative 8/13. EEG 8/16 with diffuse encephalopathy  - continue lactulose, rifaximin - continue folic acid, MVI, thiamine - neuro consulted  Hyperglycemia  -SSI, very sensitive scale with liver dysfunction   Moderate Protein Calorie Malnutrition  -TF per Nutrition   Best practice:  Diet: NPO DVT prophylaxis: SCD GI prophylaxis: Protonix Mobility: BR Code Status: full code  Disposition: ICU   CC time 32 minutes  Coralyn Helling, MD [facility] Pulmonary/Critical Care Pager - [phone] 04/14/2020, 7:35 AM

## 2020-04-15 DIAGNOSIS — J9601 Acute respiratory failure with hypoxia: Secondary | ICD-10-CM | POA: Diagnosis not present

## 2020-04-15 LAB — GLUCOSE, CAPILLARY
Glucose-Capillary: 113 mg/dL — ABNORMAL HIGH (ref 70–99)
Glucose-Capillary: 125 mg/dL — ABNORMAL HIGH (ref 70–99)
Glucose-Capillary: 130 mg/dL — ABNORMAL HIGH (ref 70–99)
Glucose-Capillary: 74 mg/dL (ref 70–99)
Glucose-Capillary: 76 mg/dL (ref 70–99)
Glucose-Capillary: 93 mg/dL (ref 70–99)
Glucose-Capillary: 97 mg/dL (ref 70–99)

## 2020-04-15 LAB — BASIC METABOLIC PANEL WITH GFR
Anion gap: 8 (ref 5–15)
BUN: 25 mg/dL — ABNORMAL HIGH (ref 6–20)
CO2: 24 mmol/L (ref 22–32)
Calcium: 7.9 mg/dL — ABNORMAL LOW (ref 8.9–10.3)
Chloride: 115 mmol/L — ABNORMAL HIGH (ref 98–111)
Creatinine, Ser: <0.3 mg/dL — ABNORMAL LOW (ref 0.44–1.00)
Glucose, Bld: 104 mg/dL — ABNORMAL HIGH (ref 70–99)
Potassium: 4.3 mmol/L (ref 3.5–5.1)
Sodium: 147 mmol/L — ABNORMAL HIGH (ref 135–145)

## 2020-04-15 LAB — CBC
HCT: 27.4 % — ABNORMAL LOW (ref 36.0–46.0)
Hemoglobin: 9.1 g/dL — ABNORMAL LOW (ref 12.0–15.0)
MCH: 27.5 pg (ref 26.0–34.0)
MCHC: 33.2 g/dL (ref 30.0–36.0)
MCV: 82.8 fL (ref 80.0–100.0)
Platelets: 74 10*3/uL — ABNORMAL LOW (ref 150–400)
RBC: 3.31 MIL/uL — ABNORMAL LOW (ref 3.87–5.11)
RDW: 24.6 % — ABNORMAL HIGH (ref 11.5–15.5)
WBC: 14.7 10*3/uL — ABNORMAL HIGH (ref 4.0–10.5)
nRBC: 1 % — ABNORMAL HIGH (ref 0.0–0.2)

## 2020-04-15 LAB — COMPREHENSIVE METABOLIC PANEL
ALT: 389 U/L — ABNORMAL HIGH (ref 0–44)
AST: 291 U/L — ABNORMAL HIGH (ref 15–41)
Albumin: 2.3 g/dL — ABNORMAL LOW (ref 3.5–5.0)
Alkaline Phosphatase: 118 U/L (ref 38–126)
Anion gap: 9 (ref 5–15)
BUN: 31 mg/dL — ABNORMAL HIGH (ref 6–20)
CO2: 24 mmol/L (ref 22–32)
Calcium: 8.1 mg/dL — ABNORMAL LOW (ref 8.9–10.3)
Chloride: 116 mmol/L — ABNORMAL HIGH (ref 98–111)
Creatinine, Ser: 0.59 mg/dL (ref 0.44–1.00)
GFR calc Af Amer: >60 mL/min (ref >60)
GFR calc non Af Amer: >60 mL/min (ref >60)
Glucose, Bld: 130 mg/dL — ABNORMAL HIGH (ref 70–99)
Potassium: 3.1 mmol/L — ABNORMAL LOW (ref 3.5–5.1)
Sodium: 149 mmol/L — ABNORMAL HIGH (ref 135–145)
Total Bilirubin: 23 mg/dL (ref 0.3–1.2)
Total Protein: 5.6 g/dL — ABNORMAL LOW (ref 6.5–8.1)

## 2020-04-15 LAB — PROTIME-INR
INR: 1.5 — ABNORMAL HIGH (ref 0.8–1.2)
Prothrombin Time: 17.4 seconds — ABNORMAL HIGH (ref 11.4–15.2)

## 2020-04-15 LAB — PROCALCITONIN: Procalcitonin: 2.1 ng/mL

## 2020-04-15 MED ORDER — LACTATED RINGERS IV SOLN: INTRAVENOUS | Status: DC

## 2020-04-15 MED ORDER — POTASSIUM CHLORIDE 10 MEQ/50ML IV SOLN: 10.0000 meq | INTRAVENOUS | Status: DC

## 2020-04-15 MED ORDER — ADULT MULTIVITAMIN LIQUID CH
15.0000 mL | Freq: Every day | ORAL | Status: DC
  Administered 2020-04-17 – 2020-04-19 (×2): 15 mL
  Filled 2020-04-15 (×6): qty 15

## 2020-04-15 MED ORDER — MIDAZOLAM HCL 2 MG/2ML IJ SOLN
1.0000 mg | Freq: Once | INTRAMUSCULAR | Status: AC
  Administered 2020-04-15: 1 mg via INTRAVENOUS
  Filled 2020-04-15: qty 2

## 2020-04-15 MED ORDER — PANTOPRAZOLE SODIUM 40 MG IV SOLR
40.0000 mg | INTRAVENOUS | Status: DC
  Administered 2020-04-15 – 2020-04-23 (×9): 40 mg via INTRAVENOUS
  Filled 2020-04-15 (×9): qty 40

## 2020-04-15 MED ORDER — POTASSIUM CHLORIDE 20 MEQ/15ML (10%) PO SOLN
20.0000 meq | ORAL | Status: AC
  Administered 2020-04-15 (×2): 20 meq
  Filled 2020-04-15 (×2): qty 15

## 2020-04-15 MED ORDER — FOLIC ACID 1 MG PO TABS
1.0000 mg | ORAL_TABLET | Freq: Once | ORAL | Status: AC
  Administered 2020-04-15: 1 mg

## 2020-04-15 MED ORDER — POTASSIUM CHLORIDE 10 MEQ/100ML IV SOLN
10.0000 meq | INTRAVENOUS | Status: AC
  Administered 2020-04-15 (×3): 10 meq via INTRAVENOUS
  Filled 2020-04-15 (×2): qty 100

## 2020-04-15 MED ORDER — RIFAXIMIN 550 MG PO TABS
550.0000 mg | ORAL_TABLET | Freq: Two times a day (BID) | ORAL | Status: DC
  Administered 2020-04-15 – 2020-04-18 (×4): 550 mg
  Filled 2020-04-15 (×8): qty 1

## 2020-04-15 MED ORDER — LACTULOSE 10 GM/15ML PO SOLN
30.0000 g | Freq: Every day | ORAL | Status: DC
  Administered 2020-04-17 – 2020-04-23 (×7): 30 g
  Filled 2020-04-15: qty 60
  Filled 2020-04-15 (×2): qty 45
  Filled 2020-04-15: qty 60
  Filled 2020-04-15: qty 45
  Filled 2020-04-15: qty 60
  Filled 2020-04-15: qty 45

## 2020-04-15 MED ORDER — SODIUM CHLORIDE 0.9 % IV SOLN
1.0000 mg | Freq: Once | INTRAVENOUS | Status: AC
  Filled 2020-04-15: qty 0.2

## 2020-04-15 MED ORDER — POTASSIUM CHLORIDE 10 MEQ/100ML IV SOLN
INTRAVENOUS | Status: AC
  Administered 2020-04-15: 10 meq
  Filled 2020-04-15: qty 100

## 2020-04-15 MED ORDER — POTASSIUM CHLORIDE 20 MEQ/15ML (10%) PO SOLN: 20.0000 meq | ORAL | Status: DC

## 2020-04-15 NOTE — Progress Notes (Signed)
NAME:  Gina Hooper, MRN:  [id number], DOB:  [birth year], LOS: 11 ADMISSION DATE:  04/04/2020, CONSULTATION DATE:  8/12 REFERRING MD:  Mahala Menghini , CHIEF COMPLAINT:  Acute encephalopathy    Brief History   29 year old female with history of severe alcoholic dependence, and polysubstance abuse admitted with chief complaint of abdominal pain nausea and vomiting on 8/11 found to be in acute liver failure in the setting of alcoholic hepatitis with profound coagulopathy as well as hematemesis (two episodes of large volume emesis with frank blood early in admit).  Suspected component of tylenol abuse as well, s/p NAC infusion. UDS positive for cocaine + cannabis.  ETOH positive on admit.  Hep A & C non-reactive. She has been declined as a transplant candidate by [facility].  Critical care asked to evaluate on 8/12 given declining mental status and concern about IV access.    Past Medical History  ETOH abuse, alcohol related hepatitis, anemia, prior osteo of left ankle. Substance abuse, coagulopathy   Significant Hospital Events   8/11 admitted. Discriminant fx score 304. MELD-Na 40, [name], t bili 4.8.  Hepatomegly, non-specific GB wall thickening likely reactive on CT. she was seen by GI.  [facility] hepatology/transplant: she is not a candidate for transplant.  Recommendations were to hydrate, correct coagulopathy, start acetylcysteine infusion for possible component of Tylenol toxicity on top of alcoholic hepatitis.  Recommended starting systemic steroids once systemic infection ruled out 8/16 Remains on vent, not waking  Consults:  Gastroenterology consulted 8/11 Neurology 8/15  Procedures:  R IJ  8/13 >> ETT  8/13 >> 8/22  Significant Diagnostic Tests:  UDS 8/11 >> opiate / THC / cocaine positive  CT ABD 8/11 >> borderline hepatomegaly with diffuse patchy low-attenuation with concern for acute inflammation/hepatitis.  Gallbladder wall thickening with pericholecystic fluid felt reactive there  were no gallstones.  There is a small right effusion.  Mild ascites in the pelvis. MRI Brain 8/14 >> no acute intracranial process. Mild pansinus disease  EEG 8/16 >> suggestive of severe diffuse encephalopathy, non-specific etiology ECHO 8/19 >> LVEF 65-70%, no RWMA, RV systolic function normal LE Venous Duplex 8/19 >> negative  Micro Data:  COVID 8/11 >> neg MRSA PCR 8/11 >> neg  BCx2 8/18 >>   Antimicrobials:  Ceftriaxone 8/19 >>  Interim history/subjective:  Pressure support.  Objective   Blood pressure (!) 141/94, pulse (!) 105, temperature 97.8 F (36.6 C), temperature source Axillary, resp. rate 15, height 5\' 6"  (1.676 m), weight 69.8 kg, last menstrual period 03/25/2020, SpO2 100 %.    Vent Mode: PSV;CPAP FiO2 (%):  [30 %] 30 % Set Rate:  [16 bmp] 16 bmp Vt Set:  [470 mL] 470 mL PEEP:  [5 cmH20] 5 cmH20 Pressure Support:  [8 cmH20] 8 cmH20 Plateau Pressure:  [10 cmH20-16 cmH20] 16 cmH20   Intake/Output Summary (Last 24 hours) at 04/15/2020 0856 Last data filed at 04/15/2020 04/17/2020 Gross per 24 hour  Intake 1122.01 ml  Output 1675 ml  Net -552.99 ml   Filed Weights   04/04/20 1648 04/11/20 0500 04/12/20 0500  Weight: 70.3 kg 68.2 kg 69.8 kg    Examination:  General - more alert Eyes - pupils reactive ENT - ETT in place Cardiac - regular rate/rhythm, no murmur Chest - equal breath sounds b/l, no wheezing or rales Abdomen - soft, non tender, + bowel sounds Extremities - no cyanosis, clubbing, or edema Skin - no rashes Neuro - follows simple commands, moves extremities   Resolved  Hospital Problem list   Lactic Acidosis, Upper GI bleeding with hematemesis from gastritis, Coagulopathy from liver failure, AKI from ATN  Assessment & Plan:   Acute Liver Failure in the setting of Acute Alcoholic Hepatitis +/- Tylenol Toxicity - MELD-Na score 40 on admit, [name], T.Bili 4.8, coagulapathy. Discriminant fx score 304.  Completed NAC 8/14. Not a candidate  for transplant per [facility].  - completed course of solumedrol - f/u LFTs intermittently  Fever, possible SBP. - day 4 of rocephin  Anemia of critical illness. Thrombocytopenia 2nd to liver disease. - f/u CBC - transfuse for Hb < 7 or significant bleeding    Acute Respiratory Failure secondary to Acute Liver Failure, Hepatic Encephalopathy  - extubation trial 8/22  Sinus tachycardia.  - likely from fever  - monitor on tele  Acute Hepatic / Metabolic Encephalopathy. Alcohol Withdrawal. - MRI negative 8/13. EEG 8/16 with diffuse encephalopathy  - continue rifaximin - continue folic acid, MVI, thiamine - neuro consulted - mental status improved 8/22 - change lactulose to daily  Hyperglycemia  - SSI, very sensitive scale  Moderate Protein Calorie Malnutrition  - speech to assess swallowing after extubation  Deconditioning. - PT/OT  Best practice:  Diet: NPO DVT prophylaxis: SCD GI prophylaxis: Protonix Mobility: BR Code Status: full code  Disposition: ICU  Labs:   CMP Latest Ref Rng & Units 04/15/2020 04/14/2020 04/13/2020  Glucose 70 - 99 mg/dL 676(H) 209(O) 709(G)  BUN 6 - 20 mg/dL 28(Z) 66(Q) 94(T)  Creatinine 0.44 - 1.00 mg/dL 6.54 <6.50(P) 5.46  Sodium 135 - 145 mmol/L 149(H) 146(H) 146(H)  Potassium 3.5 - 5.1 mmol/L 3.1(L) 5.6(H) 4.1  Chloride 98 - 111 mmol/L 116(H) 111 111  CO2 22 - 32 mmol/L 24 26 26   Calcium 8.9 - 10.3 mg/dL 8.1(L) 7.8(L) 7.8(L)  Total Protein 6.5 - 8.1 g/dL ) 5.2(L) 5.4(L)  Total Bilirubin 0.3 - 1.2 mg/dL 23.0(HH) 22.6(HH) 21.3(HH)  Alkaline Phos 38 - 126 U/L 118 123 123  AST 15 - 41 U/L 291(H) 281(H) 189(H)  ALT 0 - 44 U/L 389(H) 410(H) 433(H)    CBC Latest Ref Rng & Units 04/15/2020 04/14/2020 04/13/2020  WBC 4.0 - 10.5 K/uL 14.7(H) 18.6(H) 25.6(H)  Hemoglobin 12.0 - 15.0 g/dL 04/15/2020) 1.2(X) 10.0(L)  Hematocrit 36 - 46 % 27.4(L) 29.0(L) 30.1(L)  Platelets 150 - 400 K/uL 74(L) 78(L) 87(L)    ABG    Component Value Date/Time    PHART 7.399 04/07/2020 0500   PCO2ART 42.3 04/07/2020 0500   PO2ART 57.1 (L) 04/07/2020 0500   HCO3 26.2 04/07/2020 0500   TCO2 21 (L) 11/28/2017 0153   ACIDBASEDEF 3.7 (H) 04/05/2020 0827   O2SAT 90.0 04/07/2020 0500    CBG (last 3)  Recent Labs    04/14/20 2359 04/15/20 0354 04/15/20 0844  GLUCAP 125* 130* 113*    Critical care time: 33 minutes  04/17/20, MD [place] [facility] Pager - [phone] - [phone] 04/15/2020, 9:00 AM

## 2020-04-15 NOTE — Evaluation (Signed)
Physical Therapy Evaluation Patient Details Name: Gina Hooper MRN: [id number] DOB: 1990/11/12 Today's Date: 04/15/2020   History of Present Illness  Pt admitted with acute encephalopathy and noted to be in acute liver failure in the setting of Alchoholic Hepatitis with profound coagulopathy and hematemesis.  Pt with hx of ETOH and polysubstance abuse as well as osteomylitis of L ankle  Clinical Impression  Pt admitted as above and presenting with functional mobility limitations 2* generalized weakness, significant balance deficits, and cognitive deficits with limited ability to respond appropriately or follow cues.  This date, pt up to EOB sitting x 10+ minutes but at no time able to hold balance unassisted and noted to be unstable in all directions.  Pt may need follow up at SNF level dependent on acute stay progress physically and cognitively.     Follow Up Recommendations SNF;Other (comment) (dependent on acute stay progress)    Equipment Recommendations  None recommended by PT    Recommendations for Other Services OT consult     Precautions / Restrictions Precautions Precautions: Fall Restrictions Weight Bearing Restrictions: No      Mobility  Bed Mobility Overal bed mobility: Needs Assistance Bed Mobility: Supine to Sit;Sit to Supine     Supine to sit: Total assist;+2 for physical assistance Sit to supine: Total assist;+2 for physical assistance   General bed mobility comments: Full assist to bend knees, roll to L and move to sitting  Transfers                 General transfer comment: NT - pt unable to hold balance sitting EOB and with min active movement noted all extremeties - unsafe to attempt standing at this time  Ambulation/Gait                Stairs            Wheelchair Mobility    Modified Rankin (Stroke Patients Only)       Balance Overall balance assessment: Needs assistance Sitting-balance support: Bilateral upper  extremity supported;Feet supported Sitting balance-Leahy Scale: Poor Sitting balance - Comments: min to mod assist to maintain balance in sitting at EOB.  Pt unstable in all directions                                     Pertinent Vitals/Pain Pain Assessment: No/denies pain    Home Living Family/patient expects to be discharged to:: Private residence Living Arrangements: Other relatives (grandmother)               Additional Comments: Ltd information from chart - pt unable to provide at this time    Prior Function           Comments: Pt unable to provide information     Hand Dominance        Extremity/Trunk Assessment   Upper Extremity Assessment Upper Extremity Assessment: Difficult to assess due to impaired cognition    Lower Extremity Assessment Lower Extremity Assessment: Difficult to assess due to impaired cognition    Cervical / Trunk Assessment Cervical / Trunk Assessment: Normal  Communication   Communication: Other (comment) (min verbalization beyond occasional cursing - extubated toda)  Cognition Arousal/Alertness: Lethargic Behavior During Therapy: Flat affect;Impulsive Overall Cognitive Status: No family/caregiver present to determine baseline cognitive functioning  General Comments: Pt following min cues but occasionally responding to verbal input - primarily with a curse word      General Comments      Exercises     Assessment/Plan    PT Assessment Patient needs continued PT services  PT Problem List Decreased strength;Decreased activity tolerance;Decreased balance;Decreased mobility;Decreased knowledge of use of DME       PT Treatment Interventions DME instruction;Gait training;Stair training;Functional mobility training;Therapeutic activities;Therapeutic exercise;Balance training;Patient/family education    PT Goals (Current goals can be found in the Care Plan section)   Acute Rehab PT Goals Patient Stated Goal: No goals stated PT Goal Formulation: Patient unable to participate in goal setting Time For Goal Achievement: 04/29/20 Potential to Achieve Goals: Fair    Frequency Min 3X/week   Barriers to discharge        Co-evaluation               AM-PAC PT "6 Clicks" Mobility  Outcome Measure Help needed turning from your back to your side while in a flat bed without using bedrails?: Total Help needed moving from lying on your back to sitting on the side of a flat bed without using bedrails?: Total Help needed moving to and from a bed to a chair (including a wheelchair)?: Total Help needed standing up from a chair using your arms (e.g., wheelchair or bedside chair)?: Total Help needed to walk in hospital room?: Total Help needed climbing 3-5 steps with a railing? : Total 6 Click Score: 6    End of Session   Activity Tolerance: Patient limited by fatigue Patient left: in bed;with call bell/phone within reach;with bed alarm set;with nursing/sitter in room Nurse Communication: Mobility status PT Visit Diagnosis: Unsteadiness on feet (R26.81);Muscle weakness (generalized) (M62.81);Difficulty in walking, not elsewhere classified (R26.2)    Time: 6761-9509 PT Time Calculation (min) (ACUTE ONLY): 28 min   Charges:   PT Evaluation $PT Eval Moderate Complexity: 1 Mod PT Treatments $Therapeutic Activity: 8-22 mins        Mauro Kaufmann PT Acute Rehabilitation Services Pager [phone] Office [phone]    Atari Novick 04/15/2020, 5:23 PM

## 2020-04-15 NOTE — Progress Notes (Signed)
[facility] ADULT ICU REPLACEMENT PROTOCOL   The patient does apply for the [facility] Adult ICU Electrolyte Replacment Protocol based on the criteria listed below:   1. Is GFR >/= 30 ml/min? Yes.    Patient's GFR today is >60 2. Is SCr </= 2? Yes.   Patient's SCr is 0.59 ml/kg/hr 3. Did SCr increase >/= 0.5 in 24 hours? No. 4. Abnormal electrolyte(s): K+3.15. Ordered repletion with: protocol 6. If a panic level lab has been reported, has the CCM MD in charge been notified? Yes.  .   Physician:  Dr.Sommer  Lolita Lenz 04/15/2020 6:24 AM

## 2020-04-15 NOTE — Procedures (Signed)
Extubation Procedure Note  Patient Details:   Name: Gina Hooper DOB: Jan 06, 1991 MRN: [id number]   Airway Documentation:    Vent end date: 04/15/20 Vent end time: 1002   Evaluation  O2 sats: stable throughout Complications: No apparent complications Patient did tolerate procedure well. Bilateral Breath Sounds: Diminished, Rhonchi   Placed on 2l nasal cannula  Renold Genta 04/15/2020, 10:05 AM

## 2020-04-15 NOTE — Progress Notes (Signed)
eLink Physician-Brief Progress Note [name] DOB: 10/01/1990 MRN: [id number]   Date of Service  04/15/2020  HPI/Events of Note  Agitation - Patient biting on ETT.   eICU Interventions  Plan: 1. Versed 1 mg IV X 1 now.      Intervention Category Major Interventions: Delirium, psychosis, severe agitation - evaluation and management  [name] 04/15/2020, 3:06 AM

## 2020-04-16 ENCOUNTER — Inpatient Hospital Stay (HOSPITAL_COMMUNITY): Payer: Medicaid Other

## 2020-04-16 DIAGNOSIS — K7200 Acute and subacute hepatic failure without coma: Secondary | ICD-10-CM | POA: Diagnosis not present

## 2020-04-16 LAB — CBC
HCT: 27 % — ABNORMAL LOW (ref 36.0–46.0)
Hemoglobin: 8.8 g/dL — ABNORMAL LOW (ref 12.0–15.0)
MCH: 27.6 pg (ref 26.0–34.0)
MCHC: 32.6 g/dL (ref 30.0–36.0)
MCV: 84.6 fL (ref 80.0–100.0)
Platelets: 67 10*3/uL — ABNORMAL LOW (ref 150–400)
RBC: 3.19 MIL/uL — ABNORMAL LOW (ref 3.87–5.11)
RDW: 27.6 % — ABNORMAL HIGH (ref 11.5–15.5)
WBC: 15.6 10*3/uL — ABNORMAL HIGH (ref 4.0–10.5)
nRBC: 0.3 % — ABNORMAL HIGH (ref 0.0–0.2)

## 2020-04-16 LAB — COMPREHENSIVE METABOLIC PANEL
ALT: 365 U/L — ABNORMAL HIGH (ref 0–44)
AST: 331 U/L — ABNORMAL HIGH (ref 15–41)
Albumin: 2.1 g/dL — ABNORMAL LOW (ref 3.5–5.0)
Alkaline Phosphatase: 131 U/L — ABNORMAL HIGH (ref 38–126)
Anion gap: 8 (ref 5–15)
BUN: 22 mg/dL — ABNORMAL HIGH (ref 6–20)
CO2: 22 mmol/L (ref 22–32)
Calcium: 7.9 mg/dL — ABNORMAL LOW (ref 8.9–10.3)
Chloride: 117 mmol/L — ABNORMAL HIGH (ref 98–111)
Creatinine, Ser: <0.3 mg/dL — ABNORMAL LOW (ref 0.44–1.00)
Glucose, Bld: 86 mg/dL (ref 70–99)
Potassium: 4.6 mmol/L (ref 3.5–5.1)
Sodium: 147 mmol/L — ABNORMAL HIGH (ref 135–145)
Total Bilirubin: 23.7 mg/dL (ref 0.3–1.2)
Total Protein: 5.2 g/dL — ABNORMAL LOW (ref 6.5–8.1)

## 2020-04-16 LAB — GLUCOSE, CAPILLARY
Glucose-Capillary: 136 mg/dL — ABNORMAL HIGH (ref 70–99)
Glucose-Capillary: 72 mg/dL (ref 70–99)
Glucose-Capillary: 90 mg/dL (ref 70–99)
Glucose-Capillary: 91 mg/dL (ref 70–99)
Glucose-Capillary: 95 mg/dL (ref 70–99)

## 2020-04-16 LAB — AMMONIA: Ammonia: 24 umol/L (ref 9–35)

## 2020-04-16 MED ORDER — RESOURCE THICKENUP CLEAR PO POWD
ORAL | Status: DC | PRN
  Filled 2020-04-16: qty 125

## 2020-04-16 MED ORDER — ORAL CARE MOUTH RINSE
15.0000 mL | Freq: Two times a day (BID) | OROMUCOSAL | Status: DC
  Administered 2020-04-16 – 2020-05-01 (×27): 15 mL via OROMUCOSAL

## 2020-04-16 MED ORDER — DEXTROSE IN LACTATED RINGERS 5 % IV SOLN: INTRAVENOUS | Status: DC

## 2020-04-16 MED ORDER — FOLIC ACID 5 MG/ML IJ SOLN
1.0000 mg | Freq: Every day | INTRAMUSCULAR | Status: DC
  Administered 2020-04-16 – 2020-04-18 (×3): 1 mg via INTRAVENOUS
  Filled 2020-04-16 (×4): qty 0.2

## 2020-04-16 NOTE — Progress Notes (Signed)
Nutrition Follow-up  DOCUMENTATION CODES:   Not applicable  INTERVENTION:  - diet advancement per SLP recommendation. - will provide interventions as needed.  NUTRITION DIAGNOSIS:   Inadequate oral intake related to inability to eat as evidenced by NPO status. -ongoing  GOAL:   Patient will meet greater than or equal to 90% of their needs -unable to meet at this time.   MONITOR:   Diet advancement, Labs, Weight trends  ASSESSMENT:   29 year old female with medical history of severe alcoholic dependence and polysubstance abuse. She presented to the ED on 8/11 with abdominal pain and N/V. She was found to be in acute liver failure in the setting of alcoholic hepatitis with profound coagulopathy and hematemesis. CCM evaluated 8/12 due to declining mental status and concern about IV access.  Significant Events: 8/11- admission 8/13- intubation; OGT placement; MRI head negative 8/16- initial RD assessment; TF initiation; EEG showing diffuse encephalopathy 8/22- extubation; OGT removed   Patient was extubated yesterday. Updated estimated needs using admission weight of 66.7 kg as weight has been trending up and documentation indicates mild pitting edema to LUE and BLE and moderate pitting edema to RUE.   She remains NPO at this time and is pending SLP evaluation. Able to talk with RN who reports patient has stated she is very hungry and who has been requesting something to eat throughout the shift.     Labs reviewed; CBGs: 72 and 90 mg/dl, Na: 300 mmol/l, Cl: 762 mmol/l, BUN: 25 mg/dl, creatinine: <2.6 mg/dl, Ca: 7.9 mg/dl, LFTs elevated (slowly trending down) Medications reviewed; 1 mg folic acid/day, sliding scale novolog, 30 g lactulose/day, 15 ml multivitamin/day, 40 mg IV protonix/day, 10 mEq IV KCl x4 runs 8/22, 100 mg thiamine/day.  IVF; D5-LR @ 50 ml/hr (204 kcal).    Diet Order:   Diet Order            Diet NPO time specified  Diet effective now                  EDUCATION NEEDS:   No education needs have been identified at this time  Skin:  Skin Assessment: Reviewed RN Assessment  Last BM:  8/22  Height:   Ht Readings from Last 1 Encounters:  04/04/20 5\' 6"  (1.676 m)    Weight:   Wt Readings from Last 1 Encounters:  04/12/20 69.8 kg    Estimated Nutritional Needs:  Kcal:  2005-2210 kcal Protein:  100-110 grams Fluid:  >/= 2.2 L/day     09-26-1968, MS, RD, LDN, CNSC Inpatient Clinical Dietitian RD pager # available in AMION  After hours/weekend pager # available in [facility]

## 2020-04-16 NOTE — Progress Notes (Signed)
NAME:  Gina Hooper, MRN:  [id number], DOB:  01/14/1991, LOS: 12 ADMISSION DATE:  04/04/2020, CONSULTATION DATE:  8/12 REFERRING MD:  Mahala Menghini , CHIEF COMPLAINT:  Acute encephalopathy    Brief History   29 year old female with history of severe alcoholic dependence, and polysubstance abuse admitted with chief complaint of abdominal pain nausea and vomiting on 8/11 found to be in acute liver failure in the setting of alcoholic hepatitis with profound coagulopathy as well as hematemesis (two episodes of large volume emesis with frank blood early in admit).  Suspected component of tylenol abuse as well, s/p NAC infusion. UDS positive for cocaine + cannabis.  ETOH positive on admit.  Hep A & C non-reactive. She has been declined as a transplant candidate by [facility].  Critical care asked to evaluate on 8/12 given declining mental status and concern about IV access.    Past Medical History  ETOH abuse, alcohol related hepatitis, anemia, prior osteo of left ankle. Substance abuse, coagulopathy   Significant Hospital Events   8/11 admitted. Discriminant fx score 304. MELD-Na 40, [name], t bili 4.8.  Hepatomegly, non-specific GB wall thickening likely reactive on CT. she was seen by GI.  [facility] hepatology/transplant: she is not a candidate for transplant.  Recommendations were to hydrate, correct coagulopathy, start acetylcysteine infusion for possible component of Tylenol toxicity on top of alcoholic hepatitis.  Recommended starting systemic steroids once systemic infection ruled out 8/16 Remains on vent, not waking  Consults:  Gastroenterology consulted 8/11 Neurology 8/15  Procedures:  R IJ  8/13 >> 8/19 ETT  8/13 >> 8/22  Significant Diagnostic Tests:  UDS 8/11 >> opiate / THC / cocaine positive  CT ABD 8/11 >> borderline hepatomegaly with diffuse patchy low-attenuation with concern for acute inflammation/hepatitis.  Gallbladder wall thickening with pericholecystic fluid felt reactive  there were no gallstones.  There is a small right effusion.  Mild ascites in the pelvis. MRI Brain 8/14 >> no acute intracranial process. Mild pansinus disease  EEG 8/16 >> suggestive of severe diffuse encephalopathy, non-specific etiology ECHO 8/19 >> LVEF 65-70%, no RWMA, RV systolic function normal LE Venous Duplex 8/19 >> negative  Micro Data:  COVID 8/11 >> neg MRSA PCR 8/11 >> neg  BCx2 8/18 >> not done   Antimicrobials:  Ceftriaxone 8/19 >>  Interim history/subjective:  Pt asking for ice cream  Tmax 99.1 / WBC 15.6 Denies pain / SOB   Objective   Blood pressure 128/79, pulse (!) 107, temperature 99.1 F (37.3 C), temperature source Axillary, resp. rate (!) 25, height 5\' 6"  (1.676 m), weight 69.8 kg, last menstrual period 03/25/2020, SpO2 97 %.    FiO2 (%):  [30 %] 30 %   Intake/Output Summary (Last 24 hours) at 04/16/2020 0912 Last data filed at 04/16/2020 0541 Gross per 24 hour  Intake 1081.73 ml  Output 1000 ml  Net 81.73 ml   Filed Weights   04/04/20 1648 04/11/20 0500 04/12/20 0500  Weight: 70.3 kg 68.2 kg 69.8 kg    Examination: General: adult female lying in bed in NAD HEENT: MM pink/moist, [place] O2, bloody sclera, mild icterus  Neuro: Awake / alert, MAE, asking for ice cream CV: s1s2 RRR, tachy, no m/r/g PULM: non-labored on [place] O2, lungs bilaterally clear  GI: soft, bsx4 active  Extremities: warm/dry, trace dependent edema  Skin: no rashes or lesions  Resolved Hospital Problem list   Lactic Acidosis, Upper GI bleeding with hematemesis from gastritis, Coagulopathy from liver failure, AKI from  ATN  Assessment & Plan:   Acute Liver Failure in the setting of Acute Alcoholic Hepatitis +/- Tylenol Toxicity MELD-Na score 40 on admit, [name], T.Bili 4.8, coagulapathy. Discriminant fx score 304.  Completed NAC 8/14. Not a candidate for transplant per [facility]. Completed course of solumedrol -follow LFT's intermittently -appreciate GI   Fever, possible  SBP. -D5/x rocephin  Anemia of critical illness. Thrombocytopenia 2nd to liver disease. -follow CBC -transfuse for Hgb <7%  Acute Respiratory Failure secondary to Acute Liver Failure, Hepatic Encephalopathy  -wean O2 for sats > 90% -pulmonary hygiene -IS, mobilize   Sinus tachycardia.  -suspect in setting of fever -tele monitoring   Acute Hepatic / Metabolic Encephalopathy. Alcohol Withdrawal. MRI negative 8/13. EEG 8/16 with diffuse encephalopathy  -continue lactulose, rifaximin  -folic acid, MVI, thiamine  -appreciate Neuro input   Hyperglycemia  At Risk Hypoglycemia  -SSI, very sensitive scale  -D5LR in setting of NPO status  Moderate Protein Calorie Malnutrition  -SLP assessment pending  -advance diet per SLP recommendations   Deconditioning. -PT/OT   Best practice:  Diet: NPO DVT prophylaxis: SCD GI prophylaxis: Protonix Mobility: BR Code Status: full code  Disposition: change to SDU status, to TRH as of 8/24 am   Labs:   CMP Latest Ref Rng & Units 04/15/2020 04/15/2020 04/14/2020  Glucose 70 - 99 mg/dL 376(E) 831(D) 176(H)  BUN 6 - 20 mg/dL 60(V) 37(T) 06(Y)  Creatinine 0.44 - 1.00 mg/dL <6.94(W) 5.46 <2.70(J)  Sodium 135 - 145 mmol/L 147(H) 149(H) 146(H)  Potassium 3.5 - 5.1 mmol/L 4.3 3.1(L) 5.6(H)  Chloride 98 - 111 mmol/L 115(H) 116(H) 111  CO2 22 - 32 mmol/L 24 24 26   Calcium 8.9 - 10.3 mg/dL 7.9(L) 8.1(L) 7.8(L)  Total Protein 6.5 - 8.1 g/dL - 5.6(L) 5.2(L)  Total Bilirubin 0.3 - 1.2 mg/dL - 23.0(HH) 22.6(HH)  Alkaline Phos 38 - 126 U/L - 118 123  AST 15 - 41 U/L - 291(H) 281(H)  ALT 0 - 44 U/L - 389(H) 410(H)    CBC Latest Ref Rng & Units 04/16/2020 04/15/2020 04/14/2020  WBC 4.0 - 10.5 K/uL 15.6(H) 14.7(H) 18.6(H)  Hemoglobin 12.0 - 15.0 g/dL 04/16/2020) 5.0(K) 9.3(G)  Hematocrit 36 - 46 % 27.0(L) 27.4(L) 29.0(L)  Platelets 150 - 400 K/uL 67(L) 74(L) 78(L)    ABG    Component Value Date/Time   PHART 7.399 04/07/2020 0500   PCO2ART 42.3  04/07/2020 0500   PO2ART 57.1 (L) 04/07/2020 0500   HCO3 26.2 04/07/2020 0500   TCO2 21 (L) 11/28/2017 0153   ACIDBASEDEF 3.7 (H) 04/05/2020 0827   O2SAT 90.0 04/07/2020 0500    CBG (last 3)  Recent Labs    04/15/20 2329 04/16/20 0342 04/16/20 0843  GLUCAP 76 72 90    Critical care time: n/a   04/18/20, MSN, NP-C [place] [facility] 04/16/2020, 9:12 AM   Please see [email or web address] for pager details.
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
## 2020-04-16 NOTE — Progress Notes (Signed)
Occupational Therapy Evaluation  Patient with functional deficits listed below impacting safety and independence with self care. Patient following 1 step directions inconsistently, with minimal task initiation. Patient require max A to try and wash her face semi-supine in bed. Unable to hold UEs against gravity, note some volitional movement of UEs in bed. Patient perseverating on wanting food/water despite redirection and reassurance speech therapy will evaluate her swallow. Patient total A for positioning trunk upright and elevating UEs on pillows due to edema R>L. Recommend continued acute OT services to maximize patient strength, activity tolerance, cognition in order to participate in self care and to facilitate discharge to venue listed below.    04/16/20 1100  OT Visit Information  Last OT Received On 04/16/20  Assistance Needed +2  History of Present Illness Pt admitted with acute encephalopathy and noted to be in acute liver failure in the setting of Alchoholic Hepatitis with profound coagulopathy and hematemesis.  Pt with hx of ETOH and polysubstance abuse as well as osteomylitis of L ankle  Precautions  Precautions Fall  Restrictions  Weight Bearing Restrictions No  Home Living  Family/patient expects to be discharged to: Private residence  Living Arrangements Other relatives (grandmother)  Additional Comments Ltd information from chart - pt unable to provide at this time  Prior Function  Comments Pt unable to provide information  Communication  Communication Other (comment) (minimal verbalization, hoarse voice)  Pain Assessment  Pain Assessment Faces  Faces Pain Scale 0  Cognition  Arousal/Alertness Lethargic  Behavior During Therapy Flat affect  Overall Cognitive Status No family/caregiver present to determine baseline cognitive functioning  General Comments when asked where she is patient states "there is too much going on, I can't be bothered" is perseverating on wanting  to eat/drink however made aware multiple times from RN/OT waiting for speech therapy. patient following 1 step commands inconsistently, limited sustained attention   Upper Extremity Assessment  Upper Extremity Assessment Generalized weakness;Difficult to assess due to impaired cognition (couldnt hold limbs against G, observe min. volitional mob )  Lower Extremity Assessment  Lower Extremity Assessment Generalized weakness;Defer to PT evaluation  ADL  Overall ADL's  Needs assistance/impaired  Grooming Maximal assistance;Bed level  Grooming Details (indicate cue type and reason) max A to support arm distally and place wash cloth in hand, patient tries to move her face towards hand instead of raising arm further, minimally able to wash mouth, nose.   Upper Body Bathing Total assistance  Lower Body Bathing Total assistance  Upper Body Dressing  Total assistance  Lower Body Dressing Total assistance  Toilet Transfer Details (indicate cue type and reason) unable to attempt due to safety concerns, patient keeping eyes open minimally   Toileting- Clothing Manipulation and Hygiene Total assistance  Functional mobility during ADLs Total assistance  General ADL Comments patient with significant weakness, edema R>L UE, impaired cognition, safety awareness impacting ability to participate in self care.   Bed Mobility  Overal bed mobility Needs Assistance  General bed mobility comments total A to reposition in bed due to trunk leaning R, elevated B UEs for edema management   Transfers  General transfer comment not tested for therapist/patient safety, x1 assist available and patient unable to keep eyes open or follow 1 step commands consistently   Exercises  Exercises General Upper Extremity  General Exercises - Upper Extremity  Shoulder Flexion PROM;Both;5 reps;Supine  Shoulder ABduction PROM;Both;5 reps;Supine  Elbow Flexion PROM;Both;5 reps;Supine  Elbow Extension PROM;Both;5 reps;Supine  Digit  Composite Flexion Both;AAROM;5  reps  Composite Extension PROM;Both;5 reps;Supine  OT - End of Session  Activity Tolerance Patient limited by lethargy  Patient left in bed;with call bell/phone within reach;with bed alarm set  Nurse Communication Other (comment) (participation with OT)  OT Assessment  OT Recommendation/Assessment Patient needs continued OT Services  OT Visit Diagnosis Other abnormalities of gait and mobility (R26.89);Muscle weakness (generalized) (M62.81);Other symptoms and signs involving cognitive function  OT Problem List Decreased strength;Decreased activity tolerance;Impaired balance (sitting and/or standing);Decreased cognition;Decreased safety awareness;Increased edema  OT Plan  OT Frequency (ACUTE ONLY) Min 2X/week  OT Treatment/Interventions (ACUTE ONLY) Self-care/ADL training;Therapeutic exercise;DME and/or AE instruction;Therapeutic activities;Cognitive remediation/compensation;Patient/family education;Balance training  AM-PAC OT "6 Clicks" Daily Activity Outcome Measure (Version 2)  Help from another person eating meals? 1 (current NPO)  Help from another person taking care of personal grooming? 2  Help from another person toileting, which includes using toliet, bedpan, or urinal? 1  Help from another person bathing (including washing, rinsing, drying)? 1  Help from another person to put on and taking off regular upper body clothing? 1  Help from another person to put on and taking off regular lower body clothing? 1  6 Click Score 7  OT Recommendation  Follow Up Recommendations SNF;Supervision/Assistance - 24 hour  OT Equipment  (defer next venue)  Individuals Consulted  Consulted and Agree with Results and Recommendations Patient  Acute Rehab OT Goals  Patient Stated Goal "I want water"  OT Goal Formulation With patient  Time For Goal Achievement 04/30/20  Potential to Achieve Goals Fair  OT Time Calculation  OT Start Time (ACUTE ONLY) 0920  OT Stop  Time (ACUTE ONLY) 0935  OT Time Calculation (min) 15 min  OT General Charges  $OT Visit 1 Visit  OT Evaluation  $OT Eval Low Complexity 1 Low  Written Expression  Dominant Hand  (would not state when prompted )   Marlyce Huge OT OT pager: [phone]

## 2020-04-16 NOTE — TOC Progression Note (Signed)
Transition of Care [facility]) - Progression Note    Patient Details  Name: Gina Hooper MRN: [id number] Date of Birth: Jan 31, 1991  [facility] CM/SW Contact  Golda Acre, RN Phone Number: 04/16/2020, 9:24 AM  Clinical Narrative:    Extubated on [phone] to room air,po Chronulac, temp 100.5 WCB 15.6, hbg 8.8 Hx of etoh and substance abuse is not a candidate for a liver transplant and is in liver failure. Following of progression and toc needs.    Expected Discharge Plan: Home/Self Care Barriers to Discharge: Continued Medical Work up  Expected Discharge Plan and Services Expected Discharge Plan: Home/Self Care In-house Referral: Financial Counselor Discharge Planning Services: CM Consult   Living arrangements for the past 2 months: Single Family Home                                       Social Determinants of Health (SDOH) Interventions    Readmission Risk Interventions No flowsheet data found.

## 2020-04-16 NOTE — Plan of Care (Signed)
TRH to assume care on 04/17/20.  Gina Hooper is a 29 yo female with PMH substance abuse, alcohol abuse, depression, anemia who initially presented to the hospital on 04/04/2020 with abdominal pain and nausea/vomiting.  She was initially found at home to be hypoglycemic.  After work-up in the ER she was found to have acute liver failure (creatinine of 1.2 (with a baseline of 0.6) Alk Phos 147, AST>10,000, ALT 4566, TB 4.8, INR 9.9).  GI was consulted on admission and she was initially treated with NAC, which was completed on 04/07/2020 and she was continued on Solu-Medrol.  She was also evaluated from a transplant standpoint and deemed not a candidate due to her underlying substance abuse and lack of insurance. She developed hematemesis on 04/05/2020 and due to her extreme coagulopathy, she was not felt to be a good endoscopy candidate and to high risk.  She was treated with PPI infusion. She developed progressively worsening mentation and became minimally responsive/encephalopathic requiring intubation on 04/06/2020.  She underwent MRI brain on 04/07/2020 which showed no acute abnormalities. Neurology was also consulted on 04/08/2020.  She underwent EEG on 04/09/2020 which showed severe diffuse encephalopathy of nonspecific etiology.  No seizure activity was appreciated. She remained intubated and unresponsive with no sedation. She continued on treatment on the vent and on 04/13/2020, she started exhibiting some cough/gag and triggering on the vent.  Over the next couple days she then started becoming a little more alert on the vent and was seen to be following simple commands and moving extremities.  Pupils became more reactive.  She was extubated on 04/15/2020.  She underwent SLP eval and was started on a nectar thick liquid diet on 04/16/2020. She will need aggressive PT/OT.    Hospital managed problems include: Acute Liver failure Acute alcoholic hepatitis UGIB with hematemesis Acute hypoxic respiratory  failure ABLA Severe coagulopathy Lactic acidosis Thrombocytopenia AKI Substance abuse Acute toxic metabolic hepatic encephalopathy Alcohol withdrawal Hypokalemia Hypophosphatemia Hypernatremia Moderate protein calorie malnutrition  Dwyane Dee, MD [facility] 04/16/2020, 5:00 PM

## 2020-04-16 NOTE — Progress Notes (Signed)
eLink Physician-Brief Progress Note [name] DOB: [birth year] MRN: [id number]   Date of Service  04/16/2020  HPI/Events of Note  Glucose in 70s  eICU Interventions  Switch LR to D5LR      Intervention Category Major Interventions: Other:  Oretha Milch 04/16/2020, 5:12 AM

## 2020-04-17 ENCOUNTER — Inpatient Hospital Stay (HOSPITAL_COMMUNITY): Payer: Medicaid Other

## 2020-04-17 DIAGNOSIS — K7201 Acute and subacute hepatic failure with coma: Secondary | ICD-10-CM | POA: Diagnosis not present

## 2020-04-17 DIAGNOSIS — J9601 Acute respiratory failure with hypoxia: Secondary | ICD-10-CM | POA: Diagnosis not present

## 2020-04-17 DIAGNOSIS — F1010 Alcohol abuse, uncomplicated: Secondary | ICD-10-CM | POA: Diagnosis not present

## 2020-04-17 DIAGNOSIS — D689 Coagulation defect, unspecified: Secondary | ICD-10-CM | POA: Diagnosis not present

## 2020-04-17 LAB — GLUCOSE, CAPILLARY
Glucose-Capillary: 108 mg/dL — ABNORMAL HIGH (ref 70–99)
Glucose-Capillary: 110 mg/dL — ABNORMAL HIGH (ref 70–99)
Glucose-Capillary: 151 mg/dL — ABNORMAL HIGH (ref 70–99)
Glucose-Capillary: 84 mg/dL (ref 70–99)
Glucose-Capillary: 91 mg/dL (ref 70–99)
Glucose-Capillary: 98 mg/dL (ref 70–99)

## 2020-04-17 LAB — COMPREHENSIVE METABOLIC PANEL
ALT: 314 U/L — ABNORMAL HIGH (ref 0–44)
AST: 276 U/L — ABNORMAL HIGH (ref 15–41)
Albumin: 1.9 g/dL — ABNORMAL LOW (ref 3.5–5.0)
Alkaline Phosphatase: 123 U/L (ref 38–126)
Anion gap: 7 (ref 5–15)
BUN: 21 mg/dL — ABNORMAL HIGH (ref 6–20)
CO2: 25 mmol/L (ref 22–32)
Calcium: 8.1 mg/dL — ABNORMAL LOW (ref 8.9–10.3)
Chloride: 118 mmol/L — ABNORMAL HIGH (ref 98–111)
Creatinine, Ser: 0.72 mg/dL (ref 0.44–1.00)
GFR calc Af Amer: >60 mL/min (ref >60)
GFR calc non Af Amer: >60 mL/min (ref >60)
Glucose, Bld: 115 mg/dL — ABNORMAL HIGH (ref 70–99)
Potassium: 3.5 mmol/L (ref 3.5–5.1)
Sodium: 150 mmol/L — ABNORMAL HIGH (ref 135–145)
Total Bilirubin: 23.7 mg/dL (ref 0.3–1.2)
Total Protein: 4.9 g/dL — ABNORMAL LOW (ref 6.5–8.1)

## 2020-04-17 LAB — PROTIME-INR
INR: 1.5 — ABNORMAL HIGH (ref 0.8–1.2)
Prothrombin Time: 17.4 seconds — ABNORMAL HIGH (ref 11.4–15.2)

## 2020-04-17 LAB — CBC
HCT: 24.4 % — ABNORMAL LOW (ref 36.0–46.0)
Hemoglobin: 7.7 g/dL — ABNORMAL LOW (ref 12.0–15.0)
MCH: 27.8 pg (ref 26.0–34.0)
MCHC: 31.6 g/dL (ref 30.0–36.0)
MCV: 88.1 fL (ref 80.0–100.0)
Platelets: 67 10*3/uL — ABNORMAL LOW (ref 150–400)
RBC: 2.77 MIL/uL — ABNORMAL LOW (ref 3.87–5.11)
RDW: 28.7 % — ABNORMAL HIGH (ref 11.5–15.5)
WBC: 12.8 10*3/uL — ABNORMAL HIGH (ref 4.0–10.5)
nRBC: 0.2 % (ref 0.0–0.2)

## 2020-04-17 LAB — PHOSPHORUS: Phosphorus: 3.4 mg/dL (ref 2.5–4.6)

## 2020-04-17 LAB — MAGNESIUM: Magnesium: 2.3 mg/dL (ref 1.7–2.4)

## 2020-04-17 LAB — LACTIC ACID, PLASMA: Lactic Acid, Venous: 1.2 mmol/L (ref 0.5–1.9)

## 2020-04-17 MED ORDER — DEXMEDETOMIDINE HCL IN NACL 200 MCG/50ML IV SOLN
0.4000 ug/kg/h | INTRAVENOUS | Status: DC
  Administered 2020-04-17: 0.4 ug/kg/h via INTRAVENOUS
  Filled 2020-04-17: qty 50

## 2020-04-17 NOTE — Progress Notes (Signed)
Patient becoming increasingly agitated and attempting to get out of bed over beginning of shift. RN attempted to redirect patient and reorient, but did not work. E-Link notified and orders received for precedex drip. Patient now resting comfortably at this time

## 2020-04-17 NOTE — Progress Notes (Signed)
PROGRESS NOTE  Gina Hooper ESP:[id number] DOB: 02-Aug-1991 DOA: 04/04/2020 PCP: Patient, No Pcp Per  HPI/Recap of past 24 hours: HPI from Dr Sabino Gasser Gina Hooper is a 29 yo female with PMH substance abuse, alcohol abuse, depression, anemia who initially presented to the hospital on 04/04/2020 with abdominal pain and nausea/vomiting.  She was initially found at home to be hypoglycemic. After work-up in the ER she was found to have acute liver failure (creatinine of 1.2 (with a baseline of 0.6) Alk Phos 147, AST>10,000, ALT 4566, TB 4.8, INR 9.9). GI was consulted on admission and she was initially treated with NAC, which was completed on 04/07/2020 and she was continued on Solu-Medrol. She was also evaluated from a transplant standpoint and deemed not a candidate due to her underlying substance abuse and lack of insurance. Pt developed hematemesis on 04/05/2020 and due to her extreme coagulopathy, she was felt to be high risk and poor endoscopy candidate. Pt was treated with PPI infusion. She developed progressively worsening mentation and became minimally responsive/encephalopathic requiring intubation on 04/06/2020.  She underwent MRI brain on 04/07/2020 which showed no acute abnormalities. Neurology was also consulted on 04/08/2020, EEG on 04/09/2020 which showed severe diffuse encephalopathy of nonspecific etiology.  No seizure activity was appreciated. She remained intubated and unresponsive with no sedation. Pt was extubated on 04/15/2020. TRH assumed care on 04/17/20.    Overnight, due to significant agitation, patient required Precedex drip.  This morning, patient still noted to be drowsy/sleepy.  Unable to answer any of my questions.     Assessment/Plan: Active Problems:   Hepatitis   Acute liver failure   Alcohol abuse   Thrombocytopenia (HCC)   Coagulopathy (HCC)   SIRS (systemic inflammatory response syndrome) (HCC)   Acute respiratory failure with hypoxia (HCC)   Polysubstance abuse  (HCC)   Acute liver failure in the setting of acute alcoholic hepatitis +/-possible Tylenol toxicity Possible SBP On admission MELD-Na score 40, significant transaminitis, with AST greater than 10,000, ALT over 4000, T bili 4.8, coagulopathy, discriminant factor score 304 Currently still with transaminitis, AST 276, ALT 314, T bili 23.7 Currently sleepy (Precedex overnight) Afebrile, with down trending leukocytosis Completed NAC 8/14, completed Solu-Medrol Continue IV Rocephin for possible SBP GI consulted, not a candidate for transplant per [facility] due to ongoing alcohol abuse with no insurance.  Currently GI signed off Trend CMP daily  Acute hepatic/metabolic encephalopathy Alcohol withdrawal MRI unremarkable on 8/13, EEG on 8/16 with diffuse encephalopathy Neurology consulted, signed off Continue lactulose, rifaximin Continue folic acid, multivitamin, thiamine  Hypernatremia Likely 2/2 poor oral intake Continue D5 W at 50 cc/h  Upper GI bleeding with hematemesis Possibly from gastritis GI consulted, poor endoscopic candidate Monitor CBC closely  Acute respiratory failure likely 2/2 hepatic encephalopathy/acute liver failure Intubated 8/13, extubated 8/22 Currently on room air Supplemental oxygen as needed, I-S, mobilize  Anemia of critical illness Thrombocytopenia likely 2/2 liver disease Daily CBC Transfuse if hemoglobin less than 7  Moderate malnutrition, deconditioning SLP on board, recommend dysphagia diet PT/OT        Malnutrition Type:  Nutrition Problem: Inadequate oral intake Etiology: inability to eat   Malnutrition Characteristics:  Signs/Symptoms: NPO status   Nutrition Interventions:  Interventions: Refer to RD note for recommendations    Estimated body mass index is 26.22 kg/m as calculated from the following:   Height as of this encounter: _0  (1.676 m).   Weight as of this encounter: 73.7 kg.     Code  Status: Full  Family  Communication: None at bedside  Disposition Plan: Status is: Inpatient  Remains inpatient appropriate because:Inpatient level of care appropriate due to severity of illness   Dispo: The patient is from: Home              Anticipated d/c is to: Home              Anticipated d/c date is: 3 days              Patient currently is not medically stable to d/c.    Consultants:  PCCM  GI  Neurology  Procedures:  Mechanical ventilation  Antimicrobials: Ceftriaxone  DVT prophylaxis: SCDs   Objective: Vitals:   04/17/20 0826 04/17/20 1000 04/17/20 1100 04/17/20 1201  BP:  116/75  (!) 154/95  Pulse:  81 93   Resp:  16 (!) 25   Temp: 99.8 F (37.7 C)   98.2 F (36.8 C)  TempSrc: Axillary   Oral  SpO2:  94% 99%   Weight:      Height:        Intake/Output Summary (Last 24 hours) at 04/17/2020 1441 Last data filed at 04/17/2020 1200 Gross per 24 hour  Intake 1646.89 ml  Output 900 ml  Net 746.89 ml   Filed Weights   04/11/20 0500 04/12/20 0500 04/17/20 0500  Weight: 68.2 kg 69.8 kg 73.7 kg    Exam:  General: NAD, sleepy, unable to answer any of my questions, jaundice with subconjunctival hemorrhage  Cardiovascular: S1, S2 present  Respiratory: CTAB  Abdomen: Soft, nontender, nondistended, bowel sounds present  Musculoskeletal: No bilateral pedal edema noted  Skin:  Jaundiced  Psychiatry:  Unable to assess    Data Reviewed: CBC: Recent Labs  Lab 04/13/20 0435 04/14/20 0647 04/15/20 0346 04/16/20 0452 04/17/20 0515  WBC 25.6* 18.6* 14.7* 15.6* 12.8*  HGB 10.0* 9.4* 9.1* 8.8* 7.7*  HCT 30.1* 29.0* 27.4* 27.0* 24.4*  MCV 82.0 82.4 82.8 84.6 88.1  PLT 87* 78* 74* 67* 67*   Basic Metabolic Panel: Recent Labs  Lab 04/10/20 1712 04/11/20 0500 04/14/20 0647 04/15/20 0346 04/15/20 1259 04/16/20 0452 04/17/20 0515  NA  --    < > 146* 149* 147* 147* 150*  K  --    < > 5.6* 3.1* 4.3 4.6 3.5  CL  --    < > 111 116* 115* 117* 118*  CO2  --    <  > _0 GLUCOSE  --    < > 158* 130* 104* 86 115*  BUN  --    < > 41* 31* 25* 22* 21*  CREATININE  --    < > <0.30* 0.59 <0.30* <0.30* 0.72  CALCIUM  --    < > 7.8* 8.1* 7.9* 7.9* 8.1*  MG 2.7*  --   --   --   --   --  2.3  PHOS 3.0  --   --   --   --   --  3.4   < > = values in this interval not displayed.   GFR: Estimated Creatinine Clearance: 106.6 mL/min (by C-G formula based on SCr of 0.72 mg/dL). Liver Function Tests: Recent Labs  Lab 04/13/20 0435 04/14/20 0647 04/15/20 0346 04/16/20 0452 04/17/20 0515  AST 189* 281* 291* 331* 276*  ALT 433* 410* 389* 365* 314*  ALKPHOS 123 123 118 131* 123  BILITOT 21.3* 22.6* 23.0* 23.7* 23.7*  PROT 5.4* 5.2* 5.6* 5.2*  4.9*  ALBUMIN 2.3* 2.3* 2.3* 2.1* 1.9*   No results for input(s): LIPASE, AMYLASE in the last 168 hours. Recent Labs  Lab 04/11/20 0500 04/12/20 0500 04/13/20 0435 04/16/20 1554  AMMONIA 60* 91* 33 24   Coagulation Profile: Recent Labs  Lab 04/12/20 0500 04/13/20 0435 04/14/20 0647 04/15/20 0346 04/17/20 0515  INR 2.2* 1.8* 1.6* 1.5* 1.5*   Cardiac Enzymes: No results for input(s): CKTOTAL, CKMB, CKMBINDEX, TROPONINI in the last 168 hours. BNP (last 3 results) No results for input(s): PROBNP in the last 8760 hours. HbA1C: No results for input(s): HGBA1C in the last 72 hours. CBG: Recent Labs  Lab 04/16/20 2002 04/16/20 2355 04/17/20 0411 04/17/20 0817 04/17/20 1153  GLUCAP 91 136* 110* 84 98   Lipid Profile: No results for input(s): CHOL, HDL, LDLCALC, TRIG, CHOLHDL, LDLDIRECT in the last 72 hours. Thyroid Function Tests: No results for input(s): TSH, T4TOTAL, FREET4, T3FREE, THYROIDAB in the last 72 hours. Anemia Panel: No results for input(s): VITAMINB12, FOLATE, FERRITIN, TIBC, IRON, RETICCTPCT in the last 72 hours. Urine analysis:    Component Value Date/Time   COLORURINE AMBER (A) 04/13/2020 1004   APPEARANCEUR CLEAR 04/13/2020 1004   LABSPEC 1.026 04/13/2020 1004    PHURINE 6.0 04/13/2020 1004   GLUCOSEU 50 (A) 04/13/2020 1004   HGBUR MODERATE (A) 04/13/2020 1004   BILIRUBINUR MODERATE (A) 04/13/2020 1004   KETONESUR NEGATIVE 04/13/2020 1004   PROTEINUR NEGATIVE 04/13/2020 1004   UROBILINOGEN 0.2 10/23/2011 1604   NITRITE NEGATIVE 04/13/2020 1004   LEUKOCYTESUR NEGATIVE 04/13/2020 1004   Sepsis Labs: _0 (procalcitonin:4,lacticidven:4)  )No results found for this or any previous visit (from the past 240 hour(s)).    Studies: US Abdomen Limited RUQ  Result Date: 04/16/2020 CLINICAL DATA:  Jaundice EXAM: ULTRASOUND ABDOMEN LIMITED RIGHT UPPER QUADRANT COMPARISON:  CT 04/04/2020 FINDINGS: Gallbladder: Gallbladder wall is thickened measuring 5 mm. No stones or sonographic Murphy sign. Common bile duct: Diameter: Normal caliber, 3 mm Liver: Heterogeneous, increased echotexture throughout the liver. Nodular contours of the liver surface. Portal vein is patent on color Doppler imaging with normal direction of blood flow towards the liver. Other: Perihepatic ascites noted. IMPRESSION: Heterogeneous, increased echotexture throughout the liver suggesting fatty infiltration. Nodular contours compatible with cirrhosis. Perihepatic ascites. Gallbladder wall thickening likely related to liver disease. No stones. Electronically Signed   By: Rolm Baptise M.D.   On: 04/16/2020 20:43    Scheduled Meds: . Chlorhexidine Gluconate Cloth  6 each Topical Daily  . folic acid  1 mg Intravenous Daily  . insulin aspart  0-6 Units Subcutaneous Q4H  . lactulose  30 g Per Tube Daily  . mouth rinse  15 mL Mouth Rinse BID  . multivitamin  15 mL Per Tube q1800  . pantoprazole (PROTONIX) IV  40 mg Intravenous Q24H  . rifaximin  550 mg Per Tube BID  . sodium chloride flush  10-40 mL Intracatheter Q12H  . thiamine  100 mg Per Tube Daily   Or  . thiamine  100 mg Intravenous Daily    Continuous Infusions: . cefTRIAXone (ROCEPHIN)  IV Stopped (04/17/20 1004)  .  dexmedetomidine (PRECEDEX) IV infusion Stopped (04/17/20 0405)  . dextrose 5% lactated ringers 50 mL/hr at 04/17/20 1200     LOS: 13 days     Alma Friendly, MD [facility]  If 7PM-7AM, please contact night-coverage [email or web address] 04/17/2020, 2:41 PM

## 2020-04-17 NOTE — Progress Notes (Signed)
MBS completed, full report to follow.  Rec dys3/thin WATER with meals with single sips and chin tuck posture.    Rolena Infante, MS [facility] SLP Acute [employer] [phone]

## 2020-04-17 NOTE — Progress Notes (Signed)
  Speech Language Pathology Treatment: Dysphagia  Patient Details Name: Gina Hooper MRN: [id number] DOB: Aug 25, 1991 Today's Date: 04/17/2020 Time: 8295-6213 SLP Time Calculation (min) (ACUTE ONLY): 10 min  Assessment / Plan / Recommendation Clinical Impression  In speaking with RN, she reports pt was coughing with intake last night even of thickened liquids.  Today, SLP visit to assess swallow for potential improvement, aspiration risk and indication for instrumental exam.  Pt remains dysphonic today with subjectively mild improvement.   Explosive coughing after swallowing sequential swallows of thin water, concerning for aspiration.  Pt with decreased awareness stating "I'm fine" after recovering and SLP educating her to need for MBS due to concern for acute dysphagia/aspiration due prolonged intubation.  Pt's dysphonia concerning for potential laryngeal/pharyngeal edema impacting swallowing musculature and s/s of aspiration deem MBS necessary.  Planned for today at approximately 1300.  RN, pt and MD made aware of plan.     HPI HPI: 29 yo female adm to [facility] with AMS alcoholic dependence and polysubstance abuse. She presented to the ED on 8/11 with abdominal pain and N/V. She was found to be in acute liver failure in the setting of alcoholic hepatitis with profound coagulopathy and hematemesis. CCM evaluated 8/12 due to declining mental status and concern about IV access. Pt was intubated x9 days- extubated yesterday am 04/15/2020. Swallow eval ordered.  Pt was started on a modified diet yesterday but per RN, she was coughing excessively with it and the trays were held.  Today pt seen for follow up, indication for instrumental swallow evaluation.      SLP Plan  MBS (today)       Recommendations  Medication Administration: Whole meds with puree                Follow up Recommendations: Skilled Nursing facility SLP Visit Diagnosis: Dysphagia, oropharyngeal phase (R13.12) Plan: MBS  (today)       GO                Chales Abrahams 04/17/2020, 11:36 AM Rolena Infante, MS [facility] SLP Acute Rehab Services Office [phone]

## 2020-04-17 NOTE — Progress Notes (Signed)
eLink Physician-Brief Progress Note [name] DOB: 1990/09/21 MRN: [id number]   Date of Service  04/17/2020  HPI/Events of Note  Patient agitated and confused (has been during the day as well). Trying to crawl out of bed. Severe liver issues. HR in 100s, stable bP  eICU Interventions  Try precedex     Intervention Category Major Interventions: Change in mental status - evaluation and management  Nas Wafer G Evin Loiseau 04/17/2020, 12:21 AM

## 2020-04-17 NOTE — Progress Notes (Signed)
Patient becoming increasingly agitated with RN over oral fluids. RN attempted to explain to patient that it is not safe for her to currently take orals since she is coughing after sips. Will continue to reorient patient

## 2020-04-17 NOTE — Progress Notes (Signed)
Modified Barium Swallow Progress Note  Patient Details  Name: Gina Hooper MRN: [id number] Date of Birth: 03/31/1991  Today's Date: 04/17/2020  Modified Barium Swallow completed.  Full report located under Chart Review in the Imaging Section.  Brief recommendations include the following:  Clinical Impression  Patient with acute dysphagia due to prolonged intubation causing impairment in timing of swallow reflex which allows aspiration and penetration of thin and nectar liquids - most notably when drinking sequential swallows.  Aspiration largely silent in nature without protective cough response.  Tight chin tuck posture with small single liquid bolus prevented aspiration/penetration.  Pt's swallow with puree/solids was functional with swallow triggering at vallecular space or pyriform sinus region.  Pt masticated tablet despite cues to swallow whole.  Anticipate her swallow to improve as potential edema from long term intubation abates. Aspiration was mild but pt did not clear aspirates with reflexive or cued coughing.  Recommend dys3/thin - WATER with meals with precautions.  Will follow up for dysphagia management.   Swallow Evaluation Recommendations       SLP Diet Recommendations: Dysphagia 3 (Mech soft) solids;Thin liquid (water with meals)   Liquid Administration via: Straw   Medication Administration: Whole meds with puree   Supervision: Full supervision/cueing for compensatory strategies   Compensations: Slow rate;Small sips/bites;Chin tuck;Use straw to facilitate chin tuck (tuck chin with liquids) Small single sips!     Postural Changes: Remain semi-upright after after feeds/meals (Comment);Seated upright at 90 degrees   Oral Care Recommendations: Oral care BID   Other Recommendations: Have oral suction available  Rolena Infante, MS [facility] SLP Acute Rehab Services Office [phone]   Chales Abrahams 04/17/2020,5:18 PM

## 2020-04-18 DIAGNOSIS — K7201 Acute and subacute hepatic failure with coma: Secondary | ICD-10-CM | POA: Diagnosis not present

## 2020-04-18 LAB — COMPREHENSIVE METABOLIC PANEL
ALT: 314 U/L — ABNORMAL HIGH (ref 0–44)
AST: 286 U/L — ABNORMAL HIGH (ref 15–41)
Albumin: 2.1 g/dL — ABNORMAL LOW (ref 3.5–5.0)
Alkaline Phosphatase: 149 U/L — ABNORMAL HIGH (ref 38–126)
Anion gap: 10 (ref 5–15)
BUN: 18 mg/dL (ref 6–20)
CO2: 21 mmol/L — ABNORMAL LOW (ref 22–32)
Calcium: 8 mg/dL — ABNORMAL LOW (ref 8.9–10.3)
Chloride: 111 mmol/L (ref 98–111)
Creatinine, Ser: 0.72 mg/dL (ref 0.44–1.00)
GFR calc Af Amer: >60 mL/min (ref >60)
GFR calc non Af Amer: >60 mL/min (ref >60)
Glucose, Bld: 109 mg/dL — ABNORMAL HIGH (ref 70–99)
Potassium: 3.1 mmol/L — ABNORMAL LOW (ref 3.5–5.1)
Sodium: 142 mmol/L (ref 135–145)
Total Bilirubin: 26.6 mg/dL (ref 0.3–1.2)
Total Protein: 5.3 g/dL — ABNORMAL LOW (ref 6.5–8.1)

## 2020-04-18 LAB — CBC WITH DIFFERENTIAL/PLATELET
Abs Immature Granulocytes: 0.47 10*3/uL — ABNORMAL HIGH (ref 0.00–0.07)
Basophils Absolute: 0 10*3/uL (ref 0.0–0.1)
Basophils Relative: 0 %
Eosinophils Absolute: 0.1 10*3/uL (ref 0.0–0.5)
Eosinophils Relative: 1 %
HCT: 25.2 % — ABNORMAL LOW (ref 36.0–46.0)
Hemoglobin: 8.3 g/dL — ABNORMAL LOW (ref 12.0–15.0)
Immature Granulocytes: 3 %
Lymphocytes Relative: 11 %
Lymphs Abs: 1.6 10*3/uL (ref 0.7–4.0)
MCH: 28.2 pg (ref 26.0–34.0)
MCHC: 32.9 g/dL (ref 30.0–36.0)
MCV: 85.7 fL (ref 80.0–100.0)
Monocytes Absolute: 1 10*3/uL (ref 0.1–1.0)
Monocytes Relative: 6 %
Neutro Abs: 12.2 10*3/uL — ABNORMAL HIGH (ref 1.7–7.7)
Neutrophils Relative %: 79 %
Platelets: 74 10*3/uL — ABNORMAL LOW (ref 150–400)
RBC: 2.94 MIL/uL — ABNORMAL LOW (ref 3.87–5.11)
RDW: 30.4 % — ABNORMAL HIGH (ref 11.5–15.5)
WBC: 15.5 10*3/uL — ABNORMAL HIGH (ref 4.0–10.5)
nRBC: 0.2 % (ref 0.0–0.2)

## 2020-04-18 LAB — GLUCOSE, CAPILLARY
Glucose-Capillary: 89 mg/dL (ref 70–99)
Glucose-Capillary: 81 mg/dL (ref 70–99)
Glucose-Capillary: 183 mg/dL — ABNORMAL HIGH (ref 70–99)
Glucose-Capillary: 86 mg/dL (ref 70–99)
Glucose-Capillary: 275 mg/dL — ABNORMAL HIGH (ref 70–99)
Glucose-Capillary: 91 mg/dL (ref 70–99)

## 2020-04-18 LAB — PHOSPHORUS: Phosphorus: 3.4 mg/dL (ref 2.5–4.6)

## 2020-04-18 MED ORDER — POTASSIUM CHLORIDE 10 MEQ/100ML IV SOLN
10.0000 meq | INTRAVENOUS | Status: AC
  Administered 2020-04-18 (×3): 10 meq via INTRAVENOUS
  Filled 2020-04-18 (×3): qty 100

## 2020-04-18 MED ORDER — TRAMADOL HCL 50 MG PO TABS
50.0000 mg | ORAL_TABLET | Freq: Two times a day (BID) | ORAL | Status: DC | PRN
  Administered 2020-04-21 – 2020-04-30 (×7): 50 mg via ORAL
  Filled 2020-04-18 (×8): qty 1

## 2020-04-18 MED ORDER — SODIUM CHLORIDE 0.9% FLUSH: 10.0000 mL | INTRAVENOUS | Status: DC | PRN

## 2020-04-18 MED ORDER — SODIUM CHLORIDE 0.9% FLUSH
10.0000 mL | Freq: Two times a day (BID) | INTRAVENOUS | Status: DC
  Administered 2020-04-19 – 2020-05-01 (×9): 10 mL

## 2020-04-18 MED ORDER — LACTATED RINGERS IV BOLUS
500.0000 mL | Freq: Once | INTRAVENOUS | Status: AC
  Administered 2020-04-18: 500 mL via INTRAVENOUS

## 2020-04-18 MED ORDER — RIFAXIMIN 550 MG PO TABS
550.0000 mg | ORAL_TABLET | Freq: Two times a day (BID) | ORAL | Status: DC
  Administered 2020-04-19 – 2020-05-01 (×24): 550 mg via ORAL
  Filled 2020-04-18 (×26): qty 1
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
## 2020-04-18 NOTE — Progress Notes (Signed)
  Speech Language Pathology Treatment: Dysphagia  Patient Details Name: Gina Hooper MRN: [id number] DOB: June 24, 1991 Today's Date: 04/18/2020 Time: 3419-3790 SLP Time Calculation (min) (ACUTE ONLY): 20 min  Assessment / Plan / Recommendation Clinical Impression  Pt's meal tray at bedside upon SLP entrance to room.  She was willing to consume some lunch despite discomfort reported. SLP reviewed MBS results with her from day before including need to tuck her chin with liquids and drink water with meals.  Pt accepting of few boluses of water, grilled cheese and fruit.  Reflexive cough apparent after intake of water via straw despite chin tuck posture and with fruit with moisture - pt requires max verbal and visual cues for chin tuck initially fading to mod by the end of the session. Decreasing bolus size to tsp amount with chin tuck eliminated coughing.    Pt also with increased work of breathing with "Noisy breathing" during intake.  She admits she gets "worked up" when she is upset therefore suspect a behavioral component to her increased WOB.  Poor intake noted with meal and pt declined to consume more stating she was in too much pain to eat.  SLP advised RN that pt requested pain medicine. SLP offered to obtain Ensure for pt to try given nutrition concerns but she declined.    Recommend thin liquids be provided via tsp only with chin tuck posture.  Will follow up for dysphagia management and advise RMST *respiratory muscle strength training * to improve adequacy of laryngeal closure.  RN reports pt with approx 30% intake of breakfast with some coughing most notably with liquids.  Concern for aspiration of thin via straw noted.  Informed pt to recommendations to which she verbalized "I'm ok" indicating decreased understanding or apathy.  Pt is demonstrating progression as she is consuming some intake but voice subjectively continues to be hoarse.   Hopefully pt will continue to tolerate po diet and  swallowing function will improve as possible edema from prolonged intubation abates.    HPI HPI: 29 yo female adm to [facility] with AMS alcoholic dependence and polysubstance abuse. She presented to the ED on 8/11 with abdominal pain and N/V. She was found to be in acute liver failure in the setting of alcoholic hepatitis with profound coagulopathy and hematemesis. CCM evaluated 8/12 due to declining mental status and concern about IV access. Pt was intubated x9 days- extubated yesterday am 04/15/2020. Swallow eval ordered.      SLP Plan  Continue with current plan of care       Recommendations  Diet recommendations: Dysphagia 3 (mechanical soft);Thin liquid Liquids provided via: Teaspoon Medication Administration: Whole meds with puree Supervision: Full supervision/cueing for compensatory strategies Compensations: Slow rate;Small sips/bites;Chin tuck (tuck chin with liquids) Postural Changes and/or Swallow Maneuvers: Seated upright 90 degrees;Upright 30-60 min after meal                Follow up Recommendations: Skilled Nursing facility;Home health SLP SLP Visit Diagnosis: Dysphagia, oropharyngeal phase (R13.12) Plan: Continue with current plan of care       GO                Gina Hooper 04/18/2020, 2:11 PM  Gina Infante, MS [facility] SLP Acute Rehab Services Office [phone]

## 2020-04-19 DIAGNOSIS — K7201 Acute and subacute hepatic failure with coma: Secondary | ICD-10-CM | POA: Diagnosis not present

## 2020-04-19 LAB — COMPREHENSIVE METABOLIC PANEL
ALT: 268 U/L — ABNORMAL HIGH (ref 0–44)
AST: 257 U/L — ABNORMAL HIGH (ref 15–41)
Albumin: 1.8 g/dL — ABNORMAL LOW (ref 3.5–5.0)
Alkaline Phosphatase: 158 U/L — ABNORMAL HIGH (ref 38–126)
Anion gap: 8 (ref 5–15)
BUN: 14 mg/dL (ref 6–20)
CO2: 19 mmol/L — ABNORMAL LOW (ref 22–32)
Calcium: 7.8 mg/dL — ABNORMAL LOW (ref 8.9–10.3)
Chloride: 110 mmol/L (ref 98–111)
Creatinine, Ser: <0.3 mg/dL — ABNORMAL LOW (ref 0.44–1.00)
Glucose, Bld: 97 mg/dL (ref 70–99)
Potassium: 3.6 mmol/L (ref 3.5–5.1)
Sodium: 137 mmol/L (ref 135–145)
Total Bilirubin: 25.8 mg/dL (ref 0.3–1.2)
Total Protein: 5.1 g/dL — ABNORMAL LOW (ref 6.5–8.1)

## 2020-04-19 LAB — CBC WITH DIFFERENTIAL/PLATELET
Abs Immature Granulocytes: 0.31 10*3/uL — ABNORMAL HIGH (ref 0.00–0.07)
Basophils Absolute: 0 10*3/uL (ref 0.0–0.1)
Basophils Relative: 0 %
Eosinophils Absolute: 0.1 10*3/uL (ref 0.0–0.5)
Eosinophils Relative: 1 %
HCT: 24.6 % — ABNORMAL LOW (ref 36.0–46.0)
Hemoglobin: 8.4 g/dL — ABNORMAL LOW (ref 12.0–15.0)
Immature Granulocytes: 2 %
Lymphocytes Relative: 12 %
Lymphs Abs: 1.7 10*3/uL (ref 0.7–4.0)
MCH: 28.7 pg (ref 26.0–34.0)
MCHC: 34.1 g/dL (ref 30.0–36.0)
MCV: 84 fL (ref 80.0–100.0)
Monocytes Absolute: 0.9 10*3/uL (ref 0.1–1.0)
Monocytes Relative: 6 %
Neutro Abs: 11.8 10*3/uL — ABNORMAL HIGH (ref 1.7–7.7)
Neutrophils Relative %: 79 %
Platelets: 73 10*3/uL — ABNORMAL LOW (ref 150–400)
RBC: 2.93 MIL/uL — ABNORMAL LOW (ref 3.87–5.11)
RDW: 31.3 % — ABNORMAL HIGH (ref 11.5–15.5)
WBC: 14.9 10*3/uL — ABNORMAL HIGH (ref 4.0–10.5)
nRBC: 0.2 % (ref 0.0–0.2)

## 2020-04-19 LAB — GLUCOSE, CAPILLARY
Glucose-Capillary: 87 mg/dL (ref 70–99)
Glucose-Capillary: 94 mg/dL (ref 70–99)
Glucose-Capillary: 117 mg/dL — ABNORMAL HIGH (ref 70–99)
Glucose-Capillary: 92 mg/dL (ref 70–99)
Glucose-Capillary: 129 mg/dL — ABNORMAL HIGH (ref 70–99)

## 2020-04-19 LAB — PHOSPHORUS: Phosphorus: 3.4 mg/dL (ref 2.5–4.6)

## 2020-04-19 MED ORDER — FOLIC ACID 1 MG PO TABS: 1.0000 mg | ORAL_TABLET | Freq: Every day | ORAL | Status: DC

## 2020-04-19 MED ORDER — FOLIC ACID 5 MG/ML IJ SOLN
1.0000 mg | Freq: Every day | INTRAMUSCULAR | Status: DC
  Administered 2020-04-19 – 2020-04-23 (×5): 1 mg via INTRAVENOUS
  Filled 2020-04-19 (×5): qty 0.2

## 2020-04-19 NOTE — TOC Progression Note (Signed)
Transition of Care [facility]) - Progression Note    Patient Details  Name: Gina Hooper MRN: [id number] Date of Birth: [birth year]  [facility] CM/SW Contact  Golda Acre, RN Phone Number: 04/19/2020, 9:14 AM  Clinical Narrative:    Continues to go through etoh withdrawal-iv precedex on board,Iv rocephin, liver enz remain elevated, total bili=25.8, remains in acute liver failure, po chronulac. Following for progression and toc needs. Plan is home with family and self care once withdrawal from etoh is passed.  Expected Discharge Plan: Home/Self Care Barriers to Discharge: Continued Medical Work up  Expected Discharge Plan and Services Expected Discharge Plan: Home/Self Care In-house Referral: Financial Counselor Discharge Planning Services: CM Consult   Living arrangements for the past 2 months: Single Family Home                                       Social Determinants of Health (SDOH) Interventions    Readmission Risk Interventions No flowsheet data found.

## 2020-04-19 NOTE — Progress Notes (Signed)
Physical Therapy Treatment Patient Details Name: Gina Hooper MRN: [id number] DOB: 1990/11/26 Today's Date: 04/19/2020    History of Present Illness Pt admitted with acute encephalopathy and noted to be in acute liver failure in the setting of Alchoholic Hepatitis with profound coagulopathy and hematemesis.  Pt with hx of ETOH and polysubstance abuse as well as osteomylitis of L ankle   Significant Hospital Events: 8/12 [facility] course complicated by hematemesis due to coagulopathy 8/13 - Patient developed progressive worsening mentation and became minimally responsive encephalopathic needing intubation  8/14  - MRI no acute finding, seen by neurology, EEG 8/16 severe diffuse encephalopathy, no seizure 8/22 - extubated  8/23 - Pt was placed on Precedex overnight due to significant agitation and off  8/24 night. 8/24 - transferred to [facility]     PT Comments    Patient making gradual progress with acute PT. She required Mod assist for lower trunk rolling and Max assist for upper trunk rolling in bed as well as cues to initiate UE reaching to bed rail. Patient required Max Assist to sit up to EOB, and min-mod assist to maintain balance. Pt completed 1x sit<>stand with RW and mod assist +2 to steady. She initiated side stepping at EOB and required mod assist to prevent LOB and cues for Rt LE stepping. Pt limited by fatigue and requested to return to bed. Max assist to return to supine and reposition. Acute PT will continue to progress pt as able. Discharge recommendations remain appropriate at this time.   Follow Up Recommendations  SNF (TBA as pt progresses)     Equipment Recommendations  None recommended by PT (TBA as pt progresses)    Recommendations for Other Services OT consult     Precautions / Restrictions Precautions Precautions: Fall Restrictions Weight Bearing Restrictions: No    Mobility  Bed Mobility Overal bed mobility: Needs Assistance Bed Mobility: Sit to  Supine;Rolling;Sidelying to Sit Rolling: Mod assist;Max assist;+2 for safety/equipment Sidelying to sit: Mod assist;+2 for safety/equipment;HOB elevated;Max assist   Sit to supine: Max assist;+2 for physical assistance;+2 for safety/equipment   General bed mobility comments: Mod assist for rolling in bed and to initaite lower trunk rolling, Max assist to complete upper trunk rolling and initaite reach for bed rail with UE's. Mod assist to bring LE's off EOB in sidelying and Max to raise trunk upright to sit EOB. Max assist to scoot forward to place feet on floor. Max +2 for return to supine to control trunk lowering and raise LE's up into bed.  Transfers Overall transfer level: Needs assistance Equipment used: Rolling walker (2 wheeled) Transfers: Sit to/from Stand Sit to Stand: Mod assist;+2 physical assistance;+2 safety/equipment         General transfer comment: Mod asisst +2 for power up and to steady in standing. Pt with anterior lean and requried cues for posture and to activation hip extensors.   Ambulation/Gait Ambulation/Gait assistance: Mod assist;+2 safety/equipment;+2 physical assistance Gait Distance (Feet): 2 Feet Assistive device: Rolling walker (2 wheeled) Gait Pattern/deviations: Step-to pattern Gait velocity: decr   General Gait Details: pre-gait activity with side stepping at EOB. pt required mod-max assist to maintain balance and multimodal cues for sequencing steps, pt withgreat difficulty for Rt LE stepping due to poor coordination.   Stairs             Wheelchair Mobility    Modified Rankin (Stroke Patients Only)       Balance Overall balance assessment: Needs assistance Sitting-balance support: Bilateral upper  extremity supported;Feet supported Sitting balance-Leahy Scale: Poor Sitting balance - Comments: min-mod assist throughout to steady at EOB, pt uable to maintain balance without UE support   Standing balance support: Bilateral upper  extremity supported Standing balance-Leahy Scale: Poor Standing balance comment: pt reliant on external support to stand statically.             Cognition Arousal/Alertness: Awake/alert Behavior During Therapy: Restless Overall Cognitive Status: Impaired/Different from baseline Area of Impairment: Orientation;Following commands;Safety/judgement;Problem solving          Orientation Level: Disoriented to;Situation     Following Commands: Follows one step commands consistently;Follows one step commands with increased time;Follows multi-step commands inconsistently Safety/Judgement: Decreased awareness of safety   Problem Solving: Decreased initiation;Requires verbal cues;Requires tactile cues General Comments: pt oriented to self, date, place, unclear on orientation to situation. patient wtih restless and depressed demeanor during session. pt required significant cues and extra time to complete tasks.      Exercises      General Comments General comments (skin integrity, edema, etc.): RR in 20's up to 30 during session, HR in 110's-120's. SpO2 at 100% at EOS on RA, poor read during session.      Pertinent Vitals/Pain Pain Assessment: Faces Faces Pain Scale: Hurts little more Pain Location: hips, back, general discomfort Pain Descriptors / Indicators: Grimacing;Guarding;Restless Pain Intervention(s): Limited activity within patient's tolerance;Monitored during session;Repositioned    Home Living Family/patient expects to be discharged to:: Private residence Living Arrangements: Other relatives Available Help at Discharge: Family (grandmother)         Home Equipment: None      Prior Function Level of Independence: Independent          PT Goals (current goals can now be found in the care plan section) Acute Rehab PT Goals PT Goal Formulation: Patient unable to participate in goal setting Time For Goal Achievement: 04/29/20 Potential to Achieve Goals:  Fair Progress towards PT goals: Progressing toward goals    Frequency    Min 3X/week      PT Plan Current plan remains appropriate       AM-PAC PT "6 Clicks" Mobility   Outcome Measure  Help needed turning from your back to your side while in a flat bed without using bedrails?: A Lot Help needed moving from lying on your back to sitting on the side of a flat bed without using bedrails?: A Lot Help needed moving to and from a bed to a chair (including a wheelchair)?: Total Help needed standing up from a chair using your arms (e.g., wheelchair or bedside chair)?: A Lot Help needed to walk in hospital room?: Total Help needed climbing 3-5 steps with a railing? : Total 6 Click Score: 9    End of Session Equipment Utilized During Treatment: Gait belt Activity Tolerance: Patient limited by fatigue Patient left: in bed;with call bell/phone within reach;with bed alarm set Nurse Communication: Mobility status PT Visit Diagnosis: Unsteadiness on feet (R26.81);Muscle weakness (generalized) (M62.81);Difficulty in walking, not elsewhere classified (R26.2)     Time: 0240-9735 PT Time Calculation (min) (ACUTE ONLY): 25 min  Charges:  $Therapeutic Activity: 23-37 mins                     Wynn Maudlin, DPT Acute Rehabilitation Services  Office [phone] Pager [phone]  04/19/2020 1:17 PM
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
## 2020-04-19 NOTE — Progress Notes (Addendum)
  Speech Language Pathology Treatment: Dysphagia  Patient Details Name: Gina Hooper MRN: [id number] DOB: 03-Jun-1991 Today's Date: 04/19/2020 Time: [id number] SLP Time Calculation (min) (ACUTE ONLY): 23 min  Assessment / Plan / Recommendation Clinical Impression  Pt today seen to assure tolerance of po diet given performance yesterday.  Pt with much subjectively improved mentation and voicing today compared to yesterday.  Observed her with consumption of [place] Cola, water, graham cracker and applesauce.  She was conducting chin tuck posture inconsistently.  No overt indication of aspiration with po, subtle throat clearing after 2/7 liquid boluses.    Pt required moderate verbal cues to conduct chin tuck posture adequately.   She did not pass the 3 ounce Yale water test however as she required rest break but she did not cough after.  She does continue to demonstrates mildly increased dyspnea with intake and thus recommend continue dys3/thin.    Recommend to continue chin tuck posture for maximal airway protection.  She will benefit from initiating RMST to improve phonatory strength.       HPI HPI: 29 yo female adm to [facility] with AMS alcoholic dependence and polysubstance abuse. She presented to the ED on 8/11 with abdominal pain and N/V. She was found to be in acute liver failure in the setting of alcoholic hepatitis with profound coagulopathy and hematemesis. CCM evaluated 8/12 due to declining mental status and concern about IV access. Pt was intubated x9 days- extubated yesterday am 04/15/2020. Swallow eval ordered.  Pt was started on a modified diet yesterday but per RN, she was coughing excessively with it and the trays were held.  Today pt seen for follow up, indication for instrumental swallow evaluation.      SLP Plan  Continue with current plan of care       Recommendations  Diet recommendations: Dysphagia 3 (mechanical soft);Thin liquid Liquids provided via: Teaspoon Medication  Administration: Whole meds with puree Supervision: Full supervision/cueing for compensatory strategies Compensations: Slow rate;Small sips/bites;Chin tuck (tuck chin with liquids) Postural Changes and/or Swallow Maneuvers: Seated upright 90 degrees;Upright 30-60 min after meal                Follow up Recommendations: Skilled Nursing facility;Home health SLP SLP Visit Diagnosis: Dysphagia, oropharyngeal phase (R13.12) Plan: Continue with current plan of care       GO              Rolena Infante, MS [facility] SLP Acute Rehab Services Office [phone]   Chales Abrahams 04/19/2020, 2:36 PM

## 2020-04-19 NOTE — Progress Notes (Signed)
Occupational Therapy Treatment Patient Details Name: Gina Hooper MRN: [id number] DOB: 11/01/1990 Today's Date: 04/19/2020    History of present illness Pt admitted with acute encephalopathy and noted to be in acute liver failure in the setting of Alchoholic Hepatitis with profound coagulopathy and hematemesis.  Pt with hx of ETOH and polysubstance abuse as well as osteomylitis of L ankle   OT comments  Patient's cognition improved today. Patient able to answer questions appropriately and perform simple commands. Patient mod assist to transfer to side of bed. Patient exhibits increased respirations at side of bed and reports dizziness and weakness but vitals stable. Patient stood with min assist but only able to maintain standing approx 5 seconds before sitting. Patient reporting increased dizziness and muscle weakness and states "my muscles are shaking." Patient returned to bed at her request. Supine BP 121/84. Therapist encouraged patient to perform movement in bed to maintain her ROM and strength and reduce edema in upper extremities.    Follow Up Recommendations  SNF;Supervision/Assistance - 24 hour    Equipment Recommendations  Other (comment) (tbd)    Recommendations for Other Services      Precautions / Restrictions Precautions Precautions: Fall Restrictions Weight Bearing Restrictions: No       Mobility Bed Mobility Overal bed mobility: Needs Assistance Bed Mobility: Supine to Sit Rolling: Mod assist;Max assist;+2 for safety/equipment Sidelying to sit: Mod assist;+2 for safety/equipment;HOB elevated;Max assist Supine to sit: Mod assist Sit to supine: Mod assist   General bed mobility comments: Patient transferred to side of bed needing assistance to negotiate legs to side of bed and trunk lift off. On returned to supine patient needed mod assist for both LEs.  Transfers Overall transfer level: Needs assistance Equipment used: Rolling walker (2 wheeled) Transfers:  Sit to/from Stand Sit to Stand: Min assist         General transfer comment: MIn assist to come into standing but could not maintain standing position or erect standing - reporting weakness and her muscles trembling.    Balance Overall balance assessment: Needs assistance Sitting-balance support: No upper extremity supported;Feet supported Sitting balance-Leahy Scale: Fair Sitting balance - Comments: min-mod assist throughout to steady at EOB, pt uable to maintain balance without UE support   Standing balance support: During functional activity;Bilateral upper extremity supported Standing balance-Leahy Scale: Poor Standing balance comment: reliant on walker with standing.                           ADL either performed or assessed with clinical judgement   ADL                                               Vision   Vision Assessment?: No apparent visual deficits   Perception     Praxis      Cognition Arousal/Alertness: Awake/alert Behavior During Therapy: WFL for tasks assessed/performed Overall Cognitive Status: Within Functional Limits for tasks assessed Area of Impairment: Orientation;Following commands;Safety/judgement;Problem solving                 Orientation Level: Disoriented to;Situation     Following Commands: Follows one step commands consistently;Follows one step commands with increased time;Follows multi-step commands inconsistently Safety/Judgement: Decreased awareness of safety   Problem Solving: Decreased initiation;Requires verbal cues;Requires tactile cues General Comments: pt oriented to self, date, place,  unclear on orientation to situation. patient wtih restless and depressed demeanor during session. pt required significant cues and extra time to complete tasks.        Exercises     Shoulder Instructions       General Comments RR in 20's up to 30 during session, HR in 110's-120's. SpO2 at 100% at EOS on  RA, poor read during session.    Pertinent Vitals/ Pain       Pain Assessment: No/denies pain Faces Pain Scale: Hurts little more Pain Location: hips, back, general discomfort Pain Descriptors / Indicators: Grimacing;Guarding;Restless Pain Intervention(s): Limited activity within patient's tolerance;Monitored during session;Repositioned  Home Living Family/patient expects to be discharged to:: Private residence Living Arrangements: Other relatives Available Help at Discharge: Family (grandmother)                         Home Equipment: None          Prior Functioning/Environment Level of Independence: Independent            Frequency  Min 2X/week        Progress Toward Goals  OT Goals(current goals can now be found in the care plan section)  Progress towards OT goals: Progressing toward goals  Acute Rehab OT Goals Patient Stated Goal: to get to chait OT Goal Formulation: With patient Time For Goal Achievement: 04/30/20 Potential to Achieve Goals: Good  Plan Discharge plan remains appropriate    Co-evaluation                 AM-PAC OT "6 Clicks" Daily Activity     Outcome Measure   Help from another person eating meals?: A Little Help from another person taking care of personal grooming?: A Lot Help from another person toileting, which includes using toliet, bedpan, or urinal?: Total Help from another person bathing (including washing, rinsing, drying)?: A Lot Help from another person to put on and taking off regular upper body clothing?: A Lot Help from another person to put on and taking off regular lower body clothing?: Total 6 Click Score: 11    End of Session Equipment Utilized During Treatment: Gait belt;Rolling walker  OT Visit Diagnosis: Other abnormalities of gait and mobility (R26.89);Muscle weakness (generalized) (M62.81);Other symptoms and signs involving cognitive function   Activity Tolerance Patient limited by fatigue    Patient Left in bed;with call bell/phone within reach;with bed alarm set;with SCD's reapplied   Nurse Communication Mobility status        Time: 7322-0254 OT Time Calculation (min): 21 min  Charges: OT General Charges $OT Visit: 1 Visit OT Treatments $Therapeutic Activity: 8-22 mins  Gina Hooper Session, OTR/L Acute Care Rehab Services  Office [phone] Pager: [phone]    Kelli Churn 04/19/2020, 1:29 PM

## 2020-04-19 NOTE — Progress Notes (Signed)
  Speech Language Pathology Treatment: Dysphagia  Patient Details Name: Gina Hooper MRN: [id number] DOB: 09-19-1990 Today's Date: 04/19/2020 Time: 4098-1191 SLP Time Calculation (min) (ACUTE ONLY): 12 min  Assessment / Plan / Recommendation Clinical Impression  Initiated RMST for expiratory muscle strength to improve phonation/laryngeal closure for speech and swallowing.  Pt agreeable to initiate exercises.  Provided [employer] Threshold PEP - threshold set at 11 cmH20 pressure clinically based on pt performance.  Pt intially required min verbal/visual cues to perform adequately but quickly was able to comprehend task and conduct with min verbal cues to allow adequate time between repetitions to prevent hyperventilation.  Pt reports she can "tell it's helping her lungs".  Advised pt to conduct exercies 10 repetitions, TID *not soon after meals* throughout the weekend and provided her with paperwork to track her performance. Using teach back, pt able to verbalize use of RMST device and precautions.    Will follow up on Monday for dysphagia/phonatory treatment.  Pt is motivated this afternoon demonstrating great participation in tasks.    HPI HPI: 29 yo female adm to [facility] with AMS alcoholic dependence and polysubstance abuse. She presented to the ED on 8/11 with abdominal pain and N/V. She was found to be in acute liver failure in the setting of alcoholic hepatitis with profound coagulopathy and hematemesis. CCM evaluated 8/12 due to declining mental status and concern about IV access. Pt was intubated x9 days- extubated yesterday am 04/15/2020. Swallow eval ordered.  Pt was started on a modified diet yesterday but per RN, she was coughing excessively with it and the trays were held.  Today pt seen for follow up, indication for instrumental swallow evaluation.      SLP Plan  Continue with current plan of care       Recommendations  Diet recommendations: Dysphagia 3 (mechanical  soft);Thin liquid Liquids provided via: Straw;Cup Medication Administration: Whole meds with puree Supervision: Patient able to self feed Compensations: Slow rate;Small sips/bites;Chin tuck (tuck chin with liquids) Postural Changes and/or Swallow Maneuvers: Seated upright 90 degrees;Upright 30-60 min after meal                Oral Care Recommendations: Oral care QID Follow up Recommendations: Skilled Nursing facility;Home health SLP SLP Visit Diagnosis: Dysphagia, oropharyngeal phase (R13.12) Plan: Continue with current plan of care       GO                Chales Abrahams 04/19/2020, 6:38 PM  Rolena Infante, MS [facility] SLP Acute Rehab Services Office [phone]

## 2020-04-20 DIAGNOSIS — K7201 Acute and subacute hepatic failure with coma: Secondary | ICD-10-CM | POA: Diagnosis not present

## 2020-04-20 LAB — COMPREHENSIVE METABOLIC PANEL
ALT: 244 U/L — ABNORMAL HIGH (ref 0–44)
AST: 196 U/L — ABNORMAL HIGH (ref 15–41)
Albumin: 1.8 g/dL — ABNORMAL LOW (ref 3.5–5.0)
Alkaline Phosphatase: 190 U/L — ABNORMAL HIGH (ref 38–126)
Anion gap: 7 (ref 5–15)
BUN: 12 mg/dL (ref 6–20)
CO2: 19 mmol/L — ABNORMAL LOW (ref 22–32)
Calcium: 7.9 mg/dL — ABNORMAL LOW (ref 8.9–10.3)
Chloride: 109 mmol/L (ref 98–111)
Creatinine, Ser: 0.41 mg/dL — ABNORMAL LOW (ref 0.44–1.00)
GFR calc Af Amer: >60 mL/min (ref >60)
GFR calc non Af Amer: >60 mL/min (ref >60)
Glucose, Bld: 98 mg/dL (ref 70–99)
Potassium: 3.2 mmol/L — ABNORMAL LOW (ref 3.5–5.1)
Sodium: 135 mmol/L (ref 135–145)
Total Bilirubin: 28.3 mg/dL (ref 0.3–1.2)
Total Protein: 5.5 g/dL — ABNORMAL LOW (ref 6.5–8.1)

## 2020-04-20 LAB — GLUCOSE, CAPILLARY
Glucose-Capillary: 112 mg/dL — ABNORMAL HIGH (ref 70–99)
Glucose-Capillary: 136 mg/dL — ABNORMAL HIGH (ref 70–99)
Glucose-Capillary: 163 mg/dL — ABNORMAL HIGH (ref 70–99)
Glucose-Capillary: 81 mg/dL (ref 70–99)
Glucose-Capillary: 93 mg/dL (ref 70–99)
Glucose-Capillary: 97 mg/dL (ref 70–99)

## 2020-04-20 LAB — CBC WITH DIFFERENTIAL/PLATELET
Abs Immature Granulocytes: 0.27 10*3/uL — ABNORMAL HIGH (ref 0.00–0.07)
Basophils Absolute: 0 10*3/uL (ref 0.0–0.1)
Basophils Relative: 0 %
Eosinophils Absolute: 0.1 10*3/uL (ref 0.0–0.5)
Eosinophils Relative: 1 %
HCT: 26.1 % — ABNORMAL LOW (ref 36.0–46.0)
Hemoglobin: 8.6 g/dL — ABNORMAL LOW (ref 12.0–15.0)
Immature Granulocytes: 2 %
Lymphocytes Relative: 11 %
Lymphs Abs: 1.5 10*3/uL (ref 0.7–4.0)
MCH: 28.8 pg (ref 26.0–34.0)
MCHC: 33 g/dL (ref 30.0–36.0)
MCV: 87.3 fL (ref 80.0–100.0)
Monocytes Absolute: 0.8 10*3/uL (ref 0.1–1.0)
Monocytes Relative: 6 %
Neutro Abs: 11.6 10*3/uL — ABNORMAL HIGH (ref 1.7–7.7)
Neutrophils Relative %: 80 %
Platelets: 74 10*3/uL — ABNORMAL LOW (ref 150–400)
RBC: 2.99 MIL/uL — ABNORMAL LOW (ref 3.87–5.11)
RDW: 33.3 % — ABNORMAL HIGH (ref 11.5–15.5)
WBC: 14.3 10*3/uL — ABNORMAL HIGH (ref 4.0–10.5)
nRBC: 0 % (ref 0.0–0.2)

## 2020-04-20 LAB — PHOSPHORUS: Phosphorus: 3.3 mg/dL (ref 2.5–4.6)

## 2020-04-20 LAB — PROTIME-INR
INR: 1.4 — ABNORMAL HIGH (ref 0.8–1.2)
Prothrombin Time: 17 seconds — ABNORMAL HIGH (ref 11.4–15.2)

## 2020-04-20 MED ORDER — POTASSIUM CHLORIDE 10 MEQ/100ML IV SOLN
10.0000 meq | INTRAVENOUS | Status: AC
  Administered 2020-04-20 (×5): 10 meq via INTRAVENOUS
  Filled 2020-04-20 (×4): qty 100

## 2020-04-20 MED ORDER — ENSURE ENLIVE PO LIQD
237.0000 mL | Freq: Three times a day (TID) | ORAL | Status: DC
  Administered 2020-04-20 – 2020-04-23 (×10): 237 mL via ORAL

## 2020-04-20 NOTE — Progress Notes (Addendum)
Physical Therapy Treatment Patient Details Name: Gina Hooper MRN: [id number] DOB: [birth year] Today's Date: 04/20/2020    History of Present Illness Pt admitted with acute encephalopathy and noted to be in acute liver failure in the setting of Alchoholic Hepatitis with profound coagulopathy and hematemesis. ETT x9 days. Pt with hx of ETOH and polysubstance abuse as well as osteomylitis of L ankle    PT Comments    Pt motivated to get to recliner this day, stating "I am going to walk today". Pt requiring mod assist +2 for mobility overall, pt continuing to demonstrate LE weakness in standing noted in LE trembling and uncontrolled recurvatum. Pt required pericare assist from PT and RN this day, pt unable to assist. PTA, pt was completely independent with mobility and would like to return to this. PT recommending CIR, PT confirmed today 24/7 assist from family upon d/c from acute setting. Will continue to follow.     Follow Up Recommendations  CIR     Equipment Recommendations  Other (comment) (defer)    Recommendations for Other Services Rehab consult     Precautions / Restrictions Precautions Precautions: Fall Restrictions Weight Bearing Restrictions: No    Mobility  Bed Mobility Overal bed mobility: Needs Assistance Bed Mobility: Supine to Sit     Supine to sit: Mod assist     General bed mobility comments: Mod assist for LE translation to EOB, trunk elevation, and scooting to EOB with increased time.  Transfers Overall transfer level: Needs assistance Equipment used: Rolling walker (2 wheeled) Transfers: Stand Pivot Transfers;Sit to/from Stand Sit to Stand: Mod assist Stand pivot transfers: Mod assist       General transfer comment: Mod assist for power up, hip extension, weight shifting L and R to bring UEs to RW, and steadying upon standing. Pt initially with posterior leaning, corrected with tactile and verbal cuing. Sit to stand x2, from EOB and from [facility].  Mod assist for stand pivot for steadying, RW management, and slow lower onto BSC.  Ambulation/Gait Ambulation/Gait assistance: Mod assist;+2 safety/equipment Gait Distance (Feet): 3 Feet Assistive device: Rolling walker (2 wheeled) Gait Pattern/deviations: Step-through pattern;Decreased stride length;Trunk flexed;Wide base of support;Drifts right/left Gait velocity: decr   General Gait Details: Mod assist for steadying, truncal support, and RW/lines management. Verbal cuing for upright posture, turning to prepare for descent to recliner. Pt with knee recurvatum noted, 2* weakness.   Stairs             Wheelchair Mobility    Modified Rankin (Stroke Patients Only)       Balance Overall balance assessment: Needs assistance Sitting-balance support: No upper extremity supported;Feet supported Sitting balance-Leahy Scale: Fair Sitting balance - Comments: able to sit EOB without PT assist, x5 minutes in preparation for stand and transfer to [facility]   Standing balance support: During functional activity;Bilateral upper extremity supported Standing balance-Leahy Scale: Poor Standing balance comment: reliant on external support in standing                            Cognition Arousal/Alertness: Awake/alert Behavior During Therapy: WFL for tasks assessed/performed;Impulsive Overall Cognitive Status: Impaired/Different from baseline Area of Impairment: Following commands;Safety/judgement;Problem solving                       Following Commands: Follows one step commands with increased time Safety/Judgement: Decreased awareness of deficits   Problem Solving: Requires tactile cues;Requires verbal cues;Slow processing General Comments:  Increased processing time and requires increased time to perform mobility tasks. Moves impulsively at times, requires safety cuing. Sequencing deficits noted, i.e. during bringing hands to RW pt placing R hand on L RW handle requiring  multimodal cuing to correct.      Exercises      General Comments General comments (skin integrity, edema, etc.): HRmax 131 bpm during mobility. SpO2 unable to read secondary to poor pleth and SpO2 monitor falling off during session, on RA with DOE 2/4 with short distance ambulation, recovered with rest.      Pertinent Vitals/Pain Pain Assessment: Faces Faces Pain Scale: Hurts little more Pain Location: abdomen (needs to have BM) Pain Descriptors / Indicators: Discomfort;Grimacing Pain Intervention(s): Limited activity within patient's tolerance;Monitored during session;Repositioned    Home Living Family/patient expects to be discharged to:: Private residence Living Arrangements: Other relatives (grandmother) Available Help at Discharge: Family;Available 24 hours/day Type of Home: House Home Access: Stairs to enter     Home Equipment: None      Prior Function Level of Independence: Independent      Comments: pt reports complete independence PTA   PT Goals (current goals can now be found in the care plan section) Acute Rehab PT Goals Patient Stated Goal: walk more PT Goal Formulation: With patient Time For Goal Achievement: 04/29/20 Potential to Achieve Goals: Good Progress towards PT goals: Progressing toward goals    Frequency    Min 3X/week      PT Plan Discharge plan needs to be updated    Co-evaluation              AM-PAC PT "6 Clicks" Mobility   Outcome Measure  Help needed turning from your back to your side while in a flat bed without using bedrails?: A Lot Help needed moving from lying on your back to sitting on the side of a flat bed without using bedrails?: A Lot Help needed moving to and from a bed to a chair (including a wheelchair)?: A Lot Help needed standing up from a chair using your arms (e.g., wheelchair or bedside chair)?: A Lot Help needed to walk in hospital room?: A Lot Help needed climbing 3-5 steps with a railing? : Total 6  Click Score: 11    End of Session Equipment Utilized During Treatment: Gait belt Activity Tolerance: Patient limited by fatigue Patient left: with call bell/phone within reach;in chair;with chair alarm set;with nursing/sitter in room Nurse Communication: Mobility status PT Visit Diagnosis: Unsteadiness on feet (R26.81);Muscle weakness (generalized) (M62.81);Difficulty in walking, not elsewhere classified (R26.2)     Time: 1000-1024 PT Time Calculation (min) (ACUTE ONLY): 24 min  Charges:  $Therapeutic Activity: 23-37 mins                     Sanah Kraska E, PT Acute Rehabilitation Services Pager [phone]  Office [phone]   Stephen Turnbaugh D Despina Hidden 04/20/2020, 10:42 AM

## 2020-04-20 NOTE — Progress Notes (Signed)
PROGRESS NOTE    Gina Hooper  KLK:[id number] DOB: June 20, 1991 DOA: 04/04/2020 PCP: Patient, No Pcp Per   Chief Complaint  Patient presents with  . Emesis  . Hypoglycemia   Brief Narrative: 29 year old female with sudden severe alcohol abuse depression anemia history presented initially 04/04/2020 regarding pain, nausea vomiting, hypoglycemia at home.  In the ED found to have acute liver failure AKI creatinine 1.2 ALP 147 AST more than 10,000 ALT 4566 total bili 4.8 INR 9.9.  Patient was admitted underwent extensive hospitalization further evaluation with GI, treated with MAC, completed 04/07/2020, continued on IV Solu-Medrol, evaluated from a transplant standpoint and deemed not a candidate due to her underlying substance abuse and lack of insurance.  Hospital course complicated by hematemesis on 8/12 due to coagulopathy, felt to be high risk and poor endoscopy candidate and treated with PPI.  Patient developed progressive worsening mentation and became minimally responsive encephalopathic needing intubation 04/06/2020, MRI 8/14 no acute finding, seen by neurology, EEG 8/16 severe diffuse encephalopathy, no seizure, extubated 8/22, transferred to [facility] 8/24.  Pt was placed on Precedex overnight 8/23 due to significant agitation and off  8/24 night. She is waking up more. SLP has placed on diet. PTOT is working with her  Subjective:  Is more alert awake,. Oriented to place, month/year, and aware about her liver failure. Afebrile overnight.  LFTs slightly downtrending total bili remains up Off restraints x 48 hr reports she ate some yesterday, breakfast not her yet.  Assessment & Plan:  Acute liver failure/Jaundice in the setting of acute alcohol hepatitis +/- possible Tylenol toxicity/possible SBP: Seen by GI, was discussed with the transplant team while in ICU and not felt to be a candidate for liver transplant. MELD -na 40, discriminant function 304 on admission.  AST ALT slowly  downtrending TB remains significantly up INR at 1.4.  Remains lethargic confused but slowly improving.  Continue on supportive measures, lactulose/rifaximin, multivitamins.  On ceftriaxone for possible SBP complete course of 10 days. Continue to work with PT OT and speech. Recent Labs  Lab 04/14/20 0647 04/14/20 0647 04/15/20 0346 04/15/20 0346 04/16/20 0452 04/17/20 0515 04/18/20 0301 04/19/20 0219 04/20/20 0304  AST 281*   < > 291*   < > 331* 276* 286* 257* 196*  ALT 410*   < > 389*   < > 365* 314* 314* 268* 244*  ALKPHOS 123   < > 118   < > 131* 123 149* 158* 190*  BILITOT 22.6*   < > 23.0*   < > 23.7* 23.7* 26.6* 25.8* 28.3*  PROT 5.2*   < > 5.6*   < > 5.2* 4.9* 5.3* 5.1* 5.5*  ALBUMIN 2.3*   < > 2.3*   < > 2.1* 1.9* 2.1* 1.8* 1.8*  INR 1.6*  --  1.5*  --   --  1.5*  --   --  1.4*   < > = values in this interval not displayed.   Acute hepatic/metabolic encephalopathy/acute alcohol withdrawal: Seen by neurology, MRI brain no acute finding EEG with diffuse encephalopathy.  Patient waking up more.  Has been off restraint; off Precedex 8/24 . Cont Lactulose, rifaximin folic acid multivitamin thiamine.  FEN: Cont Dextrosel RL ivf, monitor cbg, slp on board. taking only sips. Augment diet.  Hypernatremia/Hypokalemia: Potassium low and being repleted with IV KCl  Acute respiratory failure due to liver failure hepatoencephalopathy needing intubation-on vent for 9 days.  Currently on room air resolved  Coagulopathy secondary to liver failure.  INR is stable  Upper GI bleed/hematemesis in the setting of gastritis/coagulopathy seen by GI poor endoscopy candidate monitor CBC, continue PPI Anemia acute blood loss less/anemia of critical illness: Monitor hemoglobin transfuse for less than 7 g Recent Labs  Lab 04/16/20 0452 04/17/20 0515 04/18/20 0301 04/19/20 0219 04/20/20 0304  HGB 8.8* 7.7* 8.3* 8.4* 8.6*  HCT 27.0* 24.4* 25.2* 24.6* 26.1*   Thrombocytopenia in the setting of liver  disease and alcohol abuse: Remains low but stable. . Recent Labs  Lab 04/16/20 0452 04/17/20 0515 04/18/20 0301 04/19/20 0219 04/20/20 0304  PLT 67* 67* 74* 73* 74*   Moderate protein calorie malnutrition: Continue to augment nutrition  CoNS in blood cx 1 bottle-likely contamination.  Deconditioning: PT OT consult and mobilize. Will need SNF  DVT prophylaxis: SCDs Start: 04/04/20 1659 Code Status:   Code Status: Full Code  Family Communication: plan of care discussed with patient at bedside.  Status is: Inpatient  Remains inpatient appropriate because:IV treatments appropriate due to intensity of illness or inability to take PO and Inpatient level of care appropriate due to severity of illness   Dispo: The patient is from: Home              Anticipated d/c is to: TBD              Anticipated d/c date is: > 3 days              Patient currently is not medically stable to d/c. Okay to transfer to telemetry unit form step down.  Nutrition: Diet Order            DIET DYS 3 Room service appropriate? Yes with Assist; Fluid consistency: Thin  Diet effective now                 Nutrition Problem: Inadequate oral intake Etiology: inability to eat Signs/Symptoms: NPO status Interventions: Refer to RD note for recommendations Body mass index is 27.93 kg/m.  Consultants:see note  Procedures:  UDS 8/11 >> opiate / THC / cocaine positive  CT ABD 8/11 >> borderline hepatomegaly with diffuse patchy low-attenuation with concern for acute inflammation/hepatitis.  Gallbladder wall thickening with pericholecystic fluid felt reactive there were no gallstones.  There is a small right effusion.  Mild ascites in the pelvis. MRI Brain 8/14 >> no acute intracranial process. Mild pansinus disease  EEG 8/16 >> suggestive of severe diffuse encephalopathy, non-specific etiology ECHO 8/19 >> LVEF 65-70%, no RWMA, RV systolic function normal LE Venous Duplex 8/19 >> negative  Microbiology:see  note Blood Culture    Component Value Date/Time   SDES  01/14/2018 0005    BLOOD RIGHT FOREARM Performed at [facility], [address] [place], [place] [address]    [facility]  01/14/2018 0005    BOTTLES DRAWN AEROBIC AND ANAEROBIC Blood Culture adequate volume Performed at [facility], [address] [place], [place] [address]    CULT (A) 01/14/2018 0005    STAPHYLOCOCCUS SPECIES (COAGULASE NEGATIVE) THE SIGNIFICANCE OF ISOLATING THIS ORGANISM FROM A SINGLE SET OF BLOOD CULTURES WHEN MULTIPLE SETS ARE DRAWN IS UNCERTAIN. PLEASE NOTIFY THE MICROBIOLOGY DEPARTMENT WITHIN ONE WEEK IF SPECIATION AND SENSITIVITIES ARE REQUIRED. Performed at [facility], [place] [address], [place], [place] [address]    REPTSTATUS 01/17/2018 FINAL 01/14/2018 0005    Other culture-see note  Medications: Scheduled Meds: . Chlorhexidine Gluconate Cloth  6 each Topical Daily  . folic acid  1  mg Intravenous Daily  . insulin aspart  0-6 Units Subcutaneous Q4H  . lactulose  30 g Per Tube Daily  . mouth rinse  15 mL Mouth Rinse BID  . multivitamin  15 mL Per Tube q1800  . pantoprazole (PROTONIX) IV  40 mg Intravenous Q24H  . rifaximin  550 mg Oral BID  . sodium chloride flush  10-40 mL Intracatheter Q12H  . sodium chloride flush  10-40 mL Intracatheter Q12H  . thiamine  100 mg Per Tube Daily   Or  . thiamine  100 mg Intravenous Daily   Continuous Infusions: . cefTRIAXone (ROCEPHIN)  IV Stopped (04/19/20 1205)  . dexmedetomidine (PRECEDEX) IV infusion Stopped (04/17/20 0405)  . dextrose 5% lactated ringers 50 mL/hr at 04/20/20 0500  . potassium chloride 10 mEq (04/20/20 0610)    Antimicrobials: Anti-infectives (From admission, onward)   Start     Dose/Rate Route Frequency Ordered Stop   04/18/20 2215  rifaximin (XIFAXAN) tablet 550 mg        550 mg Oral 2 times daily 04/18/20 2208     04/15/20 1000  rifaximin (XIFAXAN) tablet 550 mg  Status:  Discontinued        550  mg Per Tube 2 times daily 04/15/20 0856 04/18/20 2209   04/12/20 1100  rifaximin (XIFAXAN) tablet 550 mg  Status:  Discontinued        550 mg Per Tube 2 times daily 04/12/20 1024 04/15/20 0856   04/12/20 1100  cefTRIAXone (ROCEPHIN) 2 g in sodium chloride 0.9 % 100 mL IVPB        2 g 200 mL/hr over 30 Minutes Intravenous Every 24 hours 04/12/20 1044         Objective: Vitals: Today's Vitals   04/19/20 1800 04/19/20 2000 04/20/20 0000 04/20/20 0400  BP: (!) 139/102 (!) 134/112    Pulse:      Resp: (!) 25 (!) 26    Temp:  98.3 F (36.8 C) 98.7 F (37.1 C) 99.1 F (37.3 C)  TempSrc:  Oral Oral Oral  SpO2: 100% 100%    Weight:      Height:      PainSc:  0-No pain      Intake/Output Summary (Last 24 hours) at 04/20/2020 0750 Last data filed at 04/20/2020 0500 Gross per 24 hour  Intake 939.93 ml  Output 400 ml  Net 539.93 ml   Filed Weights   04/17/20 0500 04/18/20 0500 04/19/20 0433  Weight: 73.7 kg 78 kg 78.5 kg   Weight change:    Intake/Output from previous day: 08/26 0701 - 08/27 0700 In: 939.9 [I.V.:836.6; IV Piggyback:103.3] Out: 400 [Urine:400] Intake/Output this shift: No intake/output data recorded.  Examination:  General exam: AAOx2-3,NAD, weak appearing. HEENT:Oral mucosa moist, Ear/Nose WNL grossly, dentition normal. Icteric, left conjunctive bloody. Respiratory system: bilaterally clear,no wheezing or crackles,no use of accessory muscle Cardiovascular system: S1 & S2 +, No JVD. Gastrointestinal system: Abdomen soft, NT,ND, BS+ Nervous System:Alert, awake, moving extremities and grossly nonfocal Extremities: No edema, distal peripheral pulses palpable.  Skin: No rashes,no icterus. MSK: Normal muscle bulk,tone, power Foley+  Data Reviewed: I have personally reviewed following labs and imaging studies CBC: Recent Labs  Lab 04/16/20 0452 04/17/20 0515 04/18/20 0301 04/19/20 0219 04/20/20 0304  WBC 15.6* 12.8* 15.5* 14.9* 14.3*  NEUTROABS  --    --  12.2* 11.8* 11.6*  HGB 8.8* 7.7* 8.3* 8.4* 8.6*  HCT 27.0* 24.4* 25.2* 24.6* 26.1*  MCV 84.6 88.1 85.7 84.0 87.3  PLT 67* 67* 74* 73* 74*   Basic Metabolic Panel: Recent Labs  Lab 04/16/20 0452 04/17/20 0515 04/18/20 0301 04/19/20 0219 04/20/20 0304  NA 147* 150* 142 137 135  K 4.6 3.5 3.1* 3.6 3.2*  CL 117* 118* 111 110 109  CO2 22 25 21* 19* 19*  GLUCOSE 86 115* 109* 97 98  BUN 22* 21* _0 CREATININE <0.30* 0.72 0.72 <0.30* 0.41*  CALCIUM 7.9* 8.1* 8.0* 7.8* 7.9*  MG  --  2.3  --   --   --   PHOS  --  3.4 3.4 3.4 3.3   GFR: Estimated Creatinine Clearance: 109.7 mL/min (A) (by C-G formula based on SCr of 0.41 mg/dL (L)). Liver Function Tests: Recent Labs  Lab 04/16/20 0452 04/17/20 0515 04/18/20 0301 04/19/20 0219 04/20/20 0304  AST 331* 276* 286* 257* 196*  ALT 365* 314* 314* 268* 244*  ALKPHOS 131* 123 149* 158* 190*  BILITOT 23.7* 23.7* 26.6* 25.8* 28.3*  PROT 5.2* 4.9* 5.3* 5.1* 5.5*  ALBUMIN 2.1* 1.9* 2.1* 1.8* 1.8*   No results for input(s): LIPASE, AMYLASE in the last 168 hours. Recent Labs  Lab 04/16/20 1554  AMMONIA 24   Coagulation Profile: Recent Labs  Lab 04/14/20 0647 04/15/20 0346 04/17/20 0515 04/20/20 0304  INR 1.6* 1.5* 1.5* 1.4*   Cardiac Enzymes: No results for input(s): CKTOTAL, CKMB, CKMBINDEX, TROPONINI in the last 168 hours. BNP (last 3 results) No results for input(s): PROBNP in the last 8760 hours. HbA1C: No results for input(s): HGBA1C in the last 72 hours. CBG: Recent Labs  Lab 04/19/20 1132 04/19/20 1701 04/19/20 2037 04/20/20 0002 04/20/20 0319  GLUCAP 117* 92 129* 163* 112*   Lipid Profile: No results for input(s): CHOL, HDL, LDLCALC, TRIG, CHOLHDL, LDLDIRECT in the last 72 hours. Thyroid Function Tests: No results for input(s): TSH, T4TOTAL, FREET4, T3FREE, THYROIDAB in the last 72 hours. Anemia Panel: No results for input(s): VITAMINB12, FOLATE, FERRITIN, TIBC, IRON, RETICCTPCT in the last 72  hours. Sepsis Labs: Recent Labs  Lab 04/13/20 1153 04/14/20 0647 04/15/20 0346 04/17/20 0515  PROCALCITON 1.97 2.01 2.10  --   LATICACIDVEN  --   --   --  1.2    No results found for this or any previous visit (from the past 240 hour(s)).    Radiology Studies: No results found.   LOS: 16 days   Antonieta Pert, MD [facility]  04/20/2020, 7:50 AM

## 2020-04-20 NOTE — Plan of Care (Signed)

## 2020-04-20 NOTE — Plan of Care (Signed)
Pt moved out of ICU to tele bed today. Pt weak and exhausted but maintaining oxygen sats on room air. Pt progressing with swallowing. Had a BM on BSC with 2+ assist with transfers.

## 2020-04-20 NOTE — Progress Notes (Signed)
Rehab Admissions Coordinator Note:  Patient was screened by Clois Dupes for appropriateness for an Inpatient Acute Rehab Consult per change in PT recs today. If patient and family would like patient considered for Cir admit, please place rehab consult. Please advise.  Clois Dupes RN MSN 04/20/2020, 10:42 AM  I can be reached at [phone].

## 2020-04-20 NOTE — Progress Notes (Signed)
Nutrition Follow-up  DOCUMENTATION CODES:   Not applicable  INTERVENTION:  Ensure Enlive po TID, each supplement provides 350 kcal and 20 grams of protein   NUTRITION DIAGNOSIS:   Inadequate oral intake related to inability to eat as evidenced by NPO status. -- Progressing; pt now on dysphagia 3 diet with thin liquids  GOAL:   Patient will meet greater than or equal to 90% of their needs --  Progressing.   MONITOR:   Diet advancement, Labs, Weight trends  REASON FOR ASSESSMENT:   Ventilator, Consult Enteral/tube feeding initiation and management  ASSESSMENT:   29 year old female with medical history of severe alcoholic dependence and polysubstance abuse. She presented to the ED on 8/11 with abdominal pain and N/V. She was found to be in acute liver failure in the setting of alcoholic hepatitis with profound coagulopathy and hematemesis. CCM evaluated 8/12 due to declining mental status and concern about IV access.  8/11- admission 8/13- intubation; OGT placement; MRI head negative 8/16- initial RD assessment; TF initiation; EEG showing diffuse encephalopathy 8/22- extubation; OGT removed 8/23 diet advanced to full liquids (nectar thick) 8/24 diet advanced to dysphagia 3 with thin liquids  Discussed pt with RN.   Pt's appetite is improving, but is still not back to baseline. RD will order oral nutrition supplements to help pt meet kcal/protein needs.   Limited meal documentation available. 10% x 1 recorded meal.   Labs: K+ 3.2 (L), CBGs 112-163 Medications: Folic acid, Novolog, Chronulac, MVI, Thiamine, Protonix, KCl every 1 hour x 6  Diet Order:   Diet Order            DIET DYS 3 Room service appropriate? Yes with Assist; Fluid consistency: Thin  Diet effective now                 EDUCATION NEEDS:   No education needs have been identified at this time  Skin:  Skin Assessment: Reviewed RN Assessment  Last BM:  8/25  Height:   Ht Readings from  Last 1 Encounters:  04/04/20 5\' 6"  (1.676 m)    Weight:   Wt Readings from Last 1 Encounters:  04/19/20 78.5 kg    BMI:  Body mass index is 27.93 kg/m.  Estimated Nutritional Needs:   Kcal:  2005-2210 kcal  Protein:  100-110 grams  Fluid:  >/= 2.2 L/day    09-26-1968, MS, RD, LDN RD pager number and weekend/on-call pager number located in Amion.

## 2020-04-21 DIAGNOSIS — K7201 Acute and subacute hepatic failure with coma: Secondary | ICD-10-CM | POA: Diagnosis not present

## 2020-04-21 LAB — COMPREHENSIVE METABOLIC PANEL
ALT: 209 U/L — ABNORMAL HIGH (ref 0–44)
AST: 172 U/L — ABNORMAL HIGH (ref 15–41)
Albumin: 1.8 g/dL — ABNORMAL LOW (ref 3.5–5.0)
Alkaline Phosphatase: 187 U/L — ABNORMAL HIGH (ref 38–126)
Anion gap: 7 (ref 5–15)
BUN: 12 mg/dL (ref 6–20)
CO2: 21 mmol/L — ABNORMAL LOW (ref 22–32)
Calcium: 8 mg/dL — ABNORMAL LOW (ref 8.9–10.3)
Chloride: 107 mmol/L (ref 98–111)
Creatinine, Ser: 0.68 mg/dL (ref 0.44–1.00)
GFR calc Af Amer: >60 mL/min (ref >60)
GFR calc non Af Amer: >60 mL/min (ref >60)
Glucose, Bld: 121 mg/dL — ABNORMAL HIGH (ref 70–99)
Potassium: 2.9 mmol/L — ABNORMAL LOW (ref 3.5–5.1)
Sodium: 135 mmol/L (ref 135–145)
Total Bilirubin: 26.8 mg/dL (ref 0.3–1.2)
Total Protein: 5.3 g/dL — ABNORMAL LOW (ref 6.5–8.1)

## 2020-04-21 LAB — GLUCOSE, CAPILLARY
Glucose-Capillary: 100 mg/dL — ABNORMAL HIGH (ref 70–99)
Glucose-Capillary: 105 mg/dL — ABNORMAL HIGH (ref 70–99)
Glucose-Capillary: 110 mg/dL — ABNORMAL HIGH (ref 70–99)
Glucose-Capillary: 81 mg/dL (ref 70–99)
Glucose-Capillary: 83 mg/dL (ref 70–99)
Glucose-Capillary: 90 mg/dL (ref 70–99)

## 2020-04-21 LAB — CBC WITH DIFFERENTIAL/PLATELET
Abs Immature Granulocytes: 0.21 10*3/uL — ABNORMAL HIGH (ref 0.00–0.07)
Basophils Absolute: 0 10*3/uL (ref 0.0–0.1)
Basophils Relative: 0 %
Eosinophils Absolute: 0.1 10*3/uL (ref 0.0–0.5)
Eosinophils Relative: 0 %
HCT: 25.3 % — ABNORMAL LOW (ref 36.0–46.0)
Hemoglobin: 8.3 g/dL — ABNORMAL LOW (ref 12.0–15.0)
Immature Granulocytes: 2 %
Lymphocytes Relative: 9 %
Lymphs Abs: 1.1 10*3/uL (ref 0.7–4.0)
MCH: 28.9 pg (ref 26.0–34.0)
MCHC: 32.8 g/dL (ref 30.0–36.0)
MCV: 88.2 fL (ref 80.0–100.0)
Monocytes Absolute: 0.7 10*3/uL (ref 0.1–1.0)
Monocytes Relative: 6 %
Neutro Abs: 11.2 10*3/uL — ABNORMAL HIGH (ref 1.7–7.7)
Neutrophils Relative %: 83 %
Platelets: 97 10*3/uL — ABNORMAL LOW (ref 150–400)
RBC: 2.87 MIL/uL — ABNORMAL LOW (ref 3.87–5.11)
WBC: 13.3 10*3/uL — ABNORMAL HIGH (ref 4.0–10.5)
nRBC: 0 % (ref 0.0–0.2)

## 2020-04-21 LAB — POTASSIUM: Potassium: 3.4 mmol/L — ABNORMAL LOW (ref 3.5–5.1)

## 2020-04-21 MED ORDER — SODIUM CHLORIDE (PF) 0.9 % IJ SOLN
INTRAMUSCULAR | Status: AC
  Filled 2020-04-21: qty 50

## 2020-04-21 MED ORDER — TAMSULOSIN HCL 0.4 MG PO CAPS
0.4000 mg | ORAL_CAPSULE | Freq: Every day | ORAL | Status: DC
  Administered 2020-04-21 – 2020-04-30 (×7): 0.4 mg via ORAL
  Filled 2020-04-21 (×10): qty 1

## 2020-04-21 MED ORDER — POTASSIUM CHLORIDE CRYS ER 20 MEQ PO TBCR
40.0000 meq | EXTENDED_RELEASE_TABLET | Freq: Once | ORAL | Status: AC
  Administered 2020-04-21: 40 meq via ORAL
  Filled 2020-04-21: qty 2

## 2020-04-21 MED ORDER — POTASSIUM CHLORIDE 10 MEQ/100ML IV SOLN
10.0000 meq | INTRAVENOUS | Status: AC
  Administered 2020-04-21 (×3): 10 meq via INTRAVENOUS
  Filled 2020-04-21 (×3): qty 100

## 2020-04-21 MED ORDER — POTASSIUM CHLORIDE 10 MEQ/100ML IV SOLN
10.0000 meq | Freq: Once | INTRAVENOUS | Status: AC
  Administered 2020-04-21: 10 meq via INTRAVENOUS
  Filled 2020-04-21: qty 100

## 2020-04-21 NOTE — Progress Notes (Signed)
Came to assess midline per RN request. Right arm is swollen below the midline. Midline flushed with ease and great blood return. Told RN that she can still use the midline and ask the MD if they want to order an ultrasound. Hand is warm with great pulses. No need to remove midline at this time.

## 2020-04-21 NOTE — Progress Notes (Signed)
Patient's right arm noted to be swollen. Midline infusing. IV team and Md notified. Patient's abd also noted to be distended and firm. She was incontinent of urine. Assisted up to [facility] and she voided and had loose stool. Foley previously removed 8/27 so patient bladder scanned and noted to have 920 ml in bladder after voiding. Verified twice. Md notified and orders obtained to In and Out cath. Upon insertion of catheter, approx 20 ml orange/brown urine returned. Patient very tensed up with catheterization. Md made aware and orders for foley to be placed and maintained. Bladder scanned several times while attempting to cath pt and scanner showed 980+ ml. Very small amount noted in foley tubing. Continue to monitor. Melton Alar, RN

## 2020-04-21 NOTE — Progress Notes (Signed)
PROGRESS NOTE    Gina Hooper  GSU:[id number] DOB: 1991/05/06 DOA: 04/04/2020 PCP: Patient, No Pcp Per   Chief Complaint  Patient presents with  . Emesis  . Hypoglycemia   Brief Narrative: 29 year old female with sudden severe alcohol abuse depression anemia history presented initially 04/04/2020 regarding pain, nausea vomiting, hypoglycemia at home.  In the ED found to have acute liver failure AKI creatinine 1.2 ALP 147 AST more than 10,000 ALT 4566 total bili 4.8 INR 9.9.  Patient was admitted underwent extensive hospitalization further evaluation with GI, treated with MAC, completed 04/07/2020, continued on IV Solu-Medrol, evaluated from a transplant standpoint and deemed not a candidate due to her underlying substance abuse and lack of insurance.  Hospital course complicated by hematemesis on 8/12 due to coagulopathy, felt to be high risk and poor endoscopy candidate and treated with PPI.  Patient developed progressive worsening mentation and became minimally responsive encephalopathic needing intubation 04/06/2020, MRI 8/14 no acute finding, seen by neurology, EEG 8/16 severe diffuse encephalopathy, no seizure, extubated 8/22, transferred to [facility] 8/24.  Pt was placed on Precedex overnight 8/23 due to significant agitation and off  8/24 night. She is waking up more. SLP has placed on diet. PTOT is working with her Patient transfer out of stepdown unit 8/27.  Subjective: She is alert awake, reports he is feeling much better overall but he still appears deconditioned weak and frail.  LFTs remains up.  She reports she ate some.  For the first time today saw her is smiling. Events overnight.  Assessment & Plan:  Acute liver failure/Jaundice in the setting of acute alcohol hepatitis +/- possible Tylenol toxicity/possible SBP: Seen by GI, was discussed with the transplant team while in ICU and not felt to be a candidate for liver transplant. MELD -na 40, discriminant function 304 on admission.   LFTs remain soft AST ALT slowly downtrending, total bili remains up INR at 1.4.  Seen is more alert awake, encourage PT OT further mobilization, continue supportive measures with lactulose/rifaximin, multivitamins.  On ceftriaxone for possible SBP complete course of 10 days.  Recent Labs  Lab 04/15/20 0346 04/16/20 0452 04/17/20 0515 04/18/20 0301 04/19/20 0219 04/20/20 0304 04/21/20 0357  AST 291*   < > 276* 286* 257* 196* 172*  ALT 389*   < > 314* 314* 268* 244* 209*  ALKPHOS 118   < > 123 149* 158* 190* 187*  BILITOT 23.0*   < > 23.7* 26.6* 25.8* 28.3* 26.8*  PROT 5.6*   < > 4.9* 5.3* 5.1* 5.5* 5.3*  ALBUMIN 2.3*   < > 1.9* 2.1* 1.8* 1.8* 1.8*  INR 1.5*  --  1.5*  --   --  1.4*  --    < > = values in this interval not displayed.   Acute hepatic/metabolic encephalopathy/acute alcohol withdrawal: Seen by neurology, MRI brain no acute finding EEG with diffuse encephalopathy.  She is alert awake oriented and has significantly improved.  Continue supportive measures, off restraint and off Precedex 8/24 . Cont Lactulose, rifaximin folic acid multivitamin thiamine.  OPF:YTWK Dextrosel RL ivf,monitor cbg, slp on board. taking only sips. Augment diet.  Hypernatremia/Hypokalemia:Potassium 2.9 and being aggressively repleted.  Acute respiratory failure due to liver failure hepatoencephalopathy needing intubation-on vent for 9 days.  Currently on room air resolved.  Coagulopathy secondary to liver failure.INR is stable.  Upper GI bleed/hematemesis in the setting of gastritis/coagulopathy seen by GI poor endoscopy candidate monitor CBC.Overall hemoglobin is stable. continue PPI. Anemia acute blood loss  less/anemia of critical illness: Monitor hemoglobin. Recent Labs  Lab 04/17/20 0515 04/18/20 0301 04/19/20 0219 04/20/20 0304 04/21/20 0357  HGB 7.7* 8.3* 8.4* 8.6* 8.3*  HCT 24.4* 25.2* 24.6* 26.1* 25.3*   Thrombocytopenia in the setting of liver disease and alcohol abuse: Remains low  but stable. Recent Labs  Lab 04/17/20 0515 04/18/20 0301 04/19/20 0219 04/20/20 0304 04/21/20 0357  PLT 67* 74* 73* 74* 97*   Moderate protein calorie malnutrition:Continue to augment nutrition.  CoNS in blood cx 1 bottle-likely contamination.  Leukocytosis  wbc downtrending.On rocephin  Deconditioning:PT OT consult and mobilize.CIR has been advised by PT OT and I have consulted inpatient rehab.  Foley + will d/c and encourage ambulation and voiding trial.  DVT prophylaxis: SCDs Start: 04/04/20 1659 Code Status:   Code Status: Full Code  Family Communication: plan of care discussed with patient at bedside.  Status is: Inpatient  Remains inpatient appropriate because:IV treatments appropriate due to intensity of illness or inability to take PO and Inpatient level of care appropriate due to severity of illness  Dispo: The patient is from: Home              Anticipated d/c is to: TBD              Anticipated d/c date is: > 3 days              Patient currently is not medically stable to d/c. Nutrition: Diet Order            DIET DYS 3 Room service appropriate? Yes with Assist; Fluid consistency: Thin  Diet effective now                 Nutrition Problem: Inadequate oral intake Etiology: inability to eat Signs/Symptoms: NPO status Interventions: Refer to RD note for recommendations Body mass index is 29.11 kg/m.  Consultants:see note  Procedures:  UDS 8/11 >> opiate / THC / cocaine positive  CT ABD 8/11 >> borderline hepatomegaly with diffuse patchy low-attenuation with concern for acute inflammation/hepatitis.  Gallbladder wall thickening with pericholecystic fluid felt reactive there were no gallstones.  There is a small right effusion.  Mild ascites in the pelvis. MRI Brain 8/14 >> no acute intracranial process. Mild pansinus disease  EEG 8/16 >> suggestive of severe diffuse encephalopathy, non-specific etiology ECHO 8/19 >> LVEF 65-70%, no RWMA, RV systolic  function normal LE Venous Duplex 8/19 >> negative  Microbiology:see note Blood Culture    Component Value Date/Time   SDES  01/14/2018 0005    BLOOD RIGHT FOREARM Performed at [facility], [address] [place], [place] [address]    [facility]  01/14/2018 0005    BOTTLES DRAWN AEROBIC AND ANAEROBIC Blood Culture adequate volume Performed at [facility], [address] [place], [place] [address]    CULT (A) 01/14/2018 0005    STAPHYLOCOCCUS SPECIES (COAGULASE NEGATIVE) THE SIGNIFICANCE OF ISOLATING THIS ORGANISM FROM A SINGLE SET OF BLOOD CULTURES WHEN MULTIPLE SETS ARE DRAWN IS UNCERTAIN. PLEASE NOTIFY THE MICROBIOLOGY DEPARTMENT WITHIN ONE WEEK IF SPECIATION AND SENSITIVITIES ARE REQUIRED. Performed at [facility], [place] [address], [place], [place] [address]    REPTSTATUS 01/17/2018 FINAL 01/14/2018 0005    Other culture-see note  Medications: Scheduled Meds: . Chlorhexidine Gluconate Cloth  6 each Topical Daily  . feeding supplement (ENSURE ENLIVE)  237 mL Oral TID BM  . folic acid  1 mg Intravenous Daily  . insulin aspart  0-6 Units Subcutaneous Q4H  . lactulose  30 g Per Tube Daily  . mouth rinse  15 mL Mouth Rinse BID  . multivitamin  15 mL Per Tube q1800  . pantoprazole (PROTONIX) IV  40 mg Intravenous Q24H  . rifaximin  550 mg Oral BID  . sodium chloride flush  10-40 mL Intracatheter Q12H  . sodium chloride flush  10-40 mL Intracatheter Q12H  . thiamine  100 mg Per Tube Daily   Or  . thiamine  100 mg Intravenous Daily   Continuous Infusions: . cefTRIAXone (ROCEPHIN)  IV 2 g (04/20/20 1336)  . dexmedetomidine (PRECEDEX) IV infusion Stopped (04/17/20 0405)  . dextrose 5% lactated ringers 50 mL/hr at 04/20/20 1804    Antimicrobials: Anti-infectives (From admission, onward)   Start     Dose/Rate Route Frequency Ordered Stop   04/18/20 2215  rifaximin (XIFAXAN) tablet 550 mg        550 mg Oral 2 times daily 04/18/20 2208      04/15/20 1000  rifaximin (XIFAXAN) tablet 550 mg  Status:  Discontinued        550 mg Per Tube 2 times daily 04/15/20 0856 04/18/20 2209   04/12/20 1100  rifaximin (XIFAXAN) tablet 550 mg  Status:  Discontinued        550 mg Per Tube 2 times daily 04/12/20 1024 04/15/20 0856   04/12/20 1100  cefTRIAXone (ROCEPHIN) 2 g in sodium chloride 0.9 % 100 mL IVPB        2 g 200 mL/hr over 30 Minutes Intravenous Every 24 hours 04/12/20 1044         Objective: Vitals: Today's Vitals   04/20/20 2015 04/21/20 0000 04/21/20 0419 04/21/20 0500  BP:  109/78 115/79   Pulse:  (!) 103 98   Resp:  20 (!) 24   Temp:  99.2 F (37.3 C) 99.6 F (37.6 C)   TempSrc:  Oral Oral   SpO2:  100% 100%   Weight:    81.8 kg  Height:      PainSc: 0-No pain       Intake/Output Summary (Last 24 hours) at 04/21/2020 1136 Last data filed at 04/21/2020 0539 Gross per 24 hour  Intake 1382.85 ml  Output --  Net 1382.85 ml   Filed Weights   04/18/20 0500 04/19/20 0433 04/21/20 0500  Weight: 78 kg 78.5 kg 81.8 kg   Weight change:    Intake/Output from previous day: 08/27 0701 - 08/28 0700 In: 1382.9 [P.O.:240; I.V.:1042.9; IV Piggyback:100] Out: 350 [Urine:350] Intake/Output this shift: No intake/output data recorded.  Examination:  General exam: AAOx2-3 , NAD, weak appearing. HEENT:Oral mucosa moist, Ear/Nose WNL grossly, dentition normal. Icteric Respiratory system: bilaterally clear,no wheezing or crackles,no use of accessory muscle Cardiovascular system: S1 & S2 +, No JVD,. Gastrointestinal system: Abdomen soft, NT,ND, BS+ Nervous System:Alert, awake, moving extremities and grossly nonfocal Extremities: No edema, distal peripheral pulses palpable.  Skin: No rashes,no icterus. MSK: Normal muscle bulk,tone, power Foley+  Data Reviewed: I have personally reviewed following labs and imaging studies CBC: Recent Labs  Lab 04/17/20 0515 04/18/20 0301 04/19/20 0219 04/20/20 0304 04/21/20 0357   WBC 12.8* 15.5* 14.9* 14.3* 13.3*  NEUTROABS  --  12.2* 11.8* 11.6* 11.2*  HGB 7.7* 8.3* 8.4* 8.6* 8.3*  HCT 24.4* 25.2* 24.6* 26.1* 25.3*  MCV 88.1 85.7 84.0 87.3 88.2  PLT 67* 74* 73* 74* 97*   Basic Metabolic Panel: Recent Labs  Lab 04/17/20 0515 04/18/20 0301 04/19/20 0219 04/20/20  0304 04/21/20 0357  NA 150* 142 137 135 135  K 3.5 3.1* 3.6 3.2* 2.9*  CL 118* 111 110 109 107  CO2 25 21* 19* 19* 21*  GLUCOSE 115* 109* 97 98 121*  BUN 21* _0 CREATININE 0.72 0.72 <0.30* 0.41* 0.68  CALCIUM 8.1* 8.0* 7.8* 7.9* 8.0*  MG 2.3  --   --   --   --   PHOS 3.4 3.4 3.4 3.3  --    GFR: Estimated Creatinine Clearance: 111.9 mL/min (by C-G formula based on SCr of 0.68 mg/dL). Liver Function Tests: Recent Labs  Lab 04/17/20 0515 04/18/20 0301 04/19/20 0219 04/20/20 0304 04/21/20 0357  AST 276* 286* 257* 196* 172*  ALT 314* 314* 268* 244* 209*  ALKPHOS 123 149* 158* 190* 187*  BILITOT 23.7* 26.6* 25.8* 28.3* 26.8*  PROT 4.9* 5.3* 5.1* 5.5* 5.3*  ALBUMIN 1.9* 2.1* 1.8* 1.8* 1.8*   No results for input(s): LIPASE, AMYLASE in the last 168 hours. Recent Labs  Lab 04/16/20 1554  AMMONIA 24   Coagulation Profile: Recent Labs  Lab 04/15/20 0346 04/17/20 0515 04/20/20 0304  INR 1.5* 1.5* 1.4*   Cardiac Enzymes: No results for input(s): CKTOTAL, CKMB, CKMBINDEX, TROPONINI in the last 168 hours. BNP (last 3 results) No results for input(s): PROBNP in the last 8760 hours. HbA1C: No results for input(s): HGBA1C in the last 72 hours. CBG: Recent Labs  Lab 04/20/20 2025 04/20/20 2356 04/21/20 0415 04/21/20 0808 04/21/20 1121  GLUCAP 97 90 105* 83 81   Lipid Profile: No results for input(s): CHOL, HDL, LDLCALC, TRIG, CHOLHDL, LDLDIRECT in the last 72 hours. Thyroid Function Tests: No results for input(s): TSH, T4TOTAL, FREET4, T3FREE, THYROIDAB in the last 72 hours. Anemia Panel: No results for input(s): VITAMINB12, FOLATE, FERRITIN, TIBC, IRON, RETICCTPCT  in the last 72 hours. Sepsis Labs: Recent Labs  Lab 04/15/20 0346 04/17/20 0515  PROCALCITON 2.10  --   LATICACIDVEN  --  1.2    No results found for this or any previous visit (from the past 240 hour(s)).    Radiology Studies: No results found.   LOS: 17 days   Antonieta Pert, MD [facility]  04/21/2020, 11:36 AM

## 2020-04-21 NOTE — Progress Notes (Signed)
Inpatient Rehab Admissions Coordinator:  CIR consult received.  Called pt's room and there was no answer.  Will attempt again at later date and/or attempt to call family member.  Wolfgang Phoenix, MS, CCC-SLP Admissions Coordinator [phone]

## 2020-04-22 ENCOUNTER — Inpatient Hospital Stay (HOSPITAL_COMMUNITY): Payer: Medicaid Other

## 2020-04-22 ENCOUNTER — Other Ambulatory Visit (HOSPITAL_COMMUNITY): Payer: Self-pay

## 2020-04-22 DIAGNOSIS — K7201 Acute and subacute hepatic failure with coma: Secondary | ICD-10-CM | POA: Diagnosis not present

## 2020-04-22 LAB — CBC WITH DIFFERENTIAL/PLATELET
Abs Immature Granulocytes: 0.19 10*3/uL — ABNORMAL HIGH (ref 0.00–0.07)
Basophils Absolute: 0 10*3/uL (ref 0.0–0.1)
Basophils Relative: 0 %
Eosinophils Absolute: 0.1 10*3/uL (ref 0.0–0.5)
Eosinophils Relative: 0 %
HCT: 23.2 % — ABNORMAL LOW (ref 36.0–46.0)
Hemoglobin: 7.4 g/dL — ABNORMAL LOW (ref 12.0–15.0)
Immature Granulocytes: 2 %
Lymphocytes Relative: 10 %
Lymphs Abs: 1.2 10*3/uL (ref 0.7–4.0)
MCH: 28.7 pg (ref 26.0–34.0)
MCHC: 31.9 g/dL (ref 30.0–36.0)
MCV: 89.9 fL (ref 80.0–100.0)
Monocytes Absolute: 0.7 10*3/uL (ref 0.1–1.0)
Monocytes Relative: 6 %
Neutro Abs: 10 10*3/uL — ABNORMAL HIGH (ref 1.7–7.7)
Neutrophils Relative %: 82 %
Platelets: 96 10*3/uL — ABNORMAL LOW (ref 150–400)
RBC: 2.58 MIL/uL — ABNORMAL LOW (ref 3.87–5.11)
WBC: 12.2 10*3/uL — ABNORMAL HIGH (ref 4.0–10.5)
nRBC: 0 % (ref 0.0–0.2)

## 2020-04-22 LAB — COMPREHENSIVE METABOLIC PANEL
ALT: 166 U/L — ABNORMAL HIGH (ref 0–44)
AST: 134 U/L — ABNORMAL HIGH (ref 15–41)
Albumin: 2 g/dL — ABNORMAL LOW (ref 3.5–5.0)
Alkaline Phosphatase: 164 U/L — ABNORMAL HIGH (ref 38–126)
Anion gap: 9 (ref 5–15)
BUN: 11 mg/dL (ref 6–20)
CO2: 20 mmol/L — ABNORMAL LOW (ref 22–32)
Calcium: 8.2 mg/dL — ABNORMAL LOW (ref 8.9–10.3)
Chloride: 107 mmol/L (ref 98–111)
Creatinine, Ser: 0.78 mg/dL (ref 0.44–1.00)
GFR calc Af Amer: >60 mL/min (ref >60)
GFR calc non Af Amer: >60 mL/min (ref >60)
Glucose, Bld: 99 mg/dL (ref 70–99)
Potassium: 3.8 mmol/L (ref 3.5–5.1)
Sodium: 136 mmol/L (ref 135–145)
Total Bilirubin: 24 mg/dL (ref 0.3–1.2)
Total Protein: 5.1 g/dL — ABNORMAL LOW (ref 6.5–8.1)

## 2020-04-22 LAB — GLUCOSE, CAPILLARY
Glucose-Capillary: 100 mg/dL — ABNORMAL HIGH (ref 70–99)
Glucose-Capillary: 116 mg/dL — ABNORMAL HIGH (ref 70–99)
Glucose-Capillary: 128 mg/dL — ABNORMAL HIGH (ref 70–99)
Glucose-Capillary: 138 mg/dL — ABNORMAL HIGH (ref 70–99)
Glucose-Capillary: 88 mg/dL (ref 70–99)
Glucose-Capillary: 94 mg/dL (ref 70–99)
Glucose-Capillary: 99 mg/dL (ref 70–99)

## 2020-04-22 MED ORDER — IOHEXOL 300 MG/ML  SOLN
100.0000 mL | Freq: Once | INTRAMUSCULAR | Status: AC | PRN
  Administered 2020-04-22: 100 mL via INTRAVENOUS

## 2020-04-22 MED ORDER — FUROSEMIDE 10 MG/ML IJ SOLN
40.0000 mg | Freq: Four times a day (QID) | INTRAMUSCULAR | Status: DC
  Administered 2020-04-22 – 2020-04-23 (×5): 40 mg via INTRAVENOUS
  Filled 2020-04-22 (×5): qty 4

## 2020-04-22 MED ORDER — ALBUMIN HUMAN 25 % IV SOLN
25.0000 g | Freq: Four times a day (QID) | INTRAVENOUS | Status: DC
  Administered 2020-04-22 – 2020-04-23 (×6): 25 g via INTRAVENOUS
  Filled 2020-04-22 (×7): qty 100

## 2020-04-22 MED ORDER — ALBUMIN HUMAN 25 % IV SOLN: 25.0000 g | Freq: Four times a day (QID) | INTRAVENOUS | Status: DC

## 2020-04-22 NOTE — Progress Notes (Signed)
PROGRESS NOTE    Gina Hooper  UDJ:[id number] DOB: 16-Feb-1991 DOA: 04/04/2020 PCP: Patient, No Pcp Per   Chief Complaint  Patient presents with   Emesis   Hypoglycemia   Brief Narrative: 29 year old female with sudden severe alcohol abuse depression anemia history presented initially 04/04/2020 regarding pain, nausea vomiting, hypoglycemia at home.  In the ED found to have acute liver failure AKI creatinine 1.2 ALP 147 AST more than 10,000 ALT 4566 total bili 4.8 INR 9.9.  Patient was admitted underwent extensive hospitalization further evaluation with GI, treated with MAC, completed 04/07/2020, continued on IV Solu-Medrol, evaluated from a transplant standpoint and deemed not a candidate due to her underlying substance abuse and lack of insurance.  Hospital course complicated by hematemesis on 8/12 due to coagulopathy, felt to be high risk and poor endoscopy candidate and treated with PPI.  Patient developed progressive worsening mentation and became minimally responsive encephalopathic needing intubation 04/06/2020, MRI 8/14 no acute finding, seen by neurology, EEG 8/16 severe diffuse encephalopathy, no seizure, extubated 8/22, transferred to [facility] 8/24.  Pt was placed on Precedex overnight 8/23 due to significant agitation and off  8/24 night. She is waking up more. SLP has placed on diet. PTOT is working with her Patient transfer out of stepdown unit 8/27.  Subjective:  Bladder scan showed urine retention despite Foley catheter , overnight underwent CT abdomen pelvis that showed moderate ascites, anasarca interval increase in the size of bilateral pleural effusion and associated atelectasis or infiltrate. Her weight has also gone up by 33 pounds since admission 147lb> 180. Patient has been initiated and Lasix IV and albumin IV Patient's HR and RR intermittently goes up which makes her  MEWS score higher Reports her abdomen is less distended today, putting out significant amount of  urine  Assessment & Plan:  Acute liver failure/Jaundice in the setting of acute alcohol hepatitis +/- possible Tylenol toxicity/possible HYI:FOYD by GI, was discussed with the transplant team while in ICU and not felt to be a candidate for liver transplant.MELD-na 40,discriminant function 304 on admission.  Total bili remains up at 24,AST ALT and alk phos slowly decreasing,last INR at 1.4.We will continue on current supportive measures with lactulose/rifaximin, multivitamins.On ceftriaxone for possible SBP complete course of 10 days.  Recent Labs  Lab 04/17/20 0515 04/17/20 0515 04/18/20 0301 04/19/20 0219 04/20/20 0304 04/21/20 0357 04/22/20 0400  AST 276*   < > 286* 257* 196* 172* 134*  ALT 314*   < > 314* 268* 244* 209* 166*  ALKPHOS 123   < > 149* 158* 190* 187* 164*  BILITOT 23.7*   < > 26.6* 25.8* 28.3* 26.8* 24.0*  PROT 4.9*   < > 5.3* 5.1* 5.5* 5.3* 5.1*  ALBUMIN 1.9*   < > 2.1* 1.8* 1.8* 1.8* 2.0*  INR 1.5*  --   --   --  1.4*  --   --    < > = values in this interval not displayed.   Acute hepatic/metabolic encephalopathy/acute alcohol withdrawal: Seen by neurology, MRI brain no acute finding EEG with diffuse encephalopathy.  She is alert awake oriented and has significantly improved.  Continue supportive measures, off restraint and off Precedex 8/24 . Cont Lactulose, rifaximin folic acid multivitamin thiamine.  Moderate ascites/anasarca/bilateral pleural effusion:Patient has gained at least 33 pounds since admission.Suspecting due to patient's liver disease, hypoalbuminemia/protein calorie malnutrition.  We will keep on IV Lasix 40 mg every 8 hours along with IV albumin.  Monitor intake output with Foley  catheter, check daily weight.  FEN: keep in low ivf at 30 ml/hr in the setting of ascites anasarca weight gain, continue to augment nutrition.    Hypernatremia/Hypokalemia: Potassium normalized monitor closely while on diuresis   Acute respiratory failure due to liver  failure hepatoencephalopathy needing intubation-on vent for 9 days.  Currently on room air resolved.  Coagulopathy secondary to liver failure. monitor INR.  Upper GI bleed/hematemesis in the setting of gastritis/coagulopathy seen by GI poor endoscopy candidate monitor hb.continue PPI. Anemia acute blood loss less/anemia of critical illness: Monitor hemoglobin.  Transfuse for less than 7 g Recent Labs  Lab 04/18/20 0301 04/19/20 0219 04/20/20 0304 04/21/20 0357 04/22/20 0400  HGB 8.3* 8.4* 8.6* 8.3* 7.4*  HCT 25.2* 24.6* 26.1* 25.3* 23.2*   Thrombocytopenia:in the setting of liver disease and alcohol abuse: Platelet  In 90,000 range.Monitor. Recent Labs  Lab 04/18/20 0301 04/19/20 0219 04/20/20 0304 04/21/20 0357 04/22/20 0400  PLT 74* 73* 74* 97* 96*   Moderate protein calorie malnutrition:Continue to augment nutrition.  CoNS in blood cx 1 bottle-likely contamination.  Leukocytosis WBC downtrending,On rocephin for sbp. Recent Labs  Lab 04/18/20 0301 04/19/20 0219 04/20/20 0304 04/21/20 0357 04/22/20 0400  WBC 15.5* 14.9* 14.3* 13.3* 12.2*   Deconditioning:PT OT consult and mobilize.CIR consulted for inpatient rehab.    Foley + patient has Foley back in place for diuresis.  Was discontinued 8/27 and placed back in 8/28   DVT prophylaxis: SCDs Start: 04/04/20 1659 Code Status:   Code Status: Full Code  Family Communication: Plan of care discussed with patient at bedside. Updated her father. He reports he is involved in her care and will take her to his home in [place] after discharge and not planning her to return to her grandmother and prefers CIR.  Status [id number]. Remains inpatient appropriate because:IV treatments appropriate due to intensity of illness or inability to take PO and Inpatient level of care appropriate due to severity of illness Dispo: The patient is from: Home              Anticipated d/c is to: CIR,consulted.              Anticipated d/c  date is: > 3 days              Patient currently is not medically stable to d/c. Nutrition: Diet Order            DIET DYS 3 Room service appropriate? Yes with Assist; Fluid consistency: Thin  Diet effective now                 Nutrition Problem: Inadequate oral intake Etiology: inability to eat Signs/Symptoms: NPO status Interventions: Refer to RD note for recommendations Body mass index is 29.15 kg/m.  Consultants:see note  Procedures: UDS 8/11 >> opiate / THC / cocaine positive  CT ABD 8/11 >> borderline hepatomegaly with diffuse patchy low-attenuation with concern for acute inflammation/hepatitis.  Gallbladder wall thickening with pericholecystic fluid felt reactive there were no gallstones.  There is a small right effusion.  Mild ascites in the pelvis. MRI Brain 8/14 >> no acute intracranial process. Mild pansinus disease  EEG 8/16 >> suggestive of severe diffuse encephalopathy, non-specific etiology ECHO 8/19 >> LVEF 65-70%, no RWMA, RV systolic function normal LE Venous Duplex 8/19 >> negative  CT ABD/PELVIS 04/22/20 1.Moderate ascites and anasarca, new since the prior CT. 2. Interval increase in the size of bilateral pleural effusion and associated atelectasis or infiltrate. 3. Thickened appearance of  the wall of the stomach and loops of small bowel, likely related to ascites. Clinical correlation is recommended to evaluate for possibility of gastroenteritis. No bowel obstruction. 4. Uterine fibroid. 5. A 2 cm right ovarian dominant follicle or cyst  Microbiology:see note Blood Culture    Component Value Date/Time   SDES  01/14/2018 0005    BLOOD RIGHT FOREARM Performed at [facility], [address] [place], [place] [address]    [facility]  01/14/2018 0005    BOTTLES DRAWN AEROBIC AND ANAEROBIC Blood Culture adequate volume Performed at [facility], [address] [place], [place] [address]    CULT (A) 01/14/2018 0005     STAPHYLOCOCCUS SPECIES (COAGULASE NEGATIVE) THE SIGNIFICANCE OF ISOLATING THIS ORGANISM FROM A SINGLE SET OF BLOOD CULTURES WHEN MULTIPLE SETS ARE DRAWN IS UNCERTAIN. PLEASE NOTIFY THE MICROBIOLOGY DEPARTMENT WITHIN ONE WEEK IF SPECIATION AND SENSITIVITIES ARE REQUIRED. Performed at [facility], [place] [address] [place], [place] [address]    REPTSTATUS 01/17/2018 FINAL 01/14/2018 0005    Other culture-see note  Medications: Scheduled Meds:  Chlorhexidine Gluconate Cloth  6 each Topical Daily   feeding supplement (ENSURE ENLIVE)  237 mL Oral TID BM   folic acid  1 mg Intravenous Daily   furosemide  40 mg Intravenous Q6H   insulin aspart  0-6 Units Subcutaneous Q4H   lactulose  30 g Per Tube Daily   mouth rinse  15 mL Mouth Rinse BID   multivitamin  15 mL Per Tube q1800   pantoprazole (PROTONIX) IV  40 mg Intravenous Q24H   rifaximin  550 mg Oral BID   sodium chloride (PF)       sodium chloride flush  10-40 mL Intracatheter Q12H   sodium chloride flush  10-40 mL Intracatheter Q12H   tamsulosin  0.4 mg Oral QPC supper   thiamine  100 mg Per Tube Daily   Or   thiamine  100 mg Intravenous Daily   Continuous Infusions:  albumin human 25 g (04/22/20 1013)   cefTRIAXone (ROCEPHIN)  IV 2 g (04/21/20 1136)   dextrose 5% lactated ringers 30 mL/hr at 04/22/20 0830    Antimicrobials: Anti-infectives (From admission, onward)   Start     Dose/Rate Route Frequency Ordered Stop   04/18/20 2215  rifaximin (XIFAXAN) tablet 550 mg        550 mg Oral 2 times daily 04/18/20 2208     04/15/20 1000  rifaximin (XIFAXAN) tablet 550 mg  Status:  Discontinued        550 mg Per Tube 2 times daily 04/15/20 0856 04/18/20 2209   04/12/20 1100  rifaximin (XIFAXAN) tablet 550 mg  Status:  Discontinued        550 mg Per Tube 2 times daily 04/12/20 1024 04/15/20 0856   04/12/20 1100  cefTRIAXone (ROCEPHIN) 2 g in sodium chloride 0.9 % 100 mL IVPB        2 g 200 mL/hr over 30 Minutes  Intravenous Every 24 hours 04/12/20 1044         Objective: Vitals: Today's Vitals   04/22/20 0437 04/22/20 0953 04/22/20 1000 04/22/20 1035  BP: 119/76 (!) 138/95  128/87  Pulse: 99 (!) 111 (!) 108 (!) 103  Resp: 20 18  (!) 24  Temp: 98.6 F (37 C) 98.3 F (36.8 C)  98.3 F (36.8 C)  TempSrc: Oral Oral    SpO2: 97% 92%  94%  Weight: 81.9 kg  Height:      °PainSc:      ° ° °Intake/Output Summary (Last 24 hours) at 04/22/2020 1127 °Last data filed at 04/22/2020 1000 °Gross per 24 hour  °Intake 1306.66 ml  °Output 3820 ml  °Net -2513.34 ml  ° °Filed Weights  ° 04/19/20 0433 04/21/20 0500 04/22/20 0437  °Weight: 78.5 kg 81.8 kg 81.9 kg  ° °Weight change: 0.12 kg  ° °Intake/Output from previous day: °08/28 0701 - 08/29 0700 °In: 1306.7 [I.V.:1106.7; IV Piggyback:50] °Out: 1970 [Urine:1970] °Intake/Output this shift: °Total I/O °In: -  °Out: 1850 [Urine:1850] ° °Examination: ° °General exam: AAOx3 , NAD, weak appearing. °HEENT:Oral mucosa moist, Ear/Nose WNL grossly, dentition normal.  Icteric skin and conjunctiva °Respiratory system: bilaterally clear,no wheezing or crackles,no use of accessory muscle °Cardiovascular system: S1 & S2 +, No JVD,. °Gastrointestinal system: Abdomen soft, mildly distended ,ND, BS+ °Nervous System:Alert, awake, moving extremities and grossly nonfocal °Extremities: Bilateral ankle edema, distal peripheral pulses palpable.  °Skin: No rashes,no icterus. °MSK: Normal muscle bulk,tone, power ° ° ° °Data Reviewed: I have personally reviewed following labs and imaging studies °CBC: °Recent Labs  °Lab 04/18/20 °0301 04/19/20 °0219 04/20/20 °0304 04/21/20 °0357 04/22/20 °0400  °WBC 15.5* 14.9* 14.3* 13.3* 12.2*  °NEUTROABS 12.2* 11.8* 11.6* 11.2* 10.0*  °HGB 8.3* 8.4* 8.6* 8.3* 7.4*  °HCT 25.2* 24.6* 26.1* 25.3* 23.2*  °MCV 85.7 84.0 87.3 88.2 89.9  °PLT 74* 73* 74* 97* 96*  ° °Basic Metabolic Panel: °Recent Labs  °Lab 04/17/20 °0515 04/17/20 °0515 04/18/20 °0301 04/18/20 °0301  04/19/20 °0219 04/20/20 °0304 04/21/20 °0357 04/21/20 °1715 04/22/20 °0400  °NA 150*   < > 142  --  137 135 135  --  136  °K 3.5   < > 3.1*   < > 3.6 3.2* 2.9* 3.4* 3.8  °CL 118*   < > 111  --  110 109 107  --  107  °CO2 25   < > 21*  --  19* 19* 21*  --  20*  °GLUCOSE 115*   < > 109*  --  97 98 121*  --  99  °BUN 21*   < > 18  --  14 12 12  --  11  °CREATININE 0.72   < > 0.72  --  <0.30* 0.41* 0.68  --  0.78  °CALCIUM 8.1*   < > 8.0*  --  7.8* 7.9* 8.0*  --  8.2*  °MG 2.3  --   --   --   --   --   --   --   --   °PHOS 3.4  --  3.4  --  3.4 3.3  --   --   --   ° < > = values in this interval not displayed.  ° °GFR: °Estimated Creatinine Clearance: 111.9 mL/min (by C-G formula based on SCr of 0.78 mg/dL). °Liver Function Tests: °Recent Labs  °Lab 04/18/20 °0301 04/19/20 °0219 04/20/20 °0304 04/21/20 °0357 04/22/20 °0400  °AST 286* 257* 196* 172* 134*  °ALT 314* 268* 244* 209* 166*  °ALKPHOS 149* 158* 190* 187* 164*  °BILITOT 26.6* 25.8* 28.3* 26.8* 24.0*  °PROT 5.3* 5.1* 5.5* 5.3* 5.1*  °ALBUMIN 2.1* 1.8* 1.8* 1.8* 2.0*  ° °No results for input(s): LIPASE, AMYLASE in the last 168 hours. °Recent Labs  °Lab 04/16/20 °1554  °AMMONIA 24  ° °Coagulation Profile: °Recent Labs  °Lab 04/17/20 °0515 04/20/20 °0304  °INR 1.5* 1.4*  ° °Cardiac Enzymes: °No results for input(s): CKTOTAL,   CKMB, CKMBINDEX, TROPONINI in the last 168 hours. BNP (last 3 results) No results for input(s): PROBNP in the last 8760 hours. HbA1C: No results for input(s): HGBA1C in the last 72 hours. CBG: Recent Labs  Lab 04/21/20 2007 04/22/20 0004 04/22/20 0433 04/22/20 0740 04/22/20 1100  GLUCAP 110* 116* 99 88 138*   Lipid Profile: No results for input(s): CHOL, HDL, LDLCALC, TRIG, CHOLHDL, LDLDIRECT in the last 72 hours. Thyroid Function Tests: No results for input(s): TSH, T4TOTAL, FREET4, T3FREE, THYROIDAB in the last 72 hours. Anemia Panel: No results for input(s): VITAMINB12, FOLATE, FERRITIN, TIBC, IRON, RETICCTPCT in the last  72 hours. Sepsis Labs: Recent Labs  Lab 04/17/20 0515  LATICACIDVEN 1.2    No results found for this or any previous visit (from the past 240 hour(s)).    Radiology Studies: CT ABDOMEN PELVIS W CONTRAST  Result Date: 04/22/2020 CLINICAL DATA:  29 year old female with abdominal distension. No urine output. EXAM: CT ABDOMEN AND PELVIS WITH CONTRAST TECHNIQUE: Multidetector CT imaging of the abdomen and pelvis was performed using the standard protocol following bolus administration of intravenous contrast. CONTRAST:  171m OMNIPAQUE IOHEXOL 300 MG/ML  SOLN COMPARISON:  CT dated 04/04/2020. FINDINGS: Lower chest: Partially visualized small bilateral pleural effusions with associated bibasilar compressive atelectasis versus infiltrate. Interval increase in the size of the pleural effusion and associated pulmonary densities since the prior CT. No intra-abdominal free air. There is moderate ascites, new since the prior CT. Hepatobiliary: The liver is slightly heterogeneous, otherwise unremarkable. No intrahepatic biliary ductal dilatation. No calcified gallstone. Pancreas: The pancreas is unremarkable. Evaluation for pancreatitis or pancreatic inflammation is limited due to ascites. Spleen: Normal in size without focal abnormality. Adrenals/Urinary Tract: The adrenal glands unremarkable. There is no hydronephrosis on either side. There is symmetric enhancement and excretion of contrast by both kidneys. The visualized ureters appear unremarkable. The urinary bladder is decompressed around a Foley catheter. Stomach/Bowel: There is a small hiatal hernia. Diffuse thickened appearance of the wall of the stomach and loops of small bowel, likely related to ascites. Clinical correlation is recommended to evaluate for possibility of gastroenteritis. There is no bowel obstruction. No CT findings of acute appendicitis. Vascular/Lymphatic: The abdominal aorta and IVC unremarkable. No portal venous gas. There is no  adenopathy. Reproductive: There is a 4.5 cm posterior uterine fibroid. There is a 2 cm right ovarian dominant follicle or cyst. Other: Diffuse subcutaneous edema and anasarca. Musculoskeletal: No acute or significant osseous findings. IMPRESSION: 1. Moderate ascites and anasarca, new since the prior CT. 2. Interval increase in the size of bilateral pleural effusion and associated atelectasis or infiltrate. 3. Thickened appearance of the wall of the stomach and loops of small bowel, likely related to ascites. Clinical correlation is recommended to evaluate for possibility of gastroenteritis. No bowel obstruction. 4. Uterine fibroid. 5. A 2 cm right ovarian dominant follicle or cyst. Electronically Signed   By: AAnner CreteM.D.   On: 04/22/2020 00:44     LOS: 18 days   RAntonieta Pert MD [facility]  04/22/2020, 11:27 AM

## 2020-04-22 NOTE — Progress Notes (Signed)
Inpatient Rehab Admissions:  Inpatient Rehab Consult received.  I called patient at [facility] for rehabilitation assessment and to discuss goals and expectations of an inpatient rehab admission.  Pt wanted me to call her grandmother, Wynelle Bourgeois to discuss program with her. Ms. Loleta Chance acknowledged understanding and interested in CIR for pt.  However, pt is uninsured and grandmother would like her to apply for Medicaid.  She would also prefer if pt received therapy after receives Medicaid.  Informed Ms. Hill that the application process can be lengthy.  She requested to talk with TOC; notified Sharol Roussel.  Also contacted financial counselor, Glory Rosebush; left message on her voicemail.  Will continue to monitor pt's progess with therapies and medical workup.    Signed: Wolfgang Phoenix, MS, CCC-SLP Admissions Coordinator [phone]

## 2020-04-22 NOTE — Progress Notes (Signed)
Vital signs not taken at a resting state. Rechecked at resting state

## 2020-04-22 NOTE — Plan of Care (Signed)

## 2020-04-22 NOTE — Progress Notes (Signed)
Urine output of dark amber urine @ 1015pm.  Bladder scan done showing 805 and 867 relayed to MD with new orders to remove current foley and replace.  Current foley catheter removed per orders and New 34fr foley catheter inserted per policy and secured to left thigh with securing device and a return of 12ml of dark amber urine.  Asked patient if she felt the urge to void.  She states she didn't feel the need and also states that her abdomen is normally rounded.  Completed another bladder scan with a range showing greater than 499 to greater than 987.  Also noted patient has 1-2+ generalized edema from her flank area down to her feet.  This information was also relayed to MD and NP with new orders for CT SCAN.

## 2020-04-22 NOTE — TOC Progression Note (Signed)
Transition of Care [facility]) - Progression Note    Patient Details  Name: Gina Hooper MRN: [id number] Date of Birth: [birth year]  [facility] CM/SW Contact  Armanda Heritage, RN Phone Number: 04/22/2020, 12:38 PM  Clinical Narrative:    CM spoke with patient's grandmother who had questions regarding applying for medicaid for patient.  Per CIR rep a referral has been made to [facility] to assist with this.   Grandmother states she does not feel she can afford the cost of CIR without medicaid.  CM spoke with grandmother regarding dc planning and that medicaid application processing will take time. Patient likely to be medically stable for discharge before a response from medicaid is received.  Grandmother is in agreement for patient to return home with charity [facility] services as a possible dc disposition.     Expected Discharge Plan: Home w Home Health Services Barriers to Discharge: Continued Medical Work up  Expected Discharge Plan and Services Expected Discharge Plan: Home w Home Health Services In-house Referral: Financial Counselor Discharge Planning Services: CM Consult   Living arrangements for the past 2 months: Single Family Home                                       Social Determinants of Health (SDOH) Interventions    Readmission Risk Interventions No flowsheet data found.

## 2020-04-22 NOTE — Progress Notes (Signed)
After 1st dose of albumin and lasix 40mg  IV patient diuresed clear yellow urine

## 2020-04-22 NOTE — Progress Notes (Signed)
Occupational Therapy Treatment Patient Details Name: Gina Hooper MRN: [id number] DOB: 1991-06-04 Today's Date: 04/22/2020    History of present illness Pt admitted with acute encephalopathy and noted to be in acute liver failure in the setting of Alchoholic Hepatitis with profound coagulopathy and hematemesis. ETT x9 days. Pt with hx of ETOH and polysubstance abuse as well as osteomylitis of L ankle   OT comments  Treatment focused on patient improving functional mobility and activity tolerance in preparation for participation in ADLs. Patient mod assist to transfer to side of bed, mod assist to stand from elevated bed height and min guard to take steps to recliner with RW. Patient performed grooming task seated in recliner with setup. Patient also needed set up for meal - as when she attempted to grasp food it fell to the floor. Patient limited by back pain after transferring to recliner and therapist notified RN of patient's request for pain medicine. Patient's mobility has progressed. Cont POC. Patient may be a good candidate for CIR due to her age, prior independence, and good progress thus far.   Follow Up Recommendations  CIR    Equipment Recommendations  Other (comment) TBD   Recommendations for Other Services      Precautions / Restrictions Precautions Precautions: Fall Restrictions Weight Bearing Restrictions: No       Mobility Bed Mobility Overal bed mobility: Needs Assistance Bed Mobility: Supine to Sit     Supine to sit: Mod assist     General bed mobility comments: Patient able to get her legs over to side of but needed mod assist for trunk lift off of bed. Patient able to incrementally scoot to edge of bed with rest breaks.  Transfers Overall transfer level: Needs assistance Equipment used: Rolling walker (2 wheeled) Transfers: Stand Pivot Transfers;Sit to/from Stand Sit to Stand: Mod assist;From elevated surface Stand pivot transfers: Min guard        General transfer comment: Mod assist for power up from bed. Min guard to take steps to recliner with RW.    Balance Overall balance assessment: Needs assistance Sitting-balance support: No upper extremity supported;Feet supported Sitting balance-Leahy Scale: Fair Sitting balance - Comments: at edge of bed   Standing balance support: During functional activity;Bilateral upper extremity supported Standing balance-Leahy Scale: Poor Standing balance comment: reliant on RW in standing                           ADL either performed or assessed with clinical judgement   ADL Overall ADL's : Needs assistance/impaired Eating/Feeding: Set up;Sitting Eating/Feeding Details (indicate cue type and reason): Patient required set up of food. Grooming: Therapist, nutritional;Wash/dry hands;Sitting;Set up Grooming Details (indicate cue type and reason): Patient performed grooming task seated in recliner prior to meal.                                     Vision   Vision Assessment?: No apparent visual deficits   Perception     Praxis      Cognition Arousal/Alertness: Awake/alert Behavior During Therapy: WFL for tasks assessed/performed;Impulsive Overall Cognitive Status: Within Functional Limits for tasks assessed                                          Exercises  Shoulder Instructions       General Comments      Pertinent Vitals/ Pain       Pain Score: 8  Pain Location: low back Pain Descriptors / Indicators: Discomfort;Grimacing;Restless Pain Intervention(s): Monitored during session;Limited activity within patient's tolerance  Home Living                                          Prior Functioning/Environment              Frequency  Min 2X/week        Progress Toward Goals  OT Goals(current goals can now be found in the care plan section)  Progress towards OT goals: Progressing toward goals  Acute Rehab  OT Goals Patient Stated Goal: walk more OT Goal Formulation: With patient Time For Goal Achievement: 04/30/20 Potential to Achieve Goals: Good  Plan Discharge plan remains appropriate;Discharge plan needs to be updated    Co-evaluation                 AM-PAC OT "6 Clicks" Daily Activity     Outcome Measure   Help from another person eating meals?: A Little Help from another person taking care of personal grooming?: A Little Help from another person toileting, which includes using toliet, bedpan, or urinal?: Total Help from another person bathing (including washing, rinsing, drying)?: A Lot Help from another person to put on and taking off regular upper body clothing?: A Lot Help from another person to put on and taking off regular lower body clothing?: A Lot 6 Click Score: 13    End of Session Equipment Utilized During Treatment: Gait belt;Rolling walker  OT Visit Diagnosis: Other abnormalities of gait and mobility (R26.89);Muscle weakness (generalized) (M62.81);Other symptoms and signs involving cognitive function   Activity Tolerance Patient limited by fatigue;Patient limited by pain   Patient Left in chair;with call bell/phone within reach;with chair alarm set;with family/visitor present   Nurse Communication Mobility status;Patient requests pain meds        Time: 1414-1433 OT Time Calculation (min): 19 min  Charges: OT General Charges $OT Visit: 1 Visit OT Treatments $Therapeutic Activity: 8-22 mins  Gina Hooper Session, OTR/L Acute Care Rehab Services  Office [phone] Pager: [phone]    Gina Hooper 04/22/2020, 4:19 PM

## 2020-04-22 NOTE — Progress Notes (Signed)
Patient taken via bed to CT Scan for CT of ABD/Pelvis and returned to floor at approx 1240am.

## 2020-04-23 ENCOUNTER — Inpatient Hospital Stay (HOSPITAL_COMMUNITY): Payer: Medicaid Other

## 2020-04-23 DIAGNOSIS — M7989 Other specified soft tissue disorders: Secondary | ICD-10-CM

## 2020-04-23 DIAGNOSIS — K7201 Acute and subacute hepatic failure with coma: Secondary | ICD-10-CM | POA: Diagnosis not present

## 2020-04-23 LAB — COMPREHENSIVE METABOLIC PANEL
ALT: 104 U/L — ABNORMAL HIGH (ref 0–44)
AST: 97 U/L — ABNORMAL HIGH (ref 15–41)
Albumin: 3.8 g/dL (ref 3.5–5.0)
Alkaline Phosphatase: 119 U/L (ref 38–126)
Anion gap: 13 (ref 5–15)
BUN: 9 mg/dL (ref 6–20)
CO2: 29 mmol/L (ref 22–32)
Calcium: 8.4 mg/dL — ABNORMAL LOW (ref 8.9–10.3)
Chloride: 92 mmol/L — ABNORMAL LOW (ref 98–111)
Creatinine, Ser: 0.64 mg/dL (ref 0.44–1.00)
GFR calc Af Amer: >60 mL/min (ref >60)
GFR calc non Af Amer: >60 mL/min (ref >60)
Glucose, Bld: 90 mg/dL (ref 70–99)
Potassium: 2.1 mmol/L — CL (ref 3.5–5.1)
Sodium: 134 mmol/L — ABNORMAL LOW (ref 135–145)
Total Bilirubin: 27.7 mg/dL (ref 0.3–1.2)
Total Protein: 6.2 g/dL — ABNORMAL LOW (ref 6.5–8.1)

## 2020-04-23 LAB — CBC WITH DIFFERENTIAL/PLATELET
Abs Immature Granulocytes: 0.19 10*3/uL — ABNORMAL HIGH (ref 0.00–0.07)
Basophils Absolute: 0 10*3/uL (ref 0.0–0.1)
Basophils Relative: 0 %
Eosinophils Absolute: 0.1 10*3/uL (ref 0.0–0.5)
Eosinophils Relative: 1 %
HCT: 21 % — ABNORMAL LOW (ref 36.0–46.0)
Hemoglobin: 7 g/dL — ABNORMAL LOW (ref 12.0–15.0)
Immature Granulocytes: 2 %
Lymphocytes Relative: 12 %
Lymphs Abs: 1.5 10*3/uL (ref 0.7–4.0)
MCH: 29.8 pg (ref 26.0–34.0)
MCHC: 33.3 g/dL (ref 30.0–36.0)
MCV: 89.4 fL (ref 80.0–100.0)
Monocytes Absolute: 0.6 10*3/uL (ref 0.1–1.0)
Monocytes Relative: 5 %
Neutro Abs: 10.3 10*3/uL — ABNORMAL HIGH (ref 1.7–7.7)
Neutrophils Relative %: 80 %
Platelets: 89 10*3/uL — ABNORMAL LOW (ref 150–400)
RBC: 2.35 MIL/uL — ABNORMAL LOW (ref 3.87–5.11)
WBC: 12.7 10*3/uL — ABNORMAL HIGH (ref 4.0–10.5)
nRBC: 0 % (ref 0.0–0.2)

## 2020-04-23 LAB — GLUCOSE, CAPILLARY
Glucose-Capillary: 107 mg/dL — ABNORMAL HIGH (ref 70–99)
Glucose-Capillary: 89 mg/dL (ref 70–99)
Glucose-Capillary: 158 mg/dL — ABNORMAL HIGH (ref 70–99)
Glucose-Capillary: 107 mg/dL — ABNORMAL HIGH (ref 70–99)
Glucose-Capillary: 115 mg/dL — ABNORMAL HIGH (ref 70–99)

## 2020-04-23 LAB — HEMOGLOBIN AND HEMATOCRIT, BLOOD
HCT: 22 % — ABNORMAL LOW (ref 36.0–46.0)
Hemoglobin: 7.1 g/dL — ABNORMAL LOW (ref 12.0–15.0)

## 2020-04-23 LAB — MAGNESIUM: Magnesium: 1.1 mg/dL — ABNORMAL LOW (ref 1.7–2.4)

## 2020-04-23 LAB — POTASSIUM: Potassium: 2.5 mmol/L — CL (ref 3.5–5.1)

## 2020-04-23 LAB — PHOSPHORUS: Phosphorus: 3.9 mg/dL (ref 2.5–4.6)

## 2020-04-23 MED ORDER — POTASSIUM CHLORIDE CRYS ER 20 MEQ PO TBCR
40.0000 meq | EXTENDED_RELEASE_TABLET | ORAL | Status: AC
  Administered 2020-04-23 (×2): 40 meq via ORAL
  Filled 2020-04-23 (×2): qty 2

## 2020-04-23 MED ORDER — POTASSIUM CHLORIDE 10 MEQ/100ML IV SOLN
10.0000 meq | INTRAVENOUS | Status: AC
  Administered 2020-04-23 (×4): 10 meq via INTRAVENOUS
  Filled 2020-04-23 (×4): qty 100

## 2020-04-23 MED ORDER — LACTULOSE 10 GM/15ML PO SOLN
30.0000 g | Freq: Every day | ORAL | Status: DC
  Administered 2020-04-24 – 2020-05-01 (×7): 30 g via ORAL
  Filled 2020-04-23 (×8): qty 60

## 2020-04-23 MED ORDER — MAGNESIUM OXIDE 400 (241.3 MG) MG PO TABS
400.0000 mg | ORAL_TABLET | Freq: Two times a day (BID) | ORAL | Status: AC
  Administered 2020-04-23 – 2020-04-25 (×5): 400 mg via ORAL
  Filled 2020-04-23 (×6): qty 1

## 2020-04-23 MED ORDER — FUROSEMIDE 10 MG/ML IJ SOLN: 40.0000 mg | Freq: Every day | INTRAMUSCULAR | Status: DC

## 2020-04-23 MED ORDER — THIAMINE HCL 100 MG PO TABS
100.0000 mg | ORAL_TABLET | Freq: Every day | ORAL | Status: DC
  Administered 2020-04-24 – 2020-05-01 (×8): 100 mg via ORAL
  Filled 2020-04-23 (×8): qty 1

## 2020-04-23 MED ORDER — PANTOPRAZOLE SODIUM 40 MG PO TBEC
40.0000 mg | DELAYED_RELEASE_TABLET | Freq: Every day | ORAL | Status: DC
  Administered 2020-04-24 – 2020-05-01 (×8): 40 mg via ORAL
  Filled 2020-04-23 (×8): qty 1

## 2020-04-23 MED ORDER — MAGNESIUM SULFATE 4 GM/100ML IV SOLN
4.0000 g | Freq: Once | INTRAVENOUS | Status: AC
  Administered 2020-04-23: 4 g via INTRAVENOUS
  Filled 2020-04-23: qty 100

## 2020-04-23 MED ORDER — FOLIC ACID 1 MG PO TABS
1.0000 mg | ORAL_TABLET | Freq: Every day | ORAL | Status: DC
  Administered 2020-04-24 – 2020-05-01 (×8): 1 mg via ORAL
  Filled 2020-04-23 (×8): qty 1

## 2020-04-23 MED ORDER — FUROSEMIDE 10 MG/ML IJ SOLN: 40.0000 mg | Freq: Two times a day (BID) | INTRAMUSCULAR | Status: DC

## 2020-04-23 MED ORDER — POTASSIUM CHLORIDE CRYS ER 20 MEQ PO TBCR
40.0000 meq | EXTENDED_RELEASE_TABLET | ORAL | Status: AC
  Administered 2020-04-23 – 2020-04-24 (×2): 40 meq via ORAL
  Filled 2020-04-23 (×2): qty 2

## 2020-04-23 MED ORDER — POTASSIUM CHLORIDE 10 MEQ/100ML IV SOLN
10.0000 meq | INTRAVENOUS | Status: AC
  Administered 2020-04-23 – 2020-04-24 (×4): 10 meq via INTRAVENOUS
  Filled 2020-04-23 (×4): qty 100

## 2020-04-23 MED ORDER — ADULT MULTIVITAMIN W/MINERALS CH
1.0000 | ORAL_TABLET | Freq: Every day | ORAL | Status: DC
  Administered 2020-04-23 – 2020-05-01 (×9): 1 via ORAL
  Filled 2020-04-23 (×9): qty 1

## 2020-04-23 MED ORDER — THIAMINE HCL 100 MG/ML IJ SOLN: 100.0000 mg | Freq: Every day | INTRAMUSCULAR | Status: DC

## 2020-04-23 NOTE — Progress Notes (Signed)
CRITICAL VALUE ALERT  Critical Value:  K 2.5  Date & Time Notied:  04/23/20 1956  Provider Notified: Katherina Right, NP  Orders Received/Actions taken: Awaiting new orders.

## 2020-04-23 NOTE — Progress Notes (Signed)
SLP Cancellation Note  Patient Details Name: KYANNE RIALS MRN: [id number] DOB: 06/24/1991   Cancelled treatment:       Reason Eval/Treat Not Completed: Other (comment) (pt sleeping and did not awaken to gentle verbal stimulation, will continue efforts)  Rolena Infante, MS [facility] SLP Acute Rehab Services Office [phone]  Chales Abrahams 04/23/2020, 12:40 PM

## 2020-04-23 NOTE — Plan of Care (Signed)

## 2020-04-23 NOTE — Progress Notes (Signed)
PROGRESS NOTE    Gina Hooper  EXN:[id number] DOB: [birth year] DOA: 04/04/2020 PCP: Patient, No Pcp Per   Chief Complaint  Patient presents with  . Emesis  . Hypoglycemia   Brief Narrative: 29 year old female with sudden severe alcohol abuse depression anemia history presented initially 04/04/2020 regarding pain, nausea vomiting, hypoglycemia at home.  In the ED found to have acute liver failure AKI creatinine 1.2 ALP 147 AST more than 10,000 ALT 4566 total bili 4.8 INR 9.9.  Patient was admitted underwent extensive hospitalization further evaluation with GI, treated with MAC, completed 04/07/2020, continued on IV Solu-Medrol, evaluated from a transplant standpoint and deemed not a candidate due to her underlying substance abuse and lack of insurance.  Hospital course complicated by hematemesis on 8/12 due to coagulopathy, felt to be high risk and poor endoscopy candidate and treated with PPI.  Patient developed progressive worsening mentation and became minimally responsive encephalopathic needing intubation 04/06/2020, MRI 8/14 no acute finding, seen by neurology, EEG 8/16 severe diffuse encephalopathy, no seizure, extubated 8/22, transferred to [facility] 8/24.  Pt was placed on Precedex overnight 8/23 due to significant agitation and off  8/24 night. She is waking up more. SLP has placed on diet. PTOT is working with her Patient transfer out of stepdown unit 8/27.  Subjective: Abdomen feels less hard today,no nausea or vomiting and had regular BM. No belly pain or chest pain Sat on the bedside chair for an hour. Lost 15 lb with diuresis overnight, uop 12400 ml- cut down lasix this am, bmp, mag phos pending No acute events overnight hb at 7.0  Assessment & Plan:  Acute liver failure/Jaundice in the setting of acute alcohol hepatitis +/- possible Tylenol toxicity/possible WHQ:PRFF by GI, was discussed with the transplant team while in ICU and not felt to be a candidate for liver  transplant.MELD-na 40,discriminant function 304 on admission.  LFTs continue to downtrend, patient is more alert awake ,last INR at 1.4.continue current supportive measures.  Patient completed ceftriaxone for possible SBP.    Recent Labs  Lab 04/17/20 0515 04/17/20 0515 04/18/20 0301 04/19/20 0219 04/20/20 0304 04/21/20 0357 04/22/20 0400  AST 276*   < > 286* 257* 196* 172* 134*  ALT 314*   < > 314* 268* 244* 209* 166*  ALKPHOS 123   < > 149* 158* 190* 187* 164*  BILITOT 23.7*   < > 26.6* 25.8* 28.3* 26.8* 24.0*  PROT 4.9*   < > 5.3* 5.1* 5.5* 5.3* 5.1*  ALBUMIN 1.9*   < > 2.1* 1.8* 1.8* 1.8* 2.0*  INR 1.5*  --   --   --  1.4*  --   --    < > = values in this interval not displayed.   Acute hepatic/metabolic encephalopathy/acute alcohol withdrawal: Seen by neurology, MRI brain no acute finding EEG with diffuse encephalopathy.  She is alert awake oriented and has significantly improved.  Continue supportive measures, off restraint and off Precedex 8/24 . Cont Lactulose, rifaximin folic acid multivitamin thiamine.  Moderate ascites/anasarca/bilateral pleural effusion:Patient has gained at least 33 pounds since admission.Suspecting due to patient's liver disease, hypoalbuminemia/protein calorie malnutrition.  Patient diuresed with IV albumin and Lasix every 6 hours, significant urine output and weight loss about 15 pound past 24 hours stop further IV Lasix, replete electrolytes.   Hypokalemia: Due to diuresis, oral potassium 40 M EQ every 3 hours x2 doses and IV potassium 40 M EQ total, repeat BMP today.    Hypomagnesemia ordered 4 g replacement with  oral magnesium  FEN: keep at low ivf at 30 ml/hr .  Encourage oral hydration. D.c soon  Hypernatremia: resovedl  Acute respiratory failure due to liver failure hepatoencephalopathy needing intubation-on vent for 9 days.  On room air currently.  Coagulopathy secondary to liver failure. monitor INR.  Appears overall stable  Upper GI  bleed/hematemesis in the setting of gastritis/coagulopathy seen by GI poor endoscopy candidate monitor hb.continue PPI. Anemia acute blood loss less/anemia of critical illness: Monitor hemoglobin.  Transfuse for less than 7 g currently at 7.0 g.  Gina repeat CBC later.  Discussed with patient regarding blood transfusion if less than 7 g, she voiced understanding or respiratory negative and agreeable if needed Recent Labs  Lab 04/19/20 0219 04/20/20 0304 04/21/20 0357 04/22/20 0400 04/23/20 0417  HGB 8.4* 8.6* 8.3* 7.4* 7.0*  HCT 24.6* 26.1* 25.3* 23.2* 21.0*   Thrombocytopenia:in the setting of liver disease and alcohol abuse: Platelet overall stable but low.  Recent Labs  Lab 04/19/20 0219 04/20/20 0304 04/21/20 0357 04/22/20 0400 04/23/20 0417  PLT 73* 74* 97* 96* 89*   Moderate protein calorie malnutrition: Continue to increase diet to augment nutrition.  CoNS in blood cx 1 bottle-likely contamination.  Leukocytosis WBC borderline high, completed antibiotics.  Recent Labs  Lab 04/19/20 0219 04/20/20 0304 04/21/20 0357 04/22/20 0400 04/23/20 0417  WBC 14.9* 14.3* 13.3* 12.2* 12.7*   Deconditioning:PT/OT consulted , continue to mobilize her.    Foley + patient has Foley back in place for diuresis.  Was discontinued 8/27 and placed back in 8/28  DVT prophylaxis: SCDs Start: 04/04/20 1659 Code Status:   Code Status: Full Code  Family Communication: Plan of care discussed with patient at bedside. Updated her father 8/29-he reports he is involved in her care and Gina take her to his home in [place] after discharge and not planning her to return to her grandmother and prefers CIR. sister at the bedside updated 8/30.  Status TT:[id number]. Remains inpatient appropriate because:IV treatments appropriate due to intensity of illness or inability to take PO and Inpatient level of care appropriate due to severity of illness Dispo: The patient is from: Home               Anticipated d/c is to: CIR,consulted.              Anticipated d/c date is: > 3 days              Patient currently is not medically stable to d/c. Nutrition: Diet Order            DIET DYS 3 Room service appropriate? Yes with Assist; Fluid consistency: Thin  Diet effective now                 Nutrition Problem: Inadequate oral intake Etiology: inability to eat Signs/Symptoms: NPO status Interventions: Refer to RD note for recommendations Body mass index is 26.72 kg/m.  Consultants:see note  Procedures: UDS 8/11 >> opiate / THC / cocaine positive  CT ABD 8/11 >> borderline hepatomegaly with diffuse patchy low-attenuation with concern for acute inflammation/hepatitis.  Gallbladder wall thickening with pericholecystic fluid felt reactive there were no gallstones.  There is a small right effusion.  Mild ascites in the pelvis. MRI Brain 8/14 >> no acute intracranial process. Mild pansinus disease  EEG 8/16 >> suggestive of severe diffuse encephalopathy, non-specific etiology ECHO 8/19 >> LVEF 65-70%, no RWMA, RV systolic function normal LE Venous Duplex 8/19 >> negative  CT ABD/PELVIS 04/22/20 1.Moderate  ascites and anasarca, new since the prior CT. 2. Interval increase in the size of bilateral pleural effusion and associated atelectasis or infiltrate. 3. Thickened appearance of the wall of the stomach and loops of small bowel, likely related to ascites. Clinical correlation is recommended to evaluate for possibility of gastroenteritis. No bowel obstruction. 4. Uterine fibroid. 5. A 2 cm right ovarian dominant follicle or cyst  Microbiology:see note Blood Culture    Component Value Date/Time   SDES  01/14/2018 0005    BLOOD RIGHT FOREARM Performed at [facility], [address] [place], [place] [address]    [facility]  01/14/2018 0005    BOTTLES DRAWN AEROBIC AND ANAEROBIC Blood Culture adequate volume Performed at [facility], [address] [place], [place] [address]    CULT (A) 01/14/2018 0005    STAPHYLOCOCCUS SPECIES (COAGULASE NEGATIVE) THE SIGNIFICANCE OF ISOLATING THIS ORGANISM FROM A SINGLE SET OF BLOOD CULTURES WHEN MULTIPLE SETS ARE DRAWN IS UNCERTAIN. PLEASE NOTIFY THE MICROBIOLOGY DEPARTMENT WITHIN ONE WEEK IF SPECIATION AND SENSITIVITIES ARE REQUIRED. Performed at [facility], [place] [address] [place], [place] [address]    REPTSTATUS 01/17/2018 FINAL 01/14/2018 0005    Other culture-see note  Medications: Scheduled Meds: . Chlorhexidine Gluconate Cloth  6 each Topical Daily  . feeding supplement (ENSURE ENLIVE)  237 mL Oral TID BM  . folic acid  1 mg Intravenous Daily  . furosemide  40 mg Intravenous BID  . insulin aspart  0-6 Units Subcutaneous Q4H  . lactulose  30 g Per Tube Daily  . mouth rinse  15 mL Mouth Rinse BID  . multivitamin  15 mL Per Tube q1800  . pantoprazole (PROTONIX) IV  40 mg Intravenous Q24H  . rifaximin  550 mg Oral BID  . sodium chloride flush  10-40 mL Intracatheter Q12H  . sodium chloride flush  10-40 mL Intracatheter Q12H  . tamsulosin  0.4 mg Oral QPC supper  . thiamine  100 mg Per Tube Daily   Or  . thiamine  100 mg Intravenous Daily   Continuous Infusions: . albumin human 25 g (04/23/20 0833)  . dextrose 5% lactated ringers 30 mL/hr at 04/22/20 2324    Antimicrobials: Anti-infectives (From admission, onward)   Start     Dose/Rate Route Frequency Ordered Stop   04/18/20 2215  rifaximin (XIFAXAN) tablet 550 mg        550 mg Oral 2 times daily 04/18/20 2208     04/15/20 1000  rifaximin (XIFAXAN) tablet 550 mg  Status:  Discontinued        550 mg Per Tube 2 times daily 04/15/20 0856 04/18/20 2209   04/12/20 1100  rifaximin (XIFAXAN) tablet 550 mg  Status:  Discontinued        550 mg Per Tube 2 times daily 04/12/20 1024 04/15/20 0856   04/12/20 1100  cefTRIAXone (ROCEPHIN) 2 g in sodium chloride 0.9 % 100 mL IVPB  Status:  Discontinued        2 g 200 mL/hr over 30  Minutes Intravenous Every 24 hours 04/12/20 1044 04/22/20 1211       Objective: Vitals: Today's Vitals   04/22/20 2231 04/23/20 0401 04/23/20 0405 04/23/20 0800  BP: 118/79 117/83    Pulse: 95 98    Resp: 20 20    Temp: 98.7 F (37.1 C) 99.4 F (37.4 C)    TempSrc: Oral Oral    SpO2: 95% 97%    Weight:  75.1 kg   Height:      PainSc:    Asleep    Intake/Output Summary (Last 24 hours) at 04/23/2020 1029 Last data filed at 04/23/2020 0600 Gross per 24 hour  Intake 1021.95 ml  Output 10550 ml  Net -9528.05 ml   Filed Weights   04/21/20 0500 04/22/20 0437 04/23/20 0405  Weight: 81.8 kg 81.9 kg 75.1 kg   Weight change: -6.82 kg   Intake/Output from previous day: 08/29 0701 - 08/30 0700 In: 1022 [I.V.:630.6; IV Piggyback:391.4] Out: 12400 [Urine:12400] Intake/Output this shift: No intake/output data recorded.  Examination:  General exam: AAOx3, and, old for age,NAD, weak appearing. Icteric. HEENT:Oral mucosa moist, Ear/Nose WNL grossly, dentition normal. Respiratory system: bilaterally clear,no wheezing or crackles,no use of accessory muscle Cardiovascular system: S1 & S2 +, No JVD,. Gastrointestinal system: Abdomen soft, mildly distended,NT,ND, BS+ Nervous System:Alert, awake, moving extremities and grossly nonfocal Extremities: b/l ankle edema, distal peripheral pulses palpable.  Skin: No rashes,no icterus. MSK: Normal muscle bulk,tone, power  Data Reviewed: I have personally reviewed following labs and imaging studies CBC: Recent Labs  Lab 04/19/20 0219 04/20/20 0304 04/21/20 0357 04/22/20 0400 04/23/20 0417  WBC 14.9* 14.3* 13.3* 12.2* 12.7*  NEUTROABS 11.8* 11.6* 11.2* 10.0* 10.3*  HGB 8.4* 8.6* 8.3* 7.4* 7.0*  HCT 24.6* 26.1* 25.3* 23.2* 21.0*  MCV 84.0 87.3 88.2 89.9 89.4  PLT 73* 74* 97* 96* 89*   Basic Metabolic Panel: Recent Labs  Lab 04/17/20 0515 04/17/20 0515 04/18/20 0301 04/18/20 0301 04/19/20 0219 04/20/20 0304 04/21/20 0357  04/21/20 1715 04/22/20 0400  NA 150*   < > 142  --  137 135 135  --  136  K 3.5   < > 3.1*   < > 3.6 3.2* 2.9* 3.4* 3.8  CL 118*   < > 111  --  110 109 107  --  107  CO2 25   < > 21*  --  19* 19* 21*  --  20*  GLUCOSE 115*   < > 109*  --  97 98 121*  --  99  BUN 21*   < > 18  --  _0 --  11  CREATININE 0.72   < > 0.72  --  <0.30* 0.41* 0.68  --  0.78  CALCIUM 8.1*   < > 8.0*  --  7.8* 7.9* 8.0*  --  8.2*  MG 2.3  --   --   --   --   --   --   --   --   PHOS 3.4  --  3.4  --  3.4 3.3  --   --   --    < > = values in this interval not displayed.   GFR: Estimated Creatinine Clearance: 107.5 mL/min (by C-G formula based on SCr of 0.78 mg/dL). Liver Function Tests: Recent Labs  Lab 04/18/20 0301 04/19/20 0219 04/20/20 0304 04/21/20 0357 04/22/20 0400  AST 286* 257* 196* 172* 134*  ALT 314* 268* 244* 209* 166*  ALKPHOS 149* 158* 190* 187* 164*  BILITOT 26.6* 25.8* 28.3* 26.8* 24.0*  PROT 5.3* 5.1* 5.5* 5.3* 5.1*  ALBUMIN 2.1* 1.8* 1.8* 1.8* 2.0*   No results for input(s): LIPASE, AMYLASE in the last 168 hours. Recent Labs  Lab 04/16/20 1554  AMMONIA 24   Coagulation Profile: Recent Labs  Lab 04/17/20 0515 04/20/20 0304  INR 1.5* 1.4*   Cardiac Enzymes: No results for input(s): CKTOTAL, CKMB, CKMBINDEX, TROPONINI in the  last 168 hours. BNP (last 3 results) No results for input(s): PROBNP in the last 8760 hours. HbA1C: No results for input(s): HGBA1C in the last 72 hours. CBG: Recent Labs  Lab 04/22/20 1617 04/22/20 1945 04/22/20 2340 04/23/20 0403 04/23/20 0744  GLUCAP 128* 100* 94 107* 89   Lipid Profile: No results for input(s): CHOL, HDL, LDLCALC, TRIG, CHOLHDL, LDLDIRECT in the last 72 hours. Thyroid Function Tests: No results for input(s): TSH, T4TOTAL, FREET4, T3FREE, THYROIDAB in the last 72 hours. Anemia Panel: No results for input(s): VITAMINB12, FOLATE, FERRITIN, TIBC, IRON, RETICCTPCT in the last 72 hours. Sepsis Labs: Recent Labs  Lab  04/17/20 0515  LATICACIDVEN 1.2    No results found for this or any previous visit (from the past 240 hour(s)).    Radiology Studies: CT ABDOMEN PELVIS W CONTRAST  Result Date: 04/22/2020 CLINICAL DATA:  29 year old female with abdominal distension. No urine output. EXAM: CT ABDOMEN AND PELVIS WITH CONTRAST TECHNIQUE: Multidetector CT imaging of the abdomen and pelvis was performed using the standard protocol following bolus administration of intravenous contrast. CONTRAST:  116m OMNIPAQUE IOHEXOL 300 MG/ML  SOLN COMPARISON:  CT dated 04/04/2020. FINDINGS: Lower chest: Partially visualized small bilateral pleural effusions with associated bibasilar compressive atelectasis versus infiltrate. Interval increase in the size of the pleural effusion and associated pulmonary densities since the prior CT. No intra-abdominal free air. There is moderate ascites, new since the prior CT. Hepatobiliary: The liver is slightly heterogeneous, otherwise unremarkable. No intrahepatic biliary ductal dilatation. No calcified gallstone. Pancreas: The pancreas is unremarkable. Evaluation for pancreatitis or pancreatic inflammation is limited due to ascites. Spleen: Normal in size without focal abnormality. Adrenals/Urinary Tract: The adrenal glands unremarkable. There is no hydronephrosis on either side. There is symmetric enhancement and excretion of contrast by both kidneys. The visualized ureters appear unremarkable. The urinary bladder is decompressed around a Foley catheter. Stomach/Bowel: There is a small hiatal hernia. Diffuse thickened appearance of the wall of the stomach and loops of small bowel, likely related to ascites. Clinical correlation is recommended to evaluate for possibility of gastroenteritis. There is no bowel obstruction. No CT findings of acute appendicitis. Vascular/Lymphatic: The abdominal aorta and IVC unremarkable. No portal venous gas. There is no adenopathy. Reproductive: There is a 4.5 cm  posterior uterine fibroid. There is a 2 cm right ovarian dominant follicle or cyst. Other: Diffuse subcutaneous edema and anasarca. Musculoskeletal: No acute or significant osseous findings. IMPRESSION: 1. Moderate ascites and anasarca, new since the prior CT. 2. Interval increase in the size of bilateral pleural effusion and associated atelectasis or infiltrate. 3. Thickened appearance of the wall of the stomach and loops of small bowel, likely related to ascites. Clinical correlation is recommended to evaluate for possibility of gastroenteritis. No bowel obstruction. 4. Uterine fibroid. 5. A 2 cm right ovarian dominant follicle or cyst. Electronically Signed   By: AAnner CreteM.D.   On: 04/22/2020 00:44     LOS: 19 days   RAntonieta Pert MD [facility]  04/23/2020, 10:29 AM

## 2020-04-23 NOTE — Progress Notes (Signed)
Physical Therapy Treatment Patient Details Name: Gina Hooper MRN: [id number] DOB: October 31, 1990 Today's Date: 04/23/2020    History of Present Illness Pt admitted with acute encephalopathy and noted to be in acute liver failure in the setting of Alchoholic Hepatitis with profound coagulopathy and hematemesis. ETT x9 days. Pt with hx of ETOH and polysubstance abuse as well as osteomylitis of L ankle    PT Comments    Pt able to participate in multiple transfers, small bouts of ambulation, and balance activities with ADLs.  She required min cues for safety and min A for transfers.  Pt did fatigue easily requiring frequent rest breaks.  Good progress today.  Cont POC.     Follow Up Recommendations  CIR     Equipment Recommendations  Rolling walker with 5" wheels;3in1 (PT);Wheelchair (measurements PT);Wheelchair cushion (measurements PT) (will neeed further assessment next venue)    Recommendations for Other Services Rehab consult     Precautions / Restrictions Precautions Precautions: Fall    Mobility  Bed Mobility Overal bed mobility: Needs Assistance Bed Mobility: Supine to Sit;Sit to Supine     Supine to sit: Min assist Sit to supine: Min assist   General bed mobility comments: Supine to sit: pt able to get legs off bed, required min A to pull up and scoot forward.  Sit to supine: required assist with legws  Transfers Overall transfer level: Needs assistance Equipment used: Rolling walker (2 wheeled) Transfers: Sit to/from Stand Sit to Stand: Min assist;Min guard         General transfer comment: sit to stand x 5 throughout session with min guard to min A to stand; cued for safe hand placement  Ambulation/Gait Ambulation/Gait assistance: Min assist Gait Distance (Feet): 5 Feet (3',5', 3') Assistive device: Rolling walker (2 wheeled) Gait Pattern/deviations: Step-through pattern;Decreased stride length;Shuffle;Narrow base of support Gait velocity: decr    General Gait Details: Ambulated to bsc, to recliner, back to bed; required rest breaks, cues for RW, and min A for steadying   Stairs             Wheelchair Mobility    Modified Rankin (Stroke Patients Only)       Balance Overall balance assessment: Needs assistance Sitting-balance support: No upper extremity supported;Feet supported Sitting balance-Leahy Scale: Good Sitting balance - Comments: Pt able to sit at edge of chair for 5 minutes without support during ADLs.  Balance was steady with weight shifting and pertubations.  Did fatigue easily.   Standing balance support: During functional activity;Bilateral upper extremity supported Standing balance-Leahy Scale: Poor Standing balance comment: Required RW in standing but with RW able to stand with min pertubations during ADLs .  Stood for 2 reps 1 min each.                            Cognition Arousal/Alertness: Awake/alert Behavior During Therapy: WFL for tasks assessed/performed Overall Cognitive Status: Within Functional Limits for tasks assessed                                 General Comments: min cues for transfer techniques and safety      Exercises      General Comments General comments (skin integrity, edema, etc.): HR up to 121 with walking .  O2 sats 94% rest, 88% walking with recovery in <1 minute, on RA.      Pertinent  Vitals/Pain Pain Assessment: Faces Faces Pain Scale: Hurts a little bit Pain Location: low back Pain Descriptors / Indicators: Discomfort;Restless Pain Intervention(s): Monitored during session;Repositioned;Relaxation    Home Living                      Prior Function            PT Goals (current goals can now be found in the care plan section) Acute Rehab PT Goals Patient Stated Goal: walk more PT Goal Formulation: With patient Time For Goal Achievement: 04/29/20 Potential to Achieve Goals: Good Progress towards PT goals: Progressing  toward goals    Frequency    Min 3X/week      PT Plan Current plan remains appropriate    Co-evaluation              AM-PAC PT "6 Clicks" Mobility   Outcome Measure  Help needed turning from your back to your side while in a flat bed without using bedrails?: A Little Help needed moving from lying on your back to sitting on the side of a flat bed without using bedrails?: A Little Help needed moving to and from a bed to a chair (including a wheelchair)?: A Little Help needed standing up from a chair using your arms (e.g., wheelchair or bedside chair)?: A Little Help needed to walk in hospital room?: A Little Help needed climbing 3-5 steps with a railing? : A Lot 6 Click Score: 17    End of Session Equipment Utilized During Treatment: Gait belt Activity Tolerance: Patient tolerated treatment well Patient left: with call bell/phone within reach;with nursing/sitter in room;in bed;with bed alarm set Nurse Communication: Mobility status (assisted pt with bath and toielting - nurse tech aware) PT Visit Diagnosis: Unsteadiness on feet (R26.81);Muscle weakness (generalized) (M62.81);Difficulty in walking, not elsewhere classified (R26.2)     Time: 1610-9604 PT Time Calculation (min) (ACUTE ONLY): 35 min  Charges:  $Gait Training: 8-22 mins $Therapeutic Activity: 8-22 mins                     Anise Salvo, PT Acute Rehab Services Pager [phone] [facility] [phone]     Rayetta Humphrey 04/23/2020, 4:06 PM

## 2020-04-23 NOTE — Progress Notes (Signed)
Right upper extremity venous duplex has been completed. Preliminary results can be found in CV Proc through chart review.   04/23/20 9:11 AM Olen Cordial RVT

## 2020-04-24 DIAGNOSIS — K7201 Acute and subacute hepatic failure with coma: Secondary | ICD-10-CM | POA: Diagnosis not present

## 2020-04-24 LAB — CBC WITH DIFFERENTIAL/PLATELET
Abs Immature Granulocytes: 0.12 10*3/uL — ABNORMAL HIGH (ref 0.00–0.07)
Basophils Absolute: 0 10*3/uL (ref 0.0–0.1)
Basophils Relative: 0 %
Eosinophils Absolute: 0.1 10*3/uL (ref 0.0–0.5)
Eosinophils Relative: 1 %
HCT: 19.4 % — ABNORMAL LOW (ref 36.0–46.0)
Hemoglobin: 6.2 g/dL — CL (ref 12.0–15.0)
Immature Granulocytes: 1 %
Lymphocytes Relative: 17 %
Lymphs Abs: 1.9 10*3/uL (ref 0.7–4.0)
MCH: 29.1 pg (ref 26.0–34.0)
MCHC: 32 g/dL (ref 30.0–36.0)
MCV: 91.1 fL (ref 80.0–100.0)
Monocytes Absolute: 0.6 10*3/uL (ref 0.1–1.0)
Monocytes Relative: 5 %
Neutro Abs: 8.2 10*3/uL — ABNORMAL HIGH (ref 1.7–7.7)
Neutrophils Relative %: 76 %
Platelets: 78 10*3/uL — ABNORMAL LOW (ref 150–400)
RBC: 2.13 MIL/uL — ABNORMAL LOW (ref 3.87–5.11)
WBC: 10.9 10*3/uL — ABNORMAL HIGH (ref 4.0–10.5)
nRBC: 0 % (ref 0.0–0.2)

## 2020-04-24 LAB — GLUCOSE, CAPILLARY
Glucose-Capillary: 104 mg/dL — ABNORMAL HIGH (ref 70–99)
Glucose-Capillary: 104 mg/dL — ABNORMAL HIGH (ref 70–99)
Glucose-Capillary: 112 mg/dL — ABNORMAL HIGH (ref 70–99)
Glucose-Capillary: 114 mg/dL — ABNORMAL HIGH (ref 70–99)
Glucose-Capillary: 75 mg/dL (ref 70–99)
Glucose-Capillary: 94 mg/dL (ref 70–99)

## 2020-04-24 LAB — COMPREHENSIVE METABOLIC PANEL
ALT: 82 U/L — ABNORMAL HIGH (ref 0–44)
AST: 83 U/L — ABNORMAL HIGH (ref 15–41)
Albumin: 2.7 g/dL — ABNORMAL LOW (ref 3.5–5.0)
Alkaline Phosphatase: 100 U/L (ref 38–126)
Anion gap: 10 (ref 5–15)
BUN: 9 mg/dL (ref 6–20)
CO2: 27 mmol/L (ref 22–32)
Calcium: 8.1 mg/dL — ABNORMAL LOW (ref 8.9–10.3)
Chloride: 95 mmol/L — ABNORMAL LOW (ref 98–111)
Creatinine, Ser: 0.63 mg/dL (ref 0.44–1.00)
GFR calc Af Amer: >60 mL/min (ref >60)
GFR calc non Af Amer: >60 mL/min (ref >60)
Glucose, Bld: 80 mg/dL (ref 70–99)
Potassium: 3.2 mmol/L — ABNORMAL LOW (ref 3.5–5.1)
Sodium: 132 mmol/L — ABNORMAL LOW (ref 135–145)
Total Bilirubin: 23.6 mg/dL (ref 0.3–1.2)
Total Protein: 5.1 g/dL — ABNORMAL LOW (ref 6.5–8.1)

## 2020-04-24 LAB — HEMOGLOBIN AND HEMATOCRIT, BLOOD
HCT: 26.9 % — ABNORMAL LOW (ref 36.0–46.0)
Hemoglobin: 8.9 g/dL — ABNORMAL LOW (ref 12.0–15.0)

## 2020-04-24 LAB — POTASSIUM: Potassium: 4.1 mmol/L (ref 3.5–5.1)

## 2020-04-24 LAB — PREPARE RBC (CROSSMATCH)

## 2020-04-24 MED ORDER — POTASSIUM CHLORIDE CRYS ER 20 MEQ PO TBCR
40.0000 meq | EXTENDED_RELEASE_TABLET | ORAL | Status: AC
  Administered 2020-04-24 (×2): 40 meq via ORAL
  Filled 2020-04-24 (×2): qty 2

## 2020-04-24 MED ORDER — SPIRONOLACTONE 25 MG PO TABS
25.0000 mg | ORAL_TABLET | Freq: Every day | ORAL | Status: DC
  Administered 2020-04-24 – 2020-05-01 (×8): 25 mg via ORAL
  Filled 2020-04-24 (×8): qty 1

## 2020-04-24 MED ORDER — SODIUM CHLORIDE 0.9% IV SOLUTION: Freq: Once | INTRAVENOUS | Status: DC

## 2020-04-24 NOTE — Progress Notes (Signed)
PT Cancellation Note  Patient Details Name: Gina Hooper MRN: [id number] DOB: 11/13/1990   Cancelled Treatment:    Reason Eval/Treat Not Completed: Medical issues which prohibited therapy. Pt Hgb 6.2, just began transfusion. Defer at this time    [facility] 04/24/2020, 11:30 AM

## 2020-04-24 NOTE — Progress Notes (Signed)
CRITICAL VALUE ALERT  Critical Value:  hgb 6.2  Date & Time Notied:  04/24/20 0505  Provider Notified: Katherina Right, NP  Orders Received/Actions taken: Awaiting new orders.

## 2020-04-24 NOTE — Progress Notes (Signed)
OT Cancellation Note  Patient Details Name: Gina Hooper MRN: [id number] DOB: Jul 11, 1991   Cancelled Treatment:    Reason Eval/Treat Not Completed: Other (comment) First attempt patient receiving blood, second attempt patient reports just back to bed from recliner and getting washed up, feeling very tired. Will re-attempt 9/1 as schedule allows.  Marlyce Huge OT OT pager: [phone]   Carmelia Roller 04/24/2020, 2:08 PM

## 2020-04-24 NOTE — Progress Notes (Signed)
PROGRESS NOTE    Gina Hooper  ZOX:[id number] DOB: 1990/10/12 DOA: 04/04/2020 PCP: Patient, No Pcp Per   Chief Complaint  Patient presents with  . Emesis  . Hypoglycemia   Brief Narrative: 29 year old female with sudden severe alcohol abuse depression anemia history presented initially 04/04/2020 regarding pain, nausea vomiting, hypoglycemia at home.  In the ED found to have acute liver failure AKI creatinine 1.2 ALP 147 AST more than 10,000 ALT 4566 total bili 4.8 INR 9.9.  Patient was admitted underwent extensive hospitalization further evaluation with GI, treated with MAC, completed 04/07/2020, continued on IV Solu-Medrol, evaluated from a transplant standpoint and deemed not a candidate due to her underlying substance abuse and lack of insurance.  Hospital course complicated by hematemesis on 8/12 due to coagulopathy, felt to be high risk and poor endoscopy candidate and treated with PPI.  Patient developed progressive worsening mentation and became minimally responsive encephalopathic needing intubation 04/06/2020, MRI 8/14 no acute finding, seen by neurology, EEG 8/16 severe diffuse encephalopathy, no seizure, extubated 8/22, transferred to [facility] 8/24.  Pt was placed on Precedex overnight 8/23 due to significant agitation and off  8/24 night. She is waking up more. SLP has placed on diet. PTOT is working with her Patient transfer out of stepdown unit 8/27.  Subjective:  Patient reports he feels "all right".  No new complaints.  Potassium improving.  Urine output has slowed down after stopping Lasix.   hb low overnight and 1 unit PRBCs ordered.  Assessment & Plan:  Acute liver failure/Jaundice in the setting of acute alcohol hepatitis +/- possible Tylenol toxicity/possible WJX:BJYN by GI, was discussed with the transplant team while in ICU and not felt to be a candidate for liver transplant.MELD-na 40,discriminant function 304 on admission.  LFTs continue to improve very slowly and much  better  Although TB is high,last INR at 1.4.continue current supportive measures.  Patient completed ceftriaxone for possible SBP.   Will initiate Aldactone/diuretics Recent Labs  Lab 04/20/20 0304 04/21/20 0357 04/22/20 0400 04/23/20 1002 04/24/20 0355  AST 196* 172* 134* 97* 83*  ALT 244* 209* 166* 104* 82*  ALKPHOS 190* 187* 164* 119 100  BILITOT 28.3* 26.8* 24.0* 27.7* 23.6*  PROT 5.5* 5.3* 5.1* 6.2* 5.1*  ALBUMIN 1.8* 1.8* 2.0* 3.8 2.7*  INR 1.4*  --   --   --   --    Acute hepatic/metabolic encephalopathy/acute alcohol withdrawal: Seen by neurology, MRI brain no acute finding EEG with diffuse encephalopathy.  She is alert awake oriented. Continue supportive measures, off restraint and off Precedex 8/24 . Cont Lactulose, rifaximin folic acid multivitamin thiamine.  Moderate ascites/anasarca/bilateral pleural effusion:Patient has gained at least 33 pounds since admission. 147>> 180 with lasix, significant uop and wt 165> 162.  Lasix diabetes significant diuresis 12 L in a day.  Repeating potassium, will start Aldactone monitor intake output Daily weight  Hypokalemia: Due to diuresis.  Being repleted and repeat lab today.  Hypomagnesemia was repleted.    FEN: keep at low ivf at 30 ml/hr .  Encourage oral hydration. D.c soon  Hypernatremia: resovlved  Acute respiratory failure due to liver failure hepatoencephalopathy needing intubation-on vent for 9 days.  On room air currently.  Coagulopathy secondary to liver failure. monitor INR.  Appears overall stable  Upper GI bleed/hematemesis in the setting of gastritis/coagulopathy seen by GI poor endoscopy candidate monitor hb.continue PPI. Anemia acute blood loss less/anemia of critical illness: Hemoglobin down to 6.2 g, as well as repeat will be ordered  after 1 unit prbc transfusion.  Recent Labs  Lab 04/21/20 0357 04/22/20 0400 04/23/20 0417 04/23/20 1842 04/24/20 0355  HGB 8.3* 7.4* 7.0* 7.1* 6.2*  HCT 25.3* 23.2* 21.0*  22.0* 19.4*   Thrombocytopenia:in the setting of liver disease and alcohol abuse: Platelet  7k-90k monitor  Recent Labs  Lab 04/20/20 0304 04/21/20 0357 04/22/20 0400 04/23/20 0417 04/24/20 0355  PLT 74* 97* 96* 89* 78*   Moderate protein calorie malnutrition: Continue to increase diet to augment nutrition.  CoNS in blood cx 1 bottle-likely contamination.  Leukocytosis WBC downtrending.ccompleted antibiotics.  Recent Labs  Lab 04/20/20 0304 04/21/20 0357 04/22/20 0400 04/23/20 0417 04/24/20 0355  WBC 14.3* 13.3* 12.2* 12.7* 10.9*   Deconditioning:PT/OT consulted , continue to mobilize her.    Foley + patient has Foley back in place for diuresis.  Was discontinued 8/27 and placed back in 8/28.  Her list will remove Foley catheter tomorrow  DVT prophylaxis: SCDs Start: 04/04/20 1659 Code Status:   Code Status: Full Code  Family Communication: Plan of care discussed with patient at bedside. Updated her father 8/29-he reports he is involved in her care and will take her to his home in [place] after discharge and not planning her to return to her grandmother and prefers CIR. sister at the bedside updated 8/30.  Status YI:FOYDXAJOI. Remains inpatient appropriate because:IV treatments appropriate due to intensity of illness or inability to take PO and Inpatient level of care appropriate due to severity of illness Dispo: The patient is from: Home              Anticipated d/c is to: CIR,consulted.              Anticipated d/c date is: > 3 days              Patient currently is not medically stable to d/c. Nutrition: Diet Order            DIET DYS 3 Room service appropriate? Yes with Assist; Fluid consistency: Thin  Diet effective now                 Nutrition Problem: Inadequate oral intake Etiology: inability to eat Signs/Symptoms: NPO status Interventions: Refer to RD note for recommendations Body mass index is 26.26 kg/m.  Consultants:see note  Procedures: UDS  8/11 >> opiate / THC / cocaine positive  CT ABD 8/11 >> borderline hepatomegaly with diffuse patchy low-attenuation with concern for acute inflammation/hepatitis.  Gallbladder wall thickening with pericholecystic fluid felt reactive there were no gallstones.  There is a small right effusion.  Mild ascites in the pelvis. MRI Brain 8/14 >> no acute intracranial process. Mild pansinus disease  EEG 8/16 >> suggestive of severe diffuse encephalopathy, non-specific etiology ECHO 8/19 >> LVEF 65-70%, no RWMA, RV systolic function normal LE Venous Duplex 8/19 >> negative  CT ABD/PELVIS 04/22/20 1.Moderate ascites and anasarca, new since the prior CT. 2. Interval increase in the size of bilateral pleural effusion and associated atelectasis or infiltrate. 3. Thickened appearance of the wall of the stomach and loops of small bowel, likely related to ascites. Clinical correlation is recommended to evaluate for possibility of gastroenteritis. No bowel obstruction. 4. Uterine fibroid. 5. A 2 cm right ovarian dominant follicle or cyst  Microbiology:see note Blood Culture    Component Value Date/Time   SDES  01/14/2018 0005    BLOOD RIGHT FOREARM Performed at [facility], [address] [place], [place] [address]    [employer]  01/14/2018 0005    BOTTLES DRAWN AEROBIC AND ANAEROBIC Blood Culture adequate volume Performed at [facility], [address] [place], [place] [address]    CULT (A) 01/14/2018 0005    STAPHYLOCOCCUS SPECIES (COAGULASE NEGATIVE) THE SIGNIFICANCE OF ISOLATING THIS ORGANISM FROM A SINGLE SET OF BLOOD CULTURES WHEN MULTIPLE SETS ARE DRAWN IS UNCERTAIN. PLEASE NOTIFY THE MICROBIOLOGY DEPARTMENT WITHIN ONE WEEK IF SPECIATION AND SENSITIVITIES ARE REQUIRED. Performed at [facility], [place] [address] [place], [place] [address]    REPTSTATUS 01/17/2018 FINAL 01/14/2018 0005    Other culture-see note  Medications: Scheduled Meds: . sodium  chloride   Intravenous Once  . Chlorhexidine Gluconate Cloth  6 each Topical Daily  . feeding supplement (ENSURE ENLIVE)  237 mL Oral TID BM  . folic acid  1 mg Oral Daily  . insulin aspart  0-6 Units Subcutaneous Q4H  . lactulose  30 g Oral Daily  . magnesium oxide  400 mg Oral BID  . mouth rinse  15 mL Mouth Rinse BID  . multivitamin with minerals  1 tablet Oral Daily  . pantoprazole  40 mg Oral Daily  . rifaximin  550 mg Oral BID  . sodium chloride flush  10-40 mL Intracatheter Q12H  . sodium chloride flush  10-40 mL Intracatheter Q12H  . tamsulosin  0.4 mg Oral QPC supper  . thiamine  100 mg Oral Daily   Or  . thiamine  100 mg Intravenous Daily   Continuous Infusions: . dextrose 5% lactated ringers 30 mL/hr at 04/23/20 2212    Antimicrobials: Anti-infectives (From admission, onward)   Start     Dose/Rate Route Frequency Ordered Stop   04/18/20 2215  rifaximin (XIFAXAN) tablet 550 mg        550 mg Oral 2 times daily 04/18/20 2208     04/15/20 1000  rifaximin (XIFAXAN) tablet 550 mg  Status:  Discontinued        550 mg Per Tube 2 times daily 04/15/20 0856 04/18/20 2209   04/12/20 1100  rifaximin (XIFAXAN) tablet 550 mg  Status:  Discontinued        550 mg Per Tube 2 times daily 04/12/20 1024 04/15/20 0856   04/12/20 1100  cefTRIAXone (ROCEPHIN) 2 g in sodium chloride 0.9 % 100 mL IVPB  Status:  Discontinued        2 g 200 mL/hr over 30 Minutes Intravenous Every 24 hours 04/12/20 1044 04/22/20 1211       Objective: Vitals: Today's Vitals   04/24/20 1015 04/24/20 1045 04/24/20 1246 04/24/20 1345  BP: 109/75 114/82 112/69 115/71  Pulse: 98 (!) 102 (!) 104   Resp: (!) 22  18 (!) 24  Temp: 99.8 F (37.7 C) 99.3 F (37.4 C) 99.6 F (37.6 C) 98.9 F (37.2 C)  TempSrc: Oral Oral Oral Oral  SpO2: 100% 100% 96% 100%  Weight:      Height:      PainSc:  0-No pain      Intake/Output Summary (Last 24 hours) at 04/24/2020 1502 Last data filed at 04/24/2020 1100 Gross per  24 hour  Intake 1588.79 ml  Output 2075 ml  Net -486.21 ml   Filed Weights   04/22/20 0437 04/23/20 0405 04/24/20 0434  Weight: 81.9 kg 75.1 kg 73.8 kg   Weight change: -1.3 kg   Intake/Output from previous day: 08/30 0701 - 08/31 0700 In: 1023.8 [I.V.:720.1; IV Piggyback:303.7] Out: 1750 [Urine:1750] Intake/Output this shift: Total I/O In:  565 [I.V.:250; Blood:315] Out: 80 [Urine:325]  Examination:  General exam: AAx3, old for age, not in distress HEENT:Oral mucosa moist, Ear/Nose WNL grossly, dentition normal. Respiratory system: bilaterally  clear,no wheezing or crackles,no use of accessory muscle. Icteric. Cardiovascular system: S1 & S2 +, No JVD. Gastrointestinal system: Abdomen soft, NT,ND, BS+ Nervous System:Alert, awake, moving extremities and grossly nonfocal Extremities: b/l leg edema, distal peripheral pulses palpable.  Skin: No rashes,no icterus. MSK: Normal muscle bulk,tone, power  Data Reviewed: I have personally reviewed following labs and imaging studies CBC: Recent Labs  Lab 04/20/20 0304 04/20/20 0304 04/21/20 0357 04/22/20 0400 04/23/20 0417 04/23/20 1842 04/24/20 0355  WBC 14.3*  --  13.3* 12.2* 12.7*  --  10.9*  NEUTROABS 11.6*  --  11.2* 10.0* 10.3*  --  8.2*  HGB 8.6*   < > 8.3* 7.4* 7.0* 7.1* 6.2*  HCT 26.1*   < > 25.3* 23.2* 21.0* 22.0* 19.4*  MCV 87.3  --  88.2 89.9 89.4  --  91.1  PLT 74*  --  97* 96* 89*  --  78*   < > = values in this interval not displayed.   Basic Metabolic Panel: Recent Labs  Lab 04/18/20 0301 04/18/20 0301 04/19/20 0219 04/19/20 0219 04/20/20 0304 04/20/20 0304 04/21/20 0357 04/21/20 0357 04/21/20 1715 04/22/20 0400 04/23/20 1002 04/23/20 1842 04/24/20 0355  NA 142   < > 137   < > 135  --  135  --   --  136 134*  --  132*  K 3.1*   < > 3.6   < > 3.2*   < > 2.9*   < > 3.4* 3.8 2.1* 2.5* 3.2*  CL 111   < > 110   < > 109  --  107  --   --  107 92*  --  95*  CO2 21*   < > 19*   < > 19*  --  21*  --    --  20* 29  --  27  GLUCOSE 109*   < > 97   < > 98  --  121*  --   --  99 90  --  80  BUN 18   < > 14   < > 12  --  12  --   --  11 9  --  9  CREATININE 0.72   < > <0.30*   < > 0.41*  --  0.68  --   --  0.78 0.64  --  0.63  CALCIUM 8.0*   < > 7.8*   < > 7.9*  --  8.0*  --   --  8.2* 8.4*  --  8.1*  MG  --   --   --   --   --   --   --   --   --   --  1.1*  --   --   PHOS 3.4  --  3.4  --  3.3  --   --   --   --   --  3.9  --   --    < > = values in this interval not displayed.   GFR: Estimated Creatinine Clearance: 106.6 mL/min (by C-G formula based on SCr of 0.63 mg/dL). Liver Function Tests: Recent Labs  Lab 04/20/20 0304 04/21/20 0357 04/22/20 0400 04/23/20 1002 04/24/20 0355  AST 196* 172* 134* 97* 83*  ALT 244* 209* 166* 104* 82*  ALKPHOS 190* 187* 164* 119  100  BILITOT 28.3* 26.8* 24.0* 27.7* 23.6*  PROT 5.5* 5.3* 5.1* 6.2* 5.1*  ALBUMIN 1.8* 1.8* 2.0* 3.8 2.7*   No results for input(s): LIPASE, AMYLASE in the last 168 hours. No results for input(s): AMMONIA in the last 168 hours. Coagulation Profile: Recent Labs  Lab 04/20/20 0304  INR 1.4*   Cardiac Enzymes: No results for input(s): CKTOTAL, CKMB, CKMBINDEX, TROPONINI in the last 168 hours. BNP (last 3 results) No results for input(s): PROBNP in the last 8760 hours. HbA1C: No results for input(s): HGBA1C in the last 72 hours. CBG: Recent Labs  Lab 04/23/20 2027 04/24/20 0010 04/24/20 0426 04/24/20 0745 04/24/20 1152  GLUCAP 115* 104* 75 112* 104*   Lipid Profile: No results for input(s): CHOL, HDL, LDLCALC, TRIG, CHOLHDL, LDLDIRECT in the last 72 hours. Thyroid Function Tests: No results for input(s): TSH, T4TOTAL, FREET4, T3FREE, THYROIDAB in the last 72 hours. Anemia Panel: No results for input(s): VITAMINB12, FOLATE, FERRITIN, TIBC, IRON, RETICCTPCT in the last 72 hours. Sepsis Labs: No results for input(s): PROCALCITON, LATICACIDVEN in the last 168 hours.  No results found for this or any  previous visit (from the past 240 hour(s)).    Radiology Studies: VAS [place] UPPER EXTREMITY VENOUS DUPLEX  Result Date: 04/23/2020 UPPER VENOUS STUDY  Indications: Swelling Risk Factors: None identified. Limitations: Bandages and line. Comparison Study: No prior studies. Performing Technologist: Oliver Hum RVT  Examination Guidelines: A complete evaluation includes B-mode imaging, spectral Doppler, color Doppler, and power Doppler as needed of all accessible portions of each vessel. Bilateral testing is considered an integral part of a complete examination. Limited examinations for reoccurring indications may be performed as noted.  Right Findings: +----------+------------+---------+-----------+----------+-------+ RIGHT     CompressiblePhasicitySpontaneousPropertiesSummary +----------+------------+---------+-----------+----------+-------+ IJV           Full       Yes       Yes                      +----------+------------+---------+-----------+----------+-------+ Subclavian    Full       Yes       Yes                      +----------+------------+---------+-----------+----------+-------+ Axillary      Full       Yes       Yes                      +----------+------------+---------+-----------+----------+-------+ Brachial      Full       Yes       Yes                      +----------+------------+---------+-----------+----------+-------+ Radial        Full                                          +----------+------------+---------+-----------+----------+-------+ Ulnar         Full                                          +----------+------------+---------+-----------+----------+-------+ Cephalic      Full                                          +----------+------------+---------+-----------+----------+-------+  Basilic       Full                                          +----------+------------+---------+-----------+----------+-------+  Left  Findings: +----------+------------+---------+-----------+----------+-------+ LEFT      CompressiblePhasicitySpontaneousPropertiesSummary +----------+------------+---------+-----------+----------+-------+ Subclavian    Full       Yes       Yes                      +----------+------------+---------+-----------+----------+-------+  Summary:  Right: No evidence of deep vein thrombosis in the upper extremity. No evidence of superficial vein thrombosis in the upper extremity.  Left: No evidence of thrombosis in the subclavian.  *See table(s) above for measurements and observations.  Diagnosing physician: Ruta Hinds MD Electronically signed by Ruta Hinds MD on 04/23/2020 at 4:30:03 PM.    Final      LOS: 61 days   Antonieta Pert, MD [facility]  04/24/2020, 3:02 PM

## 2020-04-24 NOTE — Progress Notes (Signed)
CRITICAL VALUE ALERT  Critical Value:  Total bili 23.6  Date & Time Notied:  04/24/20 5102  Provider Notified: Katherina Right, NP  Orders Received/Actions taken: Awaiting new orders.

## 2020-04-25 ENCOUNTER — Encounter (HOSPITAL_COMMUNITY): Payer: Self-pay | Admitting: Internal Medicine

## 2020-04-25 DIAGNOSIS — F1910 Other psychoactive substance abuse, uncomplicated: Secondary | ICD-10-CM

## 2020-04-25 LAB — CBC WITH DIFFERENTIAL/PLATELET
Abs Immature Granulocytes: 0.16 10*3/uL — ABNORMAL HIGH (ref 0.00–0.07)
Basophils Absolute: 0 10*3/uL (ref 0.0–0.1)
Basophils Relative: 0 %
Eosinophils Absolute: 0.1 10*3/uL (ref 0.0–0.5)
Eosinophils Relative: 1 %
HCT: 26.5 % — ABNORMAL LOW (ref 36.0–46.0)
Hemoglobin: 8.8 g/dL — ABNORMAL LOW (ref 12.0–15.0)
Immature Granulocytes: 1 %
Lymphocytes Relative: 15 %
Lymphs Abs: 1.7 10*3/uL (ref 0.7–4.0)
MCH: 29.9 pg (ref 26.0–34.0)
MCHC: 33.2 g/dL (ref 30.0–36.0)
MCV: 90.1 fL (ref 80.0–100.0)
Monocytes Absolute: 0.8 10*3/uL (ref 0.1–1.0)
Monocytes Relative: 7 %
Neutro Abs: 8.6 10*3/uL — ABNORMAL HIGH (ref 1.7–7.7)
Neutrophils Relative %: 76 %
Platelets: 90 10*3/uL — ABNORMAL LOW (ref 150–400)
RBC: 2.94 MIL/uL — ABNORMAL LOW (ref 3.87–5.11)
WBC: 11.4 10*3/uL — ABNORMAL HIGH (ref 4.0–10.5)
nRBC: 0 % (ref 0.0–0.2)

## 2020-04-25 LAB — GLUCOSE, CAPILLARY
Glucose-Capillary: 102 mg/dL — ABNORMAL HIGH (ref 70–99)
Glucose-Capillary: 124 mg/dL — ABNORMAL HIGH (ref 70–99)
Glucose-Capillary: 125 mg/dL — ABNORMAL HIGH (ref 70–99)
Glucose-Capillary: 128 mg/dL — ABNORMAL HIGH (ref 70–99)
Glucose-Capillary: 71 mg/dL (ref 70–99)
Glucose-Capillary: 78 mg/dL (ref 70–99)

## 2020-04-25 LAB — TYPE AND SCREEN
ABO/RH(D): B POS
Antibody Screen: NEGATIVE
Unit division: 0

## 2020-04-25 LAB — COMPREHENSIVE METABOLIC PANEL
ALT: 80 U/L — ABNORMAL HIGH (ref 0–44)
AST: 79 U/L — ABNORMAL HIGH (ref 15–41)
Albumin: 2.5 g/dL — ABNORMAL LOW (ref 3.5–5.0)
Alkaline Phosphatase: 99 U/L (ref 38–126)
Anion gap: 6 (ref 5–15)
BUN: 9 mg/dL (ref 6–20)
CO2: 27 mmol/L (ref 22–32)
Calcium: 8.2 mg/dL — ABNORMAL LOW (ref 8.9–10.3)
Chloride: 99 mmol/L (ref 98–111)
Creatinine, Ser: 0.55 mg/dL (ref 0.44–1.00)
GFR calc Af Amer: >60 mL/min (ref >60)
GFR calc non Af Amer: >60 mL/min (ref >60)
Glucose, Bld: 80 mg/dL (ref 70–99)
Potassium: 3.9 mmol/L (ref 3.5–5.1)
Sodium: 132 mmol/L — ABNORMAL LOW (ref 135–145)
Total Bilirubin: 25.9 mg/dL (ref 0.3–1.2)
Total Protein: 5.2 g/dL — ABNORMAL LOW (ref 6.5–8.1)

## 2020-04-25 LAB — BPAM RBC
Blood Product Expiration Date: 202109242359
ISSUE DATE / TIME: 202108311020
Unit Type and Rh: 7300

## 2020-04-25 LAB — MAGNESIUM: Magnesium: 1.4 mg/dL — ABNORMAL LOW (ref 1.7–2.4)

## 2020-04-25 MED ORDER — FUROSEMIDE 20 MG PO TABS
20.0000 mg | ORAL_TABLET | Freq: Every day | ORAL | Status: DC
  Administered 2020-04-25: 20 mg via ORAL
  Filled 2020-04-25: qty 1

## 2020-04-25 MED ORDER — MAGNESIUM SULFATE 4 GM/100ML IV SOLN
4.0000 g | Freq: Once | INTRAVENOUS | Status: AC
  Administered 2020-04-25: 4 g via INTRAVENOUS
  Filled 2020-04-25: qty 100

## 2020-04-25 NOTE — Progress Notes (Signed)
  Speech Language Pathology Treatment: Dysphagia  Patient Details Name: Gina Hooper MRN: [id number] DOB: Apr 25, 1991 Today's Date: 04/25/2020 Time: 8366-2947 SLP Time Calculation (min) (ACUTE ONLY): 19 min  Assessment / Plan / Recommendation Clinical Impression  Pt today seen to assure po tolerance of po diet, determine indication for dietary advancement and for RMST to strengthen cough/submental musculature to improve swallow function.  Pt asleep upon arrival but easily awoken.  She reports she is tired and states she has back pain with HOB elevation.  SLP used reverse trendelenberg position to maximize comfort and airway protection with intake and duirng RMST.  Pt today noted to have frequent coughing at baseline, that is intermittently productive.  Pt encouraged to conduct RMST and willingly participated.  Initially pt was inhaling with the trainer causing SlP to suspect she had not been conducting exercise since provided to her last Thursday.  Max cues to use RMST correctly and moderate verbal/visual cues for appropriate effort.  Pt states she was having back discomfort that may have contributed to her decreased effort.  SLP explained explicity to pt the need to conduct respiratory exercises to help protect her lung, strengthen her cough/swallowing.  She advised she would complete exercises - provided written instructions posted in her roomo and left trainer within her reach on her table.  Observed pt consuming water/pudding and no s/s of aspiration noted.  Pt did have mild silent aspiration on prior mbs, but given her ongoing medical improvement - she appears to be tolerating diet.  Will advance diet to regular/thin and cease chin tuck posture.  Using teach back, pt educated and agreeable to plan.  Marland Kitchen   HPI HPI: 29 yo female adm to [facility] with AMS alcoholic dependence and polysubstance abuse. She presented to the ED on 8/11 with abdominal pain and N/V. She was found to be in acute liver failure in the  setting of alcoholic hepatitis with profound coagulopathy and hematemesis. CCM evaluated 8/12 due to declining mental status and concern about IV access. Pt was intubated x9 days- extubated yesterday am 04/15/2020. Swallow eval ordered.  Pt was started on a modified diet yesterday but per RN, she was coughing excessively with it and the trays were held.  MBS conducted with pt started on dys3/thin diet.  Follow up to assess po tolerance and initiate RMST conducted.      SLP Plan  Continue with current plan of care       Recommendations  Diet recommendations: Regular;Thin liquid Liquids provided via: Straw;Cup Medication Administration: Whole meds with puree (or with liquids) Supervision: Patient able to self feed Compensations: Slow rate;Small sips/bites (tuck chin with liquids) Postural Changes and/or Swallow Maneuvers: Seated upright 90 degrees;Upright 30-60 min after meal                Oral Care Recommendations: Oral care QID Follow up Recommendations: Skilled Nursing facility;Home health SLP SLP Visit Diagnosis: Dysphagia, oropharyngeal phase (R13.12) Plan: Continue with current plan of care       GO              Rolena Infante, MS [facility] SLP Acute Rehab Services Office [phone]   Chales Abrahams 04/25/2020, 8:02 PM

## 2020-04-25 NOTE — Progress Notes (Signed)
Occupational Therapy Treatment Patient Details Name: Gina Hooper MRN: [id number] DOB: 04/21/1991 Today's Date: 04/25/2020    History of present illness Pt admitted with acute encephalopathy and noted to be in acute liver failure in the setting of Alchoholic Hepatitis with profound coagulopathy and hematemesis. ETT x9 days. Pt with hx of ETOH and polysubstance abuse as well as osteomylitis of L ankle   OT comments  Treatment focused on functional mobility and improving independence with ADLs. Patient supervision for bed mobility, ambulated to bathroom with RW and min guard and performed toilet transfer and toileting. Patinet stood at sink to wash face. Min guard provided throughout. Patient reporting fatigue and wanting to sit down. Patient returned to edge of bed and then returned to supine. Patient progressing. Cont POC.   Follow Up Recommendations  CIR (Patient talking about going home at discharge. Discussed patient needing therapy to improve strength and endurance)    Equipment Recommendations  Other (comment)    Recommendations for Other Services      Precautions / Restrictions Precautions Precautions: Fall Precaution Comments: diarrhea Restrictions Weight Bearing Restrictions: No       Mobility Bed Mobility Overal bed mobility: Needs Assistance Bed Mobility: Supine to Sit;Sit to Supine     Supine to sit: Supervision;HOB elevated Sit to supine: Supervision   General bed mobility comments: increased time and use of bed rails needed. supervision for safety.  Transfers Overall transfer level: Needs assistance Equipment used: Rolling walker (2 wheeled) Transfers: Sit to/from Stand Sit to Stand: Min guard Stand pivot transfers: Min guard       General transfer comment: min guard to stand from bed. min guard to ambulate to bathroom.    Balance Overall balance assessment: Needs assistance Sitting-balance support: No upper extremity supported;Feet  supported Sitting balance-Leahy Scale: Good     Standing balance support: During functional activity Standing balance-Leahy Scale: Fair Standing balance comment: Able to lift arms from walker to wash hands and face at sink. Use of arms needed for ambulation.                           ADL either performed or assessed with clinical judgement   ADL Overall ADL's : Needs assistance/impaired     Grooming: Standing;Wash/dry hands;Wash/dry face;Min guard Grooming Details (indicate cue type and reason): Patient stood at sink to perform grooming task with min guard from therapist. Limited standing tolerance with patient reporting fatigue.                 Toilet Transfer: Min guard;Ambulation;Grab bars;RW;Regular Teacher, adult education Details (indicate cue type and reason): Patient ambulated to bathroom with min guard. Use of grab bars and min guard from therapist to sit and stand. Toileting- Clothing Manipulation and Hygiene: Set up;Sit to/from stand Toileting - Clothing Manipulation Details (indicate cue type and reason): Patient performed pericare in seated position. Able to manage gown without assistance. Thearpist provided cleaning cloths.     Functional mobility during ADLs: Min guard;Rolling walker       Vision Patient Visual Report: No change from baseline Vision Assessment?: No apparent visual deficits   Perception     Praxis      Cognition Arousal/Alertness: Awake/alert Behavior During Therapy: WFL for tasks assessed/performed Overall Cognitive Status: Within Functional Limits for tasks assessed  General Comments: AxO x 3 feeling "really bad' and "in a lot pain"        Exercises     Shoulder Instructions       General Comments      Pertinent Vitals/ Pain       Pain Assessment: No/denies pain Pain Score: 8  Pain Location: low back Pain Descriptors / Indicators: Discomfort;Restless;Grimacing Pain  Intervention(s): Monitored during session;Patient requesting pain meds-RN notified;Repositioned  Home Living                                          Prior Functioning/Environment              Frequency  Min 2X/week        Progress Toward Goals  OT Goals(current goals can now be found in the care plan section)  Progress towards OT goals: Progressing toward goals  Acute Rehab OT Goals Patient Stated Goal: to go home OT Goal Formulation: With patient Time For Goal Achievement: 04/30/20 Potential to Achieve Goals: Good  Plan Discharge plan remains appropriate    Co-evaluation                 AM-PAC OT "6 Clicks" Daily Activity     Outcome Measure   Help from another person eating meals?: A Little Help from another person taking care of personal grooming?: A Little Help from another person toileting, which includes using toliet, bedpan, or urinal?: A Little Help from another person bathing (including washing, rinsing, drying)?: A Little Help from another person to put on and taking off regular upper body clothing?: A Little Help from another person to put on and taking off regular lower body clothing?: A Lot 6 Click Score: 17    End of Session Equipment Utilized During Treatment: Rolling walker;Gait belt  OT Visit Diagnosis: Other abnormalities of gait and mobility (R26.89);Muscle weakness (generalized) (M62.81);Other symptoms and signs involving cognitive function   Activity Tolerance Patient tolerated treatment well   Patient Left with call bell/phone within reach;with chair alarm set;with family/visitor present;in bed   Nurse Communication  (okay to see per RN)        Time: 0109-3235 OT Time Calculation (min): 20 min  Charges: OT General Charges $OT Visit: 1 Visit OT Treatments $Self Care/Home Management : 8-22 mins  Waldron Session, OTR/L Acute Care Rehab Services  Office [phone] Pager: [phone]    Kelli Churn 04/25/2020, 4:57 PM

## 2020-04-25 NOTE — Progress Notes (Signed)
Physical Therapy Treatment Patient Details Name: Gina Hooper MRN: [id number] DOB: [birth year] Today's Date: 04/25/2020    History of Present Illness Pt admitted with acute encephalopathy and noted to be in acute liver failure in the setting of Alchoholic Hepatitis with profound coagulopathy and hematemesis. ETT x9 days. Pt with hx of ETOH and polysubstance abuse as well as osteomylitis of L ankle    PT Comments    Assisted OOB.  General bed mobility comments: assisted OOB with HOB elevated and use of rails then assisted back to bed with increased assist due to increased low back back pain.  General transfer comment: increased time to rise due to pain and also assisted with a toilet transfer.  General Gait Details: limited gait distance to and from bathroom due to low back pain. Assisted back to bed per pt request.  Feeling tired.   Follow Up Recommendations  CIR     Equipment Recommendations  Rolling walker with 5" wheels;3in1 (PT);Wheelchair (measurements PT);Wheelchair cushion (measurements PT)    Recommendations for Other Services Rehab consult     Precautions / Restrictions Precautions Precautions: Fall    Mobility  Bed Mobility Overal bed mobility: Needs Assistance Bed Mobility: Supine to Sit;Sit to Supine     Supine to sit: Min assist Sit to supine: Min assist;Mod assist   General bed mobility comments: assisted OOB with HOB elevated and use of rails then assisted back to bed with increased assist due to increased low back back pain  Transfers Overall transfer level: Needs assistance Equipment used: None Transfers: Sit to/from [employer] Sit to Stand: Min guard Stand pivot transfers: Min guard;Min assist       General transfer comment: increased time to rise due to pain and also assisted with a toilet transfer  Ambulation/Gait Ambulation/Gait assistance: Min guard;Min assist Gait Distance (Feet): 14 Feet (7 feet x 2 to and from bathroom  only) Assistive device: Rolling walker (2 wheeled) Gait Pattern/deviations: Step-through pattern;Decreased stride length;Shuffle;Narrow base of support Gait velocity: decreased   General Gait Details: limited gait distance to and from bathroom due to low back pain   Stairs             Wheelchair Mobility    Modified Rankin (Stroke Patients Only)       Balance                                            Cognition Arousal/Alertness: Awake/alert Behavior During Therapy: WFL for tasks assessed/performed Overall Cognitive Status: Within Functional Limits for tasks assessed                                 General Comments: AxO x 3 feeling "really bad' and "in a lot pain"      Exercises      General Comments        Pertinent Vitals/Pain Pain Assessment: 0-10 Pain Score: 8  Pain Location: low back Pain Descriptors / Indicators: Discomfort;Restless;Grimacing Pain Intervention(s): Monitored during session;Patient requesting pain meds-RN notified;Repositioned    Home Living                      Prior Function            PT Goals (current goals can now be found in the care plan  section) Progress towards PT goals: Progressing toward goals    Frequency    Min 3X/week      PT Plan Current plan remains appropriate    Co-evaluation              AM-PAC PT "6 Clicks" Mobility   Outcome Measure  Help needed turning from your back to your side while in a flat bed without using bedrails?: A Little Help needed moving from lying on your back to sitting on the side of a flat bed without using bedrails?: A Little Help needed moving to and from a bed to a chair (including a wheelchair)?: A Little Help needed standing up from a chair using your arms (e.g., wheelchair or bedside chair)?: A Little Help needed to walk in hospital room?: A Little Help needed climbing 3-5 steps with a railing? : A Lot 6 Click Score: 17     End of Session Equipment Utilized During Treatment: Gait belt Activity Tolerance: No increased pain Patient left: in bed;with call bell/phone within reach Nurse Communication: Mobility status PT Visit Diagnosis: Unsteadiness on feet (R26.81);Muscle weakness (generalized) (M62.81);Difficulty in walking, not elsewhere classified (R26.2)     Time: 7253-6644 PT Time Calculation (min) (ACUTE ONLY): 27 min  Charges:  $Gait Training: 8-22 mins $Therapeutic Activity: 8-22 mins                     Felecia Shelling  PTA Acute  Rehabilitation Services Pager      [phone] Office      [phone]

## 2020-04-25 NOTE — Progress Notes (Signed)
Inpatient Rehab Admissions Coordinator:   Spoke with pt over the phone, as I will be following for my colleague, Wolfgang Phoenix.  Pt continues to state that she is opposed to CIR stay due to cost, and that her plan is to d/c home to her grandmother's house with her grandmother providing supervision.  I attempted to call pt's father to confirm plan, as pt is progressing well with PT and may be able to d/c straight home.  No answer and voicemail box full.  I will try again tomorrow.   Estill Dooms, PT, DPT Admissions Coordinator [phone] 04/25/20  3:52 PM

## 2020-04-25 NOTE — Progress Notes (Signed)
PROGRESS NOTE    Gina Hooper  EVO:[id number] DOB: [birth year] DOA: 04/04/2020 PCP: Patient, No Pcp Per   Chief Complaint  Patient presents with  . Emesis  . Hypoglycemia   Brief Narrative: 29 year old female with sudden severe alcohol abuse depression anemia history presented initially 04/04/2020 regarding pain, nausea vomiting, hypoglycemia at home.  In the ED found to have acute liver failure AKI creatinine 1.2 ALP 147 AST more than 10,000 ALT 4566 total bili 4.8 INR 9.9.  Patient was admitted underwent extensive hospitalization further evaluation with GI, treated with MAC, completed 04/07/2020, continued on IV Solu-Medrol, evaluated from a transplant standpoint and deemed not a candidate due to her underlying substance abuse and lack of insurance.  Hospital course complicated by hematemesis on 8/12 due to coagulopathy, felt to be high risk and poor endoscopy candidate and treated with PPI.  Patient developed progressive worsening mentation and became minimally responsive encephalopathic needing intubation 04/06/2020, MRI 8/14 no acute finding, seen by neurology, EEG 8/16 severe diffuse encephalopathy, no seizure, extubated 8/22, transferred to [facility] 8/24.  Pt was placed on Precedex overnight 8/23 due to significant agitation and off  8/24 night. She is waking up more. SLP has placed on diet. PTOT is working with her Patient transfer out of stepdown unit 8/27.  Subjective: Seen this morning.  Patient reports she walked in room and or bathroom with PT, Feels well, peeing well. Not dizzy anymore Starting lasix po, stopping ivf Assessment & Plan:  Acute liver failure/Jaundice in the setting of acute alcohol hepatitis +/- possible Tylenol toxicity/possible EXH:BZJI by GI, was discussed with the transplant team while in ICU and not felt to be a candidate for liver transplant.MELD-na 40,discriminant function 304 on admission.  LFTs had significantly improved but total bili remains high at 25.9 but  AST ALT at 79/80, last INR at 1.4.continue current supportive measures.  Patient completed ceftriaxone for possible SBP.starting Aldactone/diuretics Recent Labs  Lab 04/20/20 0304 04/20/20 0304 04/21/20 0357 04/22/20 0400 04/23/20 1002 04/24/20 0355 04/25/20 0419  AST 196*   < > 172* 134* 97* 83* 79*  ALT 244*   < > 209* 166* 104* 82* 80*  ALKPHOS 190*   < > 187* 164* 119 100 99  BILITOT 28.3*   < > 26.8* 24.0* 27.7* 23.6* 25.9*  PROT 5.5*   < > 5.3* 5.1* 6.2* 5.1* 5.2*  ALBUMIN 1.8*   < > 1.8* 2.0* 3.8 2.7* 2.5*  INR 1.4*  --   --   --   --   --   --    < > = values in this interval not displayed.   Acute hepatic/metabolic encephalopathy/acute alcohol withdrawal: Mental status normalizing stable.  She was seen by seen by neurology, MRI brain no acute finding EEG with diffuse encephalopathy.  She is off restraint and off Precedex 8/24 . Cont Lactulose, rifaximin folic acid multivitamin thiamine.  Moderate ascites/anasarca/bilateral pleural effusion:Patient has gained at least 33 pounds since admission. 147>> 180 with lasix, significant uop and wt 165> 162.  Lasix diabetes significant diuresis 12 L in a day.  Potassium normalized ,starting on Aldactone and Lasix.   Hypokalemia: Improved with repletion.    Hypomagnesemia was repleted.    FEN: Encourage oral intake stop IV fluids   Hypernatremia: resolved  Acute respiratory failure due to liver failure hepatoencephalopathy needing intubation-on vent for 9 days.  On room air currently.  Had some throat irritation  Coagulopathy secondary to liver failure. monitor INR.  Appears overall stable  Upper GI bleed/hematemesis in the setting of gastritis/coagulopathy seen by GI poor endoscopy candidate monitor hb.continue PPI. Anemia acute blood loss less/anemia of critical illness: s/p  1 unit PRBC hemoglobin stable at 8.8 g.  Monitor   Recent Labs  Lab 04/23/20 0417 04/23/20 1842 04/24/20 0355 04/24/20 1750 04/25/20 0419  HGB 7.0*  7.1* 6.2* 8.9* 8.8*  HCT 21.0* 22.0* 19.4* 26.9* 26.5*   Thrombocytopenia:in the setting of liver disease and alcohol abuse: Platelet  70k-90k monitor  Recent Labs  Lab 04/21/20 0357 04/22/20 0400 04/23/20 0417 04/24/20 0355 04/25/20 0419  PLT 97* 96* 89* 78* 90*   Moderate protein calorie malnutrition: Continue to augment diet  CoNS in blood cx 1 bottle-likely contamination.  Leukocytosis WBC downtrending.completed antibiotics.  Recent Labs  Lab 04/21/20 0357 04/22/20 0400 04/23/20 0417 04/24/20 0355 04/25/20 0419  WBC 13.3* 12.2* 12.7* 10.9* 11.4*   Deconditioning:PT/OT consulted , continue to mobilize her.    Foley + patient has Foley back in place for diuresis.  Was discontinued 8/27 and placed back in 8/28 for fiuresis. D/c foley today  DVT prophylaxis: SCDs Start: 04/04/20 1659 Code Status:   Code Status: Full Code  Family Communication: Plan of care discussed with patient at bedside. Updated her father 8/29-he reports he is involved in her care and will take her to his home in [place] after discharge and not planning her to return to her grandmother and prefers CIR. sister at the bedside updated 8/30.  Status JJ:HERDEYCXK. Remains inpatient appropriate because:IV treatments appropriate due to intensity of illness or inability to take PO and Inpatient level of care appropriate due to severity of illness Dispo: The patient is from: Home              Anticipated d/c is to: CIR,consulted.              Anticipated d/c date is: > 3 days              Patient currently is not medically stable to d/c. Nutrition: Diet Order            DIET DYS 3 Room service appropriate? Yes with Assist; Fluid consistency: Thin  Diet effective now                 Nutrition Problem: Inadequate oral intake Etiology: inability to eat Signs/Symptoms: NPO status Interventions: Refer to RD note for recommendations Body mass index is 28.47 kg/m.  Consultants:see note   Procedures: UDS 8/11 >> opiate / THC / cocaine positive  CT ABD 8/11 >> borderline hepatomegaly with diffuse patchy low-attenuation with concern for acute inflammation/hepatitis.  Gallbladder wall thickening with pericholecystic fluid felt reactive there were no gallstones.  There is a small right effusion.  Mild ascites in the pelvis. MRI Brain 8/14 >> no acute intracranial process. Mild pansinus disease  EEG 8/16 >> suggestive of severe diffuse encephalopathy, non-specific etiology ECHO 8/19 >> LVEF 65-70%, no RWMA, RV systolic function normal LE Venous Duplex 8/19 >> negative  CT ABD/PELVIS 04/22/20 1.Moderate ascites and anasarca, new since the prior CT. 2. Interval increase in the size of bilateral pleural effusion and associated atelectasis or infiltrate. 3. Thickened appearance of the wall of the stomach and loops of small bowel, likely related to ascites. Clinical correlation is recommended to evaluate for possibility of gastroenteritis. No bowel obstruction. 4. Uterine fibroid. 5. A 2 cm right ovarian dominant follicle or cyst  Microbiology:see note Blood Culture    Component Value Date/Time   SDES  01/14/2018 0005    BLOOD RIGHT FOREARM Performed at [facility], [address] [place], [place] [address]    [facility]  01/14/2018 0005    BOTTLES DRAWN AEROBIC AND ANAEROBIC Blood Culture adequate volume Performed at [facility], [address] [place], [place] [address]    CULT (A) 01/14/2018 0005    STAPHYLOCOCCUS SPECIES (COAGULASE NEGATIVE) THE SIGNIFICANCE OF ISOLATING THIS ORGANISM FROM A SINGLE SET OF BLOOD CULTURES WHEN MULTIPLE SETS ARE DRAWN IS UNCERTAIN. PLEASE NOTIFY THE MICROBIOLOGY DEPARTMENT WITHIN ONE WEEK IF SPECIATION AND SENSITIVITIES ARE REQUIRED. Performed at [facility], [address], [place], [place] [address]    REPTSTATUS 01/17/2018 FINAL 01/14/2018 0005    Other culture-see note  Medications: Scheduled  Meds: . sodium chloride   Intravenous Once  . Chlorhexidine Gluconate Cloth  6 each Topical Daily  . feeding supplement (ENSURE ENLIVE)  237 mL Oral TID BM  . folic acid  1 mg Oral Daily  . furosemide  20 mg Oral Daily  . insulin aspart  0-6 Units Subcutaneous Q4H  . lactulose  30 g Oral Daily  . magnesium oxide  400 mg Oral BID  . mouth rinse  15 mL Mouth Rinse BID  . multivitamin with minerals  1 tablet Oral Daily  . pantoprazole  40 mg Oral Daily  . rifaximin  550 mg Oral BID  . sodium chloride flush  10-40 mL Intracatheter Q12H  . sodium chloride flush  10-40 mL Intracatheter Q12H  . spironolactone  25 mg Oral Daily  . tamsulosin  0.4 mg Oral QPC supper  . thiamine  100 mg Oral Daily   Or  . thiamine  100 mg Intravenous Daily   Continuous Infusions:   Antimicrobials: Anti-infectives (From admission, onward)   Start     Dose/Rate Route Frequency Ordered Stop   04/18/20 2215  rifaximin (XIFAXAN) tablet 550 mg        550 mg Oral 2 times daily 04/18/20 2208     04/15/20 1000  rifaximin (XIFAXAN) tablet 550 mg  Status:  Discontinued        550 mg Per Tube 2 times daily 04/15/20 0856 04/18/20 2209   04/12/20 1100  rifaximin (XIFAXAN) tablet 550 mg  Status:  Discontinued        550 mg Per Tube 2 times daily 04/12/20 1024 04/15/20 0856   04/12/20 1100  cefTRIAXone (ROCEPHIN) 2 g in sodium chloride 0.9 % 100 mL IVPB  Status:  Discontinued        2 g 200 mL/hr over 30 Minutes Intravenous Every 24 hours 04/12/20 1044 04/22/20 1211       Objective: Vitals: Today's Vitals   04/24/20 2200 04/25/20 0236 04/25/20 0421 04/25/20 0847  BP:   106/81   Pulse:   93   Resp:   20   Temp:   99.3 F (37.4 C)   TempSrc:   Oral   SpO2:   95%   Weight:  80 kg    Height:      PainSc: 0-No pain   8     Intake/Output Summary (Last 24 hours) at 04/25/2020 1025 Last data filed at 04/25/2020 0857 Gross per 24 hour  Intake 565 ml  Output 1275 ml  Net -710 ml   Filed Weights   04/23/20  0405 04/24/20 0434 04/25/20 0236  Weight: 75.1 kg 73.8 kg 80 kg   Weight change: 6.2 kg   Intake/Output from  previous day: 08/31 0701 - 09/01 0700 In: 575 [I.V.:260; Blood:315] Out: 1025 [Urine:1025] Intake/Output this shift: Total I/O In: 240 [P.O.:240] Out: 250 [Urine:250]  Examination:  General exam: AAOx3 at baseline, NAD, weak appearing.  Appears older than stated age HEENT:Oral mucosa moist, Ear/Nose WNL grossly, dentition normal.  Icterus present Respiratory system: bilaterally clear,no wheezing or crackles,no use of accessory muscle Cardiovascular system: S1 & S2 +, No JVD,. Gastrointestinal system: Abdomen soft, NT,ND, BS+ Nervous System:Alert, awake, moving extremities and grossly nonfocal Extremities: No edema, distal peripheral pulses palpable.  Skin: No rashes,no icterus. MSK: Normal muscle bulk,tone, power  Data Reviewed: I have personally reviewed following labs and imaging studies CBC: Recent Labs  Lab 04/21/20 0357 04/21/20 0357 04/22/20 0400 04/22/20 0400 04/23/20 0417 04/23/20 1842 04/24/20 0355 04/24/20 1750 04/25/20 0419  WBC 13.3*  --  12.2*  --  12.7*  --  10.9*  --  11.4*  NEUTROABS 11.2*  --  10.0*  --  10.3*  --  8.2*  --  8.6*  HGB 8.3*   < > 7.4*   < > 7.0* 7.1* 6.2* 8.9* 8.8*  HCT 25.3*   < > 23.2*   < > 21.0* 22.0* 19.4* 26.9* 26.5*  MCV 88.2  --  89.9  --  89.4  --  91.1  --  90.1  PLT 97*  --  96*  --  89*  --  78*  --  90*   < > = values in this interval not displayed.   Basic Metabolic Panel: Recent Labs  Lab 04/19/20 0219 04/19/20 0219 04/20/20 0304 04/20/20 0304 04/21/20 0357 04/21/20 1715 04/22/20 0400 04/22/20 0400 04/23/20 1002 04/23/20 1842 04/24/20 0355 04/24/20 1750 04/25/20 0419  NA 137   < > 135   < > 135  --  136  --  134*  --  132*  --  132*  K 3.6   < > 3.2*   < > 2.9*   < > 3.8   < > 2.1* 2.5* 3.2* 4.1 3.9  CL 110   < > 109   < > 107  --  107  --  92*  --  95*  --  99  CO2 19*   < > 19*   < > 21*  --   20*  --  29  --  27  --  27  GLUCOSE 97   < > 98   < > 121*  --  99  --  90  --  80  --  80  BUN 14   < > 12   < > 12  --  11  --  9  --  9  --  9  CREATININE <0.30*   < > 0.41*   < > 0.68  --  0.78  --  0.64  --  0.63  --  0.55  CALCIUM 7.8*   < > 7.9*   < > 8.0*  --  8.2*  --  8.4*  --  8.1*  --  8.2*  MG  --   --   --   --   --   --   --   --  1.1*  --   --   --   --   PHOS 3.4  --  3.3  --   --   --   --   --  3.9  --   --   --   --    < > =  values in this interval not displayed.   GFR: Estimated Creatinine Clearance: 110.7 mL/min (by C-G formula based on SCr of 0.55 mg/dL). Liver Function Tests: Recent Labs  Lab 04/21/20 0357 04/22/20 0400 04/23/20 1002 04/24/20 0355 04/25/20 0419  AST 172* 134* 97* 83* 79*  ALT 209* 166* 104* 82* 80*  ALKPHOS 187* 164* 119 100 99  BILITOT 26.8* 24.0* 27.7* 23.6* 25.9*  PROT 5.3* 5.1* 6.2* 5.1* 5.2*  ALBUMIN 1.8* 2.0* 3.8 2.7* 2.5*   No results for input(s): LIPASE, AMYLASE in the last 168 hours. No results for input(s): AMMONIA in the last 168 hours. Coagulation Profile: Recent Labs  Lab 04/20/20 0304  INR 1.4*   Cardiac Enzymes: No results for input(s): CKTOTAL, CKMB, CKMBINDEX, TROPONINI in the last 168 hours. BNP (last 3 results) No results for input(s): PROBNP in the last 8760 hours. HbA1C: No results for input(s): HGBA1C in the last 72 hours. CBG: Recent Labs  Lab 04/24/20 1626 04/24/20 2021 04/25/20 0008 04/25/20 0415 04/25/20 0731  GLUCAP 94 114* 125* 71 128*   Lipid Profile: No results for input(s): CHOL, HDL, LDLCALC, TRIG, CHOLHDL, LDLDIRECT in the last 72 hours. Thyroid Function Tests: No results for input(s): TSH, T4TOTAL, FREET4, T3FREE, THYROIDAB in the last 72 hours. Anemia Panel: No results for input(s): VITAMINB12, FOLATE, FERRITIN, TIBC, IRON, RETICCTPCT in the last 72 hours. Sepsis Labs: No results for input(s): PROCALCITON, LATICACIDVEN in the last 168 hours.  No results found for this or any  previous visit (from the past 240 hour(s)).    Radiology Studies: No results found.   LOS: 21 days   Gina Pert, MD [facility]  04/25/2020, 10:25 AM

## 2020-04-26 DIAGNOSIS — K7201 Acute and subacute hepatic failure with coma: Secondary | ICD-10-CM

## 2020-04-26 LAB — COMPREHENSIVE METABOLIC PANEL
ALT: 71 U/L — ABNORMAL HIGH (ref 0–44)
AST: 75 U/L — ABNORMAL HIGH (ref 15–41)
Albumin: 2.4 g/dL — ABNORMAL LOW (ref 3.5–5.0)
Alkaline Phosphatase: 100 U/L (ref 38–126)
Anion gap: 9 (ref 5–15)
BUN: 8 mg/dL (ref 6–20)
CO2: 25 mmol/L (ref 22–32)
Calcium: 8.1 mg/dL — ABNORMAL LOW (ref 8.9–10.3)
Chloride: 98 mmol/L (ref 98–111)
Creatinine, Ser: 0.6 mg/dL (ref 0.44–1.00)
GFR calc Af Amer: >60 mL/min (ref >60)
GFR calc non Af Amer: >60 mL/min (ref >60)
Glucose, Bld: 80 mg/dL (ref 70–99)
Potassium: 3.2 mmol/L — ABNORMAL LOW (ref 3.5–5.1)
Sodium: 132 mmol/L — ABNORMAL LOW (ref 135–145)
Total Bilirubin: 23.3 mg/dL (ref 0.3–1.2)
Total Protein: 5 g/dL — ABNORMAL LOW (ref 6.5–8.1)

## 2020-04-26 LAB — CBC WITH DIFFERENTIAL/PLATELET
Abs Immature Granulocytes: 0.12 10*3/uL — ABNORMAL HIGH (ref 0.00–0.07)
Basophils Absolute: 0 10*3/uL (ref 0.0–0.1)
Basophils Relative: 0 %
Eosinophils Absolute: 0.1 10*3/uL (ref 0.0–0.5)
Eosinophils Relative: 1 %
HCT: 26.5 % — ABNORMAL LOW (ref 36.0–46.0)
Hemoglobin: 8.6 g/dL — ABNORMAL LOW (ref 12.0–15.0)
Immature Granulocytes: 1 %
Lymphocytes Relative: 12 %
Lymphs Abs: 1.3 10*3/uL (ref 0.7–4.0)
MCH: 30.1 pg (ref 26.0–34.0)
MCHC: 32.5 g/dL (ref 30.0–36.0)
MCV: 92.7 fL (ref 80.0–100.0)
Monocytes Absolute: 0.8 10*3/uL (ref 0.1–1.0)
Monocytes Relative: 8 %
Neutro Abs: 8.1 10*3/uL — ABNORMAL HIGH (ref 1.7–7.7)
Neutrophils Relative %: 78 %
Platelets: 100 10*3/uL — ABNORMAL LOW (ref 150–400)
RBC: 2.86 MIL/uL — ABNORMAL LOW (ref 3.87–5.11)
WBC: 10.5 10*3/uL (ref 4.0–10.5)
nRBC: 0 % (ref 0.0–0.2)

## 2020-04-26 LAB — GLUCOSE, CAPILLARY
Glucose-Capillary: 114 mg/dL — ABNORMAL HIGH (ref 70–99)
Glucose-Capillary: 122 mg/dL — ABNORMAL HIGH (ref 70–99)
Glucose-Capillary: 81 mg/dL (ref 70–99)
Glucose-Capillary: 81 mg/dL (ref 70–99)
Glucose-Capillary: 82 mg/dL (ref 70–99)
Glucose-Capillary: 93 mg/dL (ref 70–99)

## 2020-04-26 MED ORDER — POTASSIUM CHLORIDE CRYS ER 20 MEQ PO TBCR
20.0000 meq | EXTENDED_RELEASE_TABLET | Freq: Two times a day (BID) | ORAL | Status: DC
  Administered 2020-04-26 – 2020-05-01 (×11): 20 meq via ORAL
  Filled 2020-04-26 (×11): qty 1

## 2020-04-26 MED ORDER — BOOST / RESOURCE BREEZE PO LIQD CUSTOM
1.0000 | Freq: Three times a day (TID) | ORAL | Status: DC
  Administered 2020-04-26 – 2020-05-01 (×12): 1 via ORAL

## 2020-04-26 MED ORDER — FUROSEMIDE 10 MG/ML IJ SOLN
20.0000 mg | Freq: Every day | INTRAMUSCULAR | Status: DC
  Administered 2020-04-26 – 2020-05-01 (×5): 20 mg via INTRAVENOUS
  Filled 2020-04-26 (×6): qty 2

## 2020-04-26 MED ORDER — POTASSIUM CHLORIDE CRYS ER 20 MEQ PO TBCR
40.0000 meq | EXTENDED_RELEASE_TABLET | Freq: Once | ORAL | Status: AC
  Administered 2020-04-26: 40 meq via ORAL
  Filled 2020-04-26: qty 2

## 2020-04-26 NOTE — Progress Notes (Signed)
  Speech Language Pathology Treatment: Dysphagia  Patient Details Name: MAZELLE HUEBERT MRN: [id number] DOB: Jul 10, 1991 Today's Date: 04/26/2020 Time: 4132-4401 SLP Time Calculation (min) (ACUTE ONLY): 27 min  Assessment / Plan / Recommendation Clinical impressions: Today pt was provided with [facility] IMT (inspiratory muscle trainer), benefiting from max verbal cues to conduct at adequate strength level.  With visual, verbal encouragement, pt performed with Mod I cues. Trainer was set at 11 cmH20 pressure clinically based on pt's trials and expression of work effort.  Pt performed 15 total with IMT and 10 with EMT reporting level 7 work load overall.  Instructed pt to conduct 3 times a day - 10 times each set with both trainers for 12-16 weeks for any functional improvement to be noted.  If pt's voice does not improve, OP ENT referral advised.  Using teach back, pt verbalized the purpose of conducting exercises and states "I will do it to protect my lungs."    Pt continues to make significant improvement but does benefit from encouragement to conduct exercises.  Signs posted in room for reminders for pt.   If pt remains in hospital, will follow up next week.    HPI HPI: 29 yo female adm to [facility] with AMS alcoholic dependence and polysubstance abuse. She presented to the ED on 8/11 with abdominal pain and N/V. She was found to be in acute liver failure in the setting of alcoholic hepatitis with profound coagulopathy and hematemesis. CCM evaluated 8/12 due to declining mental status and concern about IV access. Pt was intubated x9 days- extubated yesterday am 04/15/2020. Swallow eval ordered.  Pt was started on a modified diet yesterday but per RN, she was coughing excessively with it and the trays were held.  MBS conducted with pt started on dys3/thin diet.  Follow up to assess po tolerance and initiate RMST conducted.      SLP Plan  Continue with current plan of care        Recommendations  Diet recommendations: Regular Liquids provided via: Straw;Cup Medication Administration: Whole meds with puree (or with liquids) Supervision: Patient able to self feed Compensations: Slow rate;Small sips/bites (tuck chin with liquids) Postural Changes and/or Swallow Maneuvers: Seated upright 90 degrees;Upright 30-60 min after meal                Oral Care Recommendations: Oral care QID Follow up Recommendations: None SLP Visit Diagnosis: Dysphagia, oropharyngeal phase (R13.12) Plan: Continue with current plan of care       GO                Chales Abrahams 04/26/2020, 12:11 PM  Rolena Infante, MS [facility] SLP Acute Rehab Services Office [phone]

## 2020-04-26 NOTE — Progress Notes (Signed)
Nutrition Follow-up  DOCUMENTATION CODES:   Not applicable  INTERVENTION:  D/c Ensure  Boost Breeze po TID, each supplement provides 250 kcal and 9 grams of protein  Magic cup TID with meals, each supplement provides 290 kcal and 9 grams of protein   NUTRITION DIAGNOSIS:   Inadequate oral intake related to inability to eat as evidenced by NPO status. -- Progressing, pt now on regular diet   GOAL:   Patient will meet greater than or equal to 90% of their needs -- Not met   MONITOR:   Diet advancement, Labs, Weight trends  REASON FOR ASSESSMENT:   Ventilator, Consult Enteral/tube feeding initiation and management  ASSESSMENT:   29 year old female with medical history of severe alcoholic dependence and polysubstance abuse. She presented to the ED on 8/11 with abdominal pain and N/V. She was found to be in acute liver failure in the setting of alcoholic hepatitis with profound coagulopathy and hematemesis. CCM evaluated 8/12 due to declining mental status and concern about IV access.  8/11- admission 8/13- intubation; OGT placement; MRI head negative 8/16- initial RD assessment; TF initiation; EEG showing diffuse encephalopathy 8/22- extubation; OGT removed 8/23 diet advanced to full liquids (nectar thick) 8/24 diet advanced to dysphagia 3 with thin liquids 9/1 diet advanced to regular   Pt unavailable at time of RD visit. Pt's mental status is noted to be normalizing. Limited PO documentation available. Pt noted to have refused meals yesterday. Per RN, pt also refusing Ensure Enlive. Will order Boost Breeze and [employer] in hopes of increasing pt's protein/kcal intake. If pt's po intake does not improve, recommend initiation of enteral nutrition.  Labs: Na 132 (L), K+ 3.2 (L), CBGs 81-82-122 Medications: Folvite, Lasix, Novolog, Chronulac, MVI, Protonix, Klor-con, Aldactone, Thiamine  Diet Order:   Diet Order            Diet regular Room service appropriate? Yes with  Assist; Fluid consistency: Thin  Diet effective now                 EDUCATION NEEDS:   No education needs have been identified at this time  Skin:  Skin Assessment: Reviewed RN Assessment  Last BM:  8/31  Height:   Ht Readings from Last 1 Encounters:  04/04/20 $RemoveB'5\' 6"'VzuSwKmW$  (1.676 m)    Weight:   Wt Readings from Last 1 Encounters:  04/26/20 79.9 kg    BMI:  Body mass index is 28.43 kg/m.  Estimated Nutritional Needs:   Kcal:  2005-2210 kcal  Protein:  100-110 grams  Fluid:  >/= 2.2 L/day    Larkin Ina, MS, RD, LDN RD pager number and weekend/on-call pager number located in [place].

## 2020-04-26 NOTE — Progress Notes (Signed)
Inpatient Rehab Admissions Coordinator:   I was able to reach pt's father, Gina Hooper, over the phone.  We discussed pt's CLOF (min assist to min guard for limited household distances), and that she would benefit from some sort of follow up with therapy.  I do not think that she needs 3 hours of therapy per day, nor would she require a 3 day minimum length of stay.  [name] states that he would like for pt to d/c home with him so that he and his brother can care for her.  He states that her living situation with her grandmother was one that has led to her polysubstance abuse and he would like to help her be safe.  He is open to f/u therapy in [place] (where he lives) if it can be arranged.  He is okay not pursuing CIR at this time.  I let Dr. Jonathon Bellows and Minerva Areola with [facility] team know.  I will sign off for CIR at this time.   Estill Dooms, PT, DPT Admissions Coordinator [phone] 04/26/20  1:30 PM

## 2020-04-26 NOTE — Progress Notes (Signed)
Pt said she was too tired to try and take her medication. Education provided on importance of medication regimen. Continued to refuse meds.

## 2020-04-26 NOTE — Progress Notes (Signed)
PROGRESS NOTE    Gina Hooper  OFB:[id number] DOB: 1991/07/27 DOA: 04/04/2020 PCP: Patient, No Pcp Per   Chief Complaint  Patient presents with  . Emesis  . Hypoglycemia   Brief Narrative: 29 year old female with sudden severe alcohol abuse depression anemia history presented initially 04/04/2020 regarding pain, nausea vomiting, hypoglycemia at home.  In the ED found to have acute liver failure AKI creatinine 1.2 ALP 147 AST more than 10,000 ALT 4566 total bili 4.8 INR 9.9.  Patient was admitted underwent extensive hospitalization further evaluation with GI, treated with MAC, completed 04/07/2020, continued on IV Solu-Medrol, evaluated from a transplant standpoint and deemed not a candidate due to her underlying substance abuse and lack of insurance.  Hospital course complicated by hematemesis on 8/12 due to coagulopathy, felt to be high risk and poor endoscopy candidate and treated with PPI.  Patient developed progressive worsening mentation and became minimally responsive encephalopathic needing intubation 04/06/2020, MRI 8/14 no acute finding, seen by neurology, EEG 8/16 severe diffuse encephalopathy, no seizure, extubated 8/22, transferred to [facility] 8/24.  Pt was placed on Precedex overnight 8/23 due to significant agitation and off  8/24 night. She is waking up more. SLP has placed on diet. PTOT is working with her Patient transfer out of stepdown unit 8/27.  Subjective: This morning.  No nausea vomiting.  Patient reports he is overall feeling better. WEIGHT is going up at 176.5 pound from 162 on p.o. Lasix. Urine output slowed down to 800 mL.   k low For RN this am she Feels too weak to take medicine.  Assessment & Plan:  Acute liver failure/Jaundice in the setting of acute alcohol hepatitis +/- possible Tylenol toxicity/possible POE:UMPN by GI, was discussed with the transplant team while in ICU and not felt to be a candidate for liver transplant.MELD-na 40,discriminant function 304  on admission.  Liver function tests are significantly improved, total bili remains up 23. last INR at 1.4.continue on current supportive measures.patient completed ceftriaxone for possible SBP.starting Aldactone/diuretics Recent Labs  Lab 04/20/20 0304 04/21/20 0357 04/22/20 0400 04/23/20 1002 04/24/20 0355 04/25/20 0419 04/26/20 0357  AST 196*   < > 134* 97* 83* 79* 75*  ALT 244*   < > 166* 104* 82* 80* 71*  ALKPHOS 190*   < > 164* 119 100 99 100  BILITOT 28.3*   < > 24.0* 27.7* 23.6* 25.9* 23.3*  PROT 5.5*   < > 5.1* 6.2* 5.1* 5.2* 5.0*  ALBUMIN 1.8*   < > 2.0* 3.8 2.7* 2.5* 2.4*  INR 1.4*  --   --   --   --   --   --    < > = values in this interval not displayed.   Acute hepatic/metabolic encephalopathy/acute alcohol withdrawal: She is back to baseline mental status but weak and deconditioned.She was seen by seen by neurology, MRI brain no acute finding EEG with diffuse encephalopathy.  She is off restraint and off Precedex 8/24 . Cont Lactulose, rifaximin folic acid multivitamin thiamine.  Moderate ascites/anasarca/bilateral pleural effusion:Patient has gained at least 33 pounds since admission. 147>> 180 with lasix, significant uop and wt 165> 162. Has uop abt 12 l /24 hr after iv lasix- changed to po lasix but wt is at 176.5 pound from 162.  We will start her on Lasix IV 20 mg Lasix IV, continue Aldactone add scheduled potassium chloride along with 40x1 today  Hypokalemia: We will start on 20 KCl twice daily, add 20 KCl x1 today.  Monitor  mag.   Hypomagnesemia was rrepleted.    FEN: Encourage oral intake off IV fluids  Hypernatremia: resolved  Hyponatremia sodium low.  Monitor  Acute respiratory failure due to liver failure hepatoencephalopathy needing intubation-on vent for 9 days.  On room air currently.  Encourage incentive spirometry and ambulation.  Coagulopathy secondary to liver failure. monitor INR.  Appears overall stable  Upper GI bleed/hematemesis in the setting  of gastritis/coagulopathy seen by GI poor endoscopy candidate monitor hb.continue PPI. Anemia acute blood loss less/anemia of critical illness: s/p  1 unit PRBC hemoglobin has remained stable.  Monitor as below and transfuse if less than 7 g.  Recent Labs  Lab 04/23/20 1842 04/24/20 0355 04/24/20 1750 04/25/20 0419 04/26/20 0357  HGB 7.1* 6.2* 8.9* 8.8* 8.6*  HCT 22.0* 19.4* 26.9* 26.5* 26.5*   Thrombocytopenia:in the setting of liver disease and alcohol abuse:Platelet  Up at 100k  Recent Labs  Lab 04/22/20 0400 04/23/20 0417 04/24/20 0355 04/25/20 0419 04/26/20 0357  PLT 96* 89* 78* 90* 100*   Moderate protein calorie malnutrition:Continue to augment diet.  CoNS in blood cx 1 bottle:likely contamination.  Leukocytosis WBC downtrending.completed antibiotics.  Recent Labs  Lab 04/22/20 0400 04/23/20 0417 04/24/20 0355 04/25/20 0419 04/26/20 0357  WBC 12.2* 12.7* 10.9* 11.4* 10.5   Deconditioning:PT/OT consulted.Continue to mobilize her.    Foley + patient has Foley back in place for diuresis.  Was discontinued 8/27 and placed back in 8/28 for fiuresis. D/c foley  9/1.  DVT prophylaxis: SCDs Start: 04/04/20 1659 Code Status:   Code Status: Full Code  Family Communication: Plan of care discussed with patient at bedside. Updated her father 8/29-he reports he is involved in her care and will take her to his home in [place] after discharge and not planning her to return to her grandmother and prefers CIR.Sister at the bedside updated 8/30. No family at bedside.  Status ZC:HYIFOYDXA. Remains inpatient appropriate because:IV treatments appropriate due to intensity of illness or inability to take PO and Inpatient level of care appropriate due to severity of illness Dispo: The patient is from: Home              Anticipated d/c is to: Home health, difficult to arrange CIR              Anticipated d/c date is: > 3 days              Patient currently is not medically stable to  d/c. Nutrition: Diet Order            Diet regular Room service appropriate? Yes with Assist; Fluid consistency: Thin  Diet effective now                 Nutrition Problem: Inadequate oral intake Etiology: inability to eat Signs/Symptoms: NPO status Interventions: Refer to RD note for recommendations Body mass index is 28.43 kg/m.  Consultants:see note  Procedures: UDS 8/11 >> opiate / THC / cocaine positive  CT ABD 8/11 >> borderline hepatomegaly with diffuse patchy low-attenuation with concern for acute inflammation/hepatitis.  Gallbladder wall thickening with pericholecystic fluid felt reactive there were no gallstones.  There is a small right effusion.  Mild ascites in the pelvis. MRI Brain 8/14 >> no acute intracranial process. Mild pansinus disease  EEG 8/16 >> suggestive of severe diffuse encephalopathy, non-specific etiology ECHO 8/19 >> LVEF 65-70%, no RWMA, RV systolic function normal LE Venous Duplex 8/19 >> negative  CT ABD/PELVIS 04/22/20 1.Moderate ascites and anasarca, new since  the prior CT. 2. Interval increase in the size of bilateral pleural effusion and associated atelectasis or infiltrate. 3. Thickened appearance of the wall of the stomach and loops of small bowel, likely related to ascites. Clinical correlation is recommended to evaluate for possibility of gastroenteritis. No bowel obstruction. 4. Uterine fibroid. 5. A 2 cm right ovarian dominant follicle or cyst  Microbiology:see note Blood Culture    Component Value Date/Time   SDES  01/14/2018 0005    BLOOD RIGHT FOREARM Performed at [facility], [address] [place], [place] [address]    [facility]  01/14/2018 0005    BOTTLES DRAWN AEROBIC AND ANAEROBIC Blood Culture adequate volume Performed at [facility], [address] [place], [place] [address]    CULT (A) 01/14/2018 0005    STAPHYLOCOCCUS SPECIES (COAGULASE NEGATIVE) THE SIGNIFICANCE OF ISOLATING THIS  ORGANISM FROM A SINGLE SET OF BLOOD CULTURES WHEN MULTIPLE SETS ARE DRAWN IS UNCERTAIN. PLEASE NOTIFY THE MICROBIOLOGY DEPARTMENT WITHIN ONE WEEK IF SPECIATION AND SENSITIVITIES ARE REQUIRED. Performed at [facility], [place] [address], [place], [place] [address]    REPTSTATUS 01/17/2018 FINAL 01/14/2018 0005    Other culture-see note  Medications: Scheduled Meds: . Chlorhexidine Gluconate Cloth  6 each Topical Daily  . feeding supplement  1 Container Oral TID BM  . folic acid  1 mg Oral Daily  . furosemide  20 mg Intravenous Daily  . insulin aspart  0-6 Units Subcutaneous Q4H  . lactulose  30 g Oral Daily  . mouth rinse  15 mL Mouth Rinse BID  . multivitamin with minerals  1 tablet Oral Daily  . pantoprazole  40 mg Oral Daily  . potassium chloride  20 mEq Oral BID  . rifaximin  550 mg Oral BID  . sodium chloride flush  10-40 mL Intracatheter Q12H  . sodium chloride flush  10-40 mL Intracatheter Q12H  . spironolactone  25 mg Oral Daily  . tamsulosin  0.4 mg Oral QPC supper  . thiamine  100 mg Oral Daily   Or  . thiamine  100 mg Intravenous Daily   Continuous Infusions:   Antimicrobials: Anti-infectives (From admission, onward)   Start     Dose/Rate Route Frequency Ordered Stop   04/18/20 2215  rifaximin (XIFAXAN) tablet 550 mg        550 mg Oral 2 times daily 04/18/20 2208     04/15/20 1000  rifaximin (XIFAXAN) tablet 550 mg  Status:  Discontinued        550 mg Per Tube 2 times daily 04/15/20 0856 04/18/20 2209   04/12/20 1100  rifaximin (XIFAXAN) tablet 550 mg  Status:  Discontinued        550 mg Per Tube 2 times daily 04/12/20 1024 04/15/20 0856   04/12/20 1100  cefTRIAXone (ROCEPHIN) 2 g in sodium chloride 0.9 % 100 mL IVPB  Status:  Discontinued        2 g 200 mL/hr over 30 Minutes Intravenous Every 24 hours 04/12/20 1044 04/22/20 1211       Objective: Vitals: Today's Vitals   04/25/20 2250 04/26/20 0302 04/26/20 0603 04/26/20 0900  BP: 105/72  103/75     Pulse: 100  93   Resp: 20  20   Temp: 99.7 F (37.6 C)  98.9 F (37.2 C)   TempSrc: Oral  Oral   SpO2: 94%  95%   Weight:  79.9 kg    Height:  PainSc:    0-No pain    Intake/Output Summary (Last 24 hours) at 04/26/2020 1335 Last data filed at 04/26/2020 1140 Gross per 24 hour  Intake 100 ml  Output 1550 ml  Net -1450 ml   Filed Weights   04/24/20 0434 04/25/20 0236 04/26/20 0302  Weight: 73.8 kg 80 kg 79.9 kg   Weight change: -0.1 kg   Intake/Output from previous day: 09/01 0701 - 09/02 0700 In: 340 [P.O.:240; IV Piggyback:100] Out: 800 [Urine:800] Intake/Output this shift: Total I/O In: -  Out: 1000 [Urine:1000]  Examination:  General exam: AAO at baseline,, NAD, weak appearing. HEENT:Oral mucosa moist, Ear/Nose WNL grossly, dentition normal. Respiratory system: bilaterally clear,no wheezing or crackles,no use of accessory muscle Cardiovascular system: S1 & S2 +, No JVD,. Gastrointestinal system: Abdomen soft, NT,ND, BS+ abdomen is full Nervous System:Alert, awake, moving extremities and grossly nonfocal Extremities: Bilateral lower leg edema +, distal peripheral pulses palpable.  Skin: No rashes,no icterus. MSK: Normal muscle bulk,tone, power  Data Reviewed: I have personally reviewed following labs and imaging studies CBC: Recent Labs  Lab 04/22/20 0400 04/22/20 0400 04/23/20 0417 04/23/20 0417 04/23/20 1842 04/24/20 0355 04/24/20 1750 04/25/20 0419 04/26/20 0357  WBC 12.2*  --  12.7*  --   --  10.9*  --  11.4* 10.5  NEUTROABS 10.0*  --  10.3*  --   --  8.2*  --  8.6* 8.1*  HGB 7.4*   < > 7.0*   < > 7.1* 6.2* 8.9* 8.8* 8.6*  HCT 23.2*   < > 21.0*   < > 22.0* 19.4* 26.9* 26.5* 26.5*  MCV 89.9  --  89.4  --   --  91.1  --  90.1 92.7  PLT 96*  --  89*  --   --  78*  --  90* 100*   < > = values in this interval not displayed.   Basic Metabolic Panel: Recent Labs  Lab 04/20/20 0304 04/21/20 0357 04/22/20 0400 04/22/20 0400 04/23/20 1002  04/23/20 1002 04/23/20 1842 04/24/20 0355 04/24/20 1750 04/25/20 0419 04/25/20 1749 04/26/20 0357  NA 135   < > 136  --  134*  --   --  132*  --  132*  --  132*  K 3.2*   < > 3.8   < > 2.1*   < > 2.5* 3.2* 4.1 3.9  --  3.2*  CL 109   < > 107  --  92*  --   --  95*  --  99  --  98  CO2 19*   < > 20*  --  29  --   --  27  --  27  --  25  GLUCOSE 98   < > 99  --  90  --   --  80  --  80  --  80  BUN 12   < > 11  --  9  --   --  9  --  9  --  8  CREATININE 0.41*   < > 0.78  --  0.64  --   --  0.63  --  0.55  --  0.60  CALCIUM 7.9*   < > 8.2*  --  8.4*  --   --  8.1*  --  8.2*  --  8.1*  MG  --   --   --   --  1.1*  --   --   --   --   --  1.4*  --   PHOS 3.3  --   --   --  3.9  --   --   --   --   --   --   --    < > = values in this interval not displayed.   GFR: Estimated Creatinine Clearance: 110.6 mL/min (by C-G formula based on SCr of 0.6 mg/dL). Liver Function Tests: Recent Labs  Lab 04/22/20 0400 04/23/20 1002 04/24/20 0355 04/25/20 0419 04/26/20 0357  AST 134* 97* 83* 79* 75*  ALT 166* 104* 82* 80* 71*  ALKPHOS 164* 119 100 99 100  BILITOT 24.0* 27.7* 23.6* 25.9* 23.3*  PROT 5.1* 6.2* 5.1* 5.2* 5.0*  ALBUMIN 2.0* 3.8 2.7* 2.5* 2.4*   No results for input(s): LIPASE, AMYLASE in the last 168 hours. No results for input(s): AMMONIA in the last 168 hours. Coagulation Profile: Recent Labs  Lab 04/20/20 0304  INR 1.4*   Cardiac Enzymes: No results for input(s): CKTOTAL, CKMB, CKMBINDEX, TROPONINI in the last 168 hours. BNP (last 3 results) No results for input(s): PROBNP in the last 8760 hours. HbA1C: No results for input(s): HGBA1C in the last 72 hours. CBG: Recent Labs  Lab 04/25/20 2014 04/26/20 0000 04/26/20 0451 04/26/20 0836 04/26/20 1128  GLUCAP 102* 81 82 122* 114*   Lipid Profile: No results for input(s): CHOL, HDL, LDLCALC, TRIG, CHOLHDL, LDLDIRECT in the last 72 hours. Thyroid Function Tests: No results for input(s): TSH, T4TOTAL, FREET4,  T3FREE, THYROIDAB in the last 72 hours. Anemia Panel: No results for input(s): VITAMINB12, FOLATE, FERRITIN, TIBC, IRON, RETICCTPCT in the last 72 hours. Sepsis Labs: No results for input(s): PROCALCITON, LATICACIDVEN in the last 168 hours.  No results found for this or any previous visit (from the past 240 hour(s)).    Radiology Studies: No results found.   LOS: 22 days   Antonieta Pert, MD [facility]  04/26/2020, 1:35 PM

## 2020-04-27 DIAGNOSIS — K7200 Acute and subacute hepatic failure without coma: Principal | ICD-10-CM

## 2020-04-27 LAB — GLUCOSE, CAPILLARY
Glucose-Capillary: 76 mg/dL (ref 70–99)
Glucose-Capillary: 134 mg/dL — ABNORMAL HIGH (ref 70–99)
Glucose-Capillary: 110 mg/dL — ABNORMAL HIGH (ref 70–99)
Glucose-Capillary: 129 mg/dL — ABNORMAL HIGH (ref 70–99)

## 2020-04-27 LAB — COMPREHENSIVE METABOLIC PANEL
ALT: 64 U/L — ABNORMAL HIGH (ref 0–44)
AST: 81 U/L — ABNORMAL HIGH (ref 15–41)
Albumin: 2.3 g/dL — ABNORMAL LOW (ref 3.5–5.0)
Alkaline Phosphatase: 104 U/L (ref 38–126)
Anion gap: 9 (ref 5–15)
BUN: 8 mg/dL (ref 6–20)
CO2: 24 mmol/L (ref 22–32)
Calcium: 8 mg/dL — ABNORMAL LOW (ref 8.9–10.3)
Chloride: 98 mmol/L (ref 98–111)
Creatinine, Ser: 0.66 mg/dL (ref 0.44–1.00)
GFR calc Af Amer: >60 mL/min (ref >60)
GFR calc non Af Amer: >60 mL/min (ref >60)
Glucose, Bld: 93 mg/dL (ref 70–99)
Potassium: 3.6 mmol/L (ref 3.5–5.1)
Sodium: 131 mmol/L — ABNORMAL LOW (ref 135–145)
Total Bilirubin: 21.3 mg/dL (ref 0.3–1.2)
Total Protein: 4.8 g/dL — ABNORMAL LOW (ref 6.5–8.1)

## 2020-04-27 LAB — CBC WITH DIFFERENTIAL/PLATELET
Abs Immature Granulocytes: 0.08 10*3/uL — ABNORMAL HIGH (ref 0.00–0.07)
Basophils Absolute: 0 10*3/uL (ref 0.0–0.1)
Basophils Relative: 0 %
Eosinophils Absolute: 0.1 10*3/uL (ref 0.0–0.5)
Eosinophils Relative: 1 %
HCT: 25.3 % — ABNORMAL LOW (ref 36.0–46.0)
Hemoglobin: 8.3 g/dL — ABNORMAL LOW (ref 12.0–15.0)
Immature Granulocytes: 1 %
Lymphocytes Relative: 15 %
Lymphs Abs: 1.5 10*3/uL (ref 0.7–4.0)
MCH: 30.9 pg (ref 26.0–34.0)
MCHC: 32.8 g/dL (ref 30.0–36.0)
MCV: 94.1 fL (ref 80.0–100.0)
Monocytes Absolute: 1 10*3/uL (ref 0.1–1.0)
Monocytes Relative: 10 %
Neutro Abs: 7.1 10*3/uL (ref 1.7–7.7)
Neutrophils Relative %: 73 %
Platelets: 126 10*3/uL — ABNORMAL LOW (ref 150–400)
RBC: 2.69 MIL/uL — ABNORMAL LOW (ref 3.87–5.11)
WBC: 9.7 10*3/uL (ref 4.0–10.5)
nRBC: 0 % (ref 0.0–0.2)

## 2020-04-27 LAB — MAGNESIUM: Magnesium: 1.5 mg/dL — ABNORMAL LOW (ref 1.7–2.4)

## 2020-04-27 NOTE — Progress Notes (Addendum)
Patient ID: Gina Hooper, female   DOB: Jul 29, 1991, 29 y.o.   MRN: [id number]  GI brief follow up ;  Severe ETOH hepatitis, possible Tylenol toxicity with acute liver failure in this patient with polysubstance abuse. Acute hepatic/metabolic encephalopathy and acute EtOH withdrawal. Anasarca.  Notes reviewed, patient has made significant improvement, and discharge anticipated in the next few days.  Current MELD with last INR at 1.4=23 Current discriminant function score=23  Outpatient follow-up appointment arranged with Willette Cluster, NP on October 13 at 2:30 PM under Dr. Meridee Score.  Continue current medication regimen with lactulose, Aldactone and Lasix. Unlikely to be able to get Xifaxan (d/t expense) as no insurance.

## 2020-04-27 NOTE — Progress Notes (Signed)
Physical Therapy Treatment Patient Details Name: Gina Hooper MRN: [id number] DOB: Nov 27, 1990 Today's Date: 04/27/2020    History of Present Illness Pt admitted with acute encephalopathy and noted to be in acute liver failure in the setting of Alchoholic Hepatitis with profound coagulopathy and hematemesis. ETT x9 days. Pt with hx of ETOH, polysubstance abuse, osteomyolitis of L ankle.  Pt admitted for Acute liver failure/Jaundice in the setting of acute alcohol hepatitis +/- possible Tylenol toxicity/possible SBP    PT Comments    Pt able to ambulate short distance in hallway today.  Pt did not use RW so applied gait belt for safety.   RW still recommended upon d/c as pt requiring UE support at times.  Per chart review, pt and family decline CIR and pt to d/c home with her father.   Follow Up Recommendations  Home health PT;Supervision for mobility/OOB     Equipment Recommendations  Rolling walker with 5" wheels    Recommendations for Other Services       Precautions / Restrictions Precautions Precautions: Fall Restrictions Weight Bearing Restrictions: No    Mobility  Bed Mobility Overal bed mobility: Needs Assistance Bed Mobility: Sit to Supine     Supine to sit: Mod assist     General bed mobility comments: pt requested assist for LEs due to fatigue  Transfers Overall transfer level: Needs assistance Equipment used: None Transfers: Sit to/from Stand Sit to Stand: Min guard         General transfer comment: min/guard for safety  Ambulation/Gait Ambulation/Gait assistance: Min guard Gait Distance (Feet): 40 Feet Assistive device: None Gait Pattern/deviations: Step-through pattern;Decreased stride length;Shuffle Gait velocity: decreased   General Gait Details: slow cautious gait, pt declined using RW and instead utilized hand rail for more support, increased time required   Stairs             Wheelchair Mobility    Modified Rankin (Stroke  Patients Only)       Balance Overall balance assessment: Needs assistance         Standing balance support: No upper extremity supported Standing balance-Leahy Scale: Fair                              Cognition Arousal/Alertness: Awake/alert Behavior During Therapy: WFL for tasks assessed/performed Overall Cognitive Status: Within Functional Limits for tasks assessed                                        Exercises      General Comments        Pertinent Vitals/Pain Pain Assessment: No/denies pain Pain Intervention(s): Repositioned    Home Living                      Prior Function            PT Goals (current goals can now be found in the care plan section) Acute Rehab PT Goals PT Goal Formulation: With patient Time For Goal Achievement: 05/04/20 Potential to Achieve Goals: Good Progress towards PT goals: Progressing toward goals    Frequency    Min 3X/week      PT Plan Current plan remains appropriate;Discharge plan needs to be updated    Co-evaluation              AM-PAC PT "6 Clicks"  Mobility   Outcome Measure  Help needed turning from your back to your side while in a flat bed without using bedrails?: A Little Help needed moving from lying on your back to sitting on the side of a flat bed without using bedrails?: A Little Help needed moving to and from a bed to a chair (including a wheelchair)?: A Little Help needed standing up from a chair using your arms (e.g., wheelchair or bedside chair)?: A Little Help needed to walk in hospital room?: A Little Help needed climbing 3-5 steps with a railing? : A Lot 6 Click Score: 17    End of Session Equipment Utilized During Treatment: Gait belt Activity Tolerance: Patient tolerated treatment well Patient left: in bed;with call bell/phone within reach   PT Visit Diagnosis: Unsteadiness on feet (R26.81);Muscle weakness (generalized) (M62.81);Difficulty in  walking, not elsewhere classified (R26.2)     Time: 3903-0092 PT Time Calculation (min) (ACUTE ONLY): 19 min  Charges:  $Gait Training: 8-22 mins                     Paulino Door, DPT Acute Rehabilitation Services Pager: [phone] Office: [phone]   Sarajane Jews 04/27/2020, 12:53 PM

## 2020-04-27 NOTE — Progress Notes (Addendum)
Magnesium 1.5 in labs, Night hospitalist noted 1930. Pt. Vs WNL, NAD. Will continue to monitor. Consider PO daily? Mag replaced IV 9/1.   Am Mag 1.2, Dr notified, replacement ordered and hung.   Morning glucose 68, boost drink given. Patient aox4, asymptomatic of hypoglycemia.

## 2020-04-27 NOTE — Progress Notes (Signed)
PROGRESS NOTE    Gina Hooper  MWN:[id number] DOB: 29-Sep-1990 DOA: 04/04/2020 PCP: Patient, No Pcp Per   Chief Complaint  Patient presents with  . Emesis  . Hypoglycemia   Brief Narrative: 29 year old female with sudden severe alcohol abuse depression anemia history presented initially 04/04/2020 regarding pain, nausea vomiting, hypoglycemia at home.  In the ED found to have acute liver failure AKI creatinine 1.2 ALP 147 AST more than 10,000 ALT 4566 total bili 4.8 INR 9.9.  Patient was admitted underwent extensive hospitalization further evaluation with GI, treated with MAC, completed 04/07/2020, continued on IV Solu-Medrol, evaluated from a transplant standpoint and deemed not a candidate due to her underlying substance abuse and lack of insurance.  Hospital course complicated by hematemesis on 8/12 due to coagulopathy, felt to be high risk and poor endoscopy candidate and treated with PPI.  Patient developed progressive worsening mentation and became minimally responsive encephalopathic needing intubation 04/06/2020, MRI 8/14 no acute finding, seen by neurology, EEG 8/16 severe diffuse encephalopathy, no seizure, extubated 8/22, transferred to [facility] 8/24.  Pt was placed on Precedex overnight 8/23 due to significant agitation and off  8/24 night. She is waking up more. SLP has placed on diet. PTOT is working with her Patient transfer out of stepdown unit 8/27.  Subjective:  Seen this morning still sleeping.  Reports she has no new complaints.  Voiding well on IV Lasix.  LFTs slowly downtrending.  Wt improving 162> 176.5> 161.1lb. on admission 147 to 154 lb. Urine output 1250 mL, total balance -12 L negative  Assessment & Plan:  Acute liver failure/Jaundice in the setting of acute alcohol hepatitis +/- possible Tylenol toxicity/possible QIH:KVQQ by GI, was discussed with the transplant team while in ICU and not felt to be a candidate for liver transplant.MELD-na 40,discriminant function  304 on admission.  Liver function tests are significantly improved, total bili remains up 21, last INR at 1.4.continue on current supportive measures.patient completed ceftriaxone for possible SBP.starting Aldactone/diuretics. I will request GI to see if they can arrange for outpatient follow-up and if there is any other recommendation for discharge hopefully soon. Recent Labs  Lab 04/23/20 1002 04/24/20 0355 04/25/20 0419 04/26/20 0357 04/27/20 0341  AST 97* 83* 79* 75* 81*  ALT 104* 82* 80* 71* 64*  ALKPHOS 119 100 99 100 104  BILITOT 27.7* 23.6* 25.9* 23.3* 21.3*  PROT 6.2* 5.1* 5.2* 5.0* 4.8*  ALBUMIN 3.8 2.7* 2.5* 2.4* 2.3*   Acute hepatic/metabolic encephalopathy/acute alcohol withdrawal: She is back to baseline mental status but weak and deconditioned.She was seen by seen by neurology, MRI brain no acute finding EEG with diffuse encephalopathy.  She is off restraint and off Precedex 8/24 . Cont Lactulose, rifaximin folic acid multivitamin thiamine.  Moderate ascites/anasarca/bilateral pleural effusion:Patient has gained at least 33 pounds since admission. 147>> 180 with lasix, significant uop and wt 165> 162. Has uop abt 12 l /24 hr after iv lasix- changed to po lasix but wt went up to 176.5 pound-then will put her back on IV Lasix, weight is improving, we will keep on IV Lasix another day or 2 before switching to p.o., keep on Aldactone, continue potassium chloride supplementation .  Hypokalemia: Resolved.  Keep on KCl 20 meq bid.Monitor mag.    Hypomagnesemia Still low at 1.5, add mag ox and iv mag    FEN: Encourage oral intake off IV fluids  Hypernatremia: resolved Hyponatremia sodium low. Monitor  Acute respiratory failure due to liver failure hepatoencephalopathy needing intubation-on vent  for 9 days.  On room air currently.  Encourage incentive spirometry and ambulation.  Coagulopathy secondary to liver failure. monitor INR.  Appears overall stable  Upper GI  bleed/hematemesis in the setting of gastritis/coagulopathy seen by GI poor endoscopy candidate monitor hb.continue PPI. Anemia acute blood loss less/anemia of critical illness: s/p  1 unit PRBC hemoglobin has remained stable.  Monitor hemoglobin and transfuse if less than 7 g.   Recent Labs  Lab 04/24/20 0355 04/24/20 1750 04/25/20 0419 04/26/20 0357 04/27/20 0341  HGB 6.2* 8.9* 8.8* 8.6* 8.3*  HCT 19.4* 26.9* 26.5* 26.5* 25.3*   Thrombocytopenia:in the setting of liver disease and alcohol abuse:Platelet platelets are now uptrending finally.  Recent Labs  Lab 04/23/20 0417 04/24/20 0355 04/25/20 0419 04/26/20 0357 04/27/20 0341  PLT 89* 78* 90* 100* 126*   Moderate protein calorie malnutrition:Continue to augment diet.  CoNS in blood cx 1 bottle:likely contamination.  Leukocytosis it has resolved.  Off antibiotics.   Recent Labs  Lab 04/23/20 0417 04/24/20 0355 04/25/20 0419 04/26/20 0357 04/27/20 0341  WBC 12.7* 10.9* 11.4* 10.5 9.7   Deconditioning:PT/OT consulted.Continue to mobilize her.    Foley + patient has Foley back in place for diuresis.  Was discontinued 8/27 and placed back in 8/28 for fiuresis. D/c foley  9/1.  PICC+: We will see if we can keep her on peripheral IV and remove the PICC line  DVT prophylaxis: SCDs Start: 04/04/20 1659 Code Status:   Code Status: Full Code  Family Communication: Plan of care discussed with patient at bedside. Updated her father 8/29-he reports he is involved in her care and will take her to his home in [place] after discharge and not planning her to return to her grandmother and prefers CIR.Sister at the bedside updated 8/30. No family at bedside.  Status QD:[id number]. Remains inpatient appropriate because:IV treatments appropriate due to intensity of illness or inability to take PO and Inpatient level of care appropriate due to severity of illness Dispo: The patient is from: Home              Anticipated d/c is to: Home  health, difficult to arrange CIR.              Anticipated d/c date is: 2-3 days.              Patient currently is not medically stable to d/c. Nutrition: Diet Order            Diet regular Room service appropriate? Yes with Assist; Fluid consistency: Thin  Diet effective now                 Nutrition Problem: Inadequate oral intake Etiology: inability to eat Signs/Symptoms: NPO status Interventions: Refer to RD note for recommendations Body mass index is 26.01 kg/m.  Consultants:see note  Procedures: UDS 8/11 >> opiate / THC / cocaine positive  CT ABD 8/11 >> borderline hepatomegaly with diffuse patchy low-attenuation with concern for acute inflammation/hepatitis.  Gallbladder wall thickening with pericholecystic fluid felt reactive there were no gallstones.  There is a small right effusion.  Mild ascites in the pelvis. MRI Brain 8/14 >> no acute intracranial process. Mild pansinus disease  EEG 8/16 >> suggestive of severe diffuse encephalopathy, non-specific etiology ECHO 8/19 >> LVEF 65-70%, no RWMA, RV systolic function normal LE Venous Duplex 8/19 >> negative  CT ABD/PELVIS 04/22/20 1.Moderate ascites and anasarca, new since the prior CT. 2. Interval increase in the size of bilateral pleural effusion and associated  atelectasis or infiltrate. 3. Thickened appearance of the wall of the stomach and loops of small bowel, likely related to ascites. Clinical correlation is recommended to evaluate for possibility of gastroenteritis. No bowel obstruction. 4. Uterine fibroid. 5. A 2 cm right ovarian dominant follicle or cyst  Microbiology:see note Blood Culture    Component Value Date/Time   SDES  01/14/2018 0005    BLOOD RIGHT FOREARM Performed at [facility], [address] [place], [place] [address]    [facility]  01/14/2018 0005    BOTTLES DRAWN AEROBIC AND ANAEROBIC Blood Culture adequate volume Performed at [facility], [address] [place], [place] [address]    CULT (A) 01/14/2018 0005    STAPHYLOCOCCUS SPECIES (COAGULASE NEGATIVE) THE SIGNIFICANCE OF ISOLATING THIS ORGANISM FROM A SINGLE SET OF BLOOD CULTURES WHEN MULTIPLE SETS ARE DRAWN IS UNCERTAIN. PLEASE NOTIFY THE MICROBIOLOGY DEPARTMENT WITHIN ONE WEEK IF SPECIATION AND SENSITIVITIES ARE REQUIRED. Performed at [facility], [place] [address] [place], [place] [address]    REPTSTATUS 01/17/2018 FINAL 01/14/2018 0005    Other culture-see note  Medications: Scheduled Meds: . Chlorhexidine Gluconate Cloth  6 each Topical Daily  . feeding supplement  1 Container Oral TID BM  . folic acid  1 mg Oral Daily  . furosemide  20 mg Intravenous Daily  . lactulose  30 g Oral Daily  . mouth rinse  15 mL Mouth Rinse BID  . multivitamin with minerals  1 tablet Oral Daily  . pantoprazole  40 mg Oral Daily  . potassium chloride  20 mEq Oral BID  . rifaximin  550 mg Oral BID  . sodium chloride flush  10-40 mL Intracatheter Q12H  . sodium chloride flush  10-40 mL Intracatheter Q12H  . spironolactone  25 mg Oral Daily  . tamsulosin  0.4 mg Oral QPC supper  . thiamine  100 mg Oral Daily   Or  . thiamine  100 mg Intravenous Daily   Continuous Infusions:   Antimicrobials: Anti-infectives (From admission, onward)   Start     Dose/Rate Route Frequency Ordered Stop   04/18/20 2215  rifaximin (XIFAXAN) tablet 550 mg        550 mg Oral 2 times daily 04/18/20 2208     04/15/20 1000  rifaximin (XIFAXAN) tablet 550 mg  Status:  Discontinued        550 mg Per Tube 2 times daily 04/15/20 0856 04/18/20 2209   04/12/20 1100  rifaximin (XIFAXAN) tablet 550 mg  Status:  Discontinued        550 mg Per Tube 2 times daily 04/12/20 1024 04/15/20 0856   04/12/20 1100  cefTRIAXone (ROCEPHIN) 2 g in sodium chloride 0.9 % 100 mL IVPB  Status:  Discontinued        2 g 200 mL/hr over 30 Minutes Intravenous Every 24 hours 04/12/20 1044 04/22/20 1211       Objective: Vitals: Today's  Vitals   04/27/20 0500 04/27/20 0520 04/27/20 0527 04/27/20 0530  BP:  109/76    Pulse:  97    Resp:  18    Temp:  99.2 F (37.3 C)    TempSrc:  Oral    SpO2:  91%  93%  Weight: 73.1 kg     Height:      PainSc:   7      Intake/Output Summary (Last 24 hours) at 04/27/2020 1128 Last data filed at 04/27/2020 0524 Gross per 24 hour  Intake --  Output 1250 ml  Net -1250 ml   Filed Weights   04/25/20 0236 04/26/20 0302 04/27/20 0500  Weight: 80 kg 79.9 kg 73.1 kg   Weight change: -6.8 kg   Intake/Output from previous day: 09/02 0701 - 09/03 0700 In: -  Out: 1250 [Urine:1250] Intake/Output this shift: No intake/output data recorded.  Examination:  General exam: AAOx3 , NAD, weak appearing. HEENT:Oral mucosa moist, Ear/Nose WNL grossly, dentition normal.  Icterus present Respiratory system: bilaterally clear,no wheezing or crackles,no use of accessory muscle Cardiovascular system: S1 & S2 +, No JVD,. Gastrointestinal system: Abdomen soft, NT,ND, BS+ Nervous System:Alert, awake, moving extremities and grossly nonfocal Extremities: b/l foot edema +, distal peripheral pulses palpable.  Skin: No rashes,no icterus. MSK: Normal muscle bulk,tone, power  Data Reviewed: I have personally reviewed following labs and imaging studies CBC: Recent Labs  Lab 04/23/20 0417 04/23/20 1842 04/24/20 0355 04/24/20 1750 04/25/20 0419 04/26/20 0357 04/27/20 0341  WBC 12.7*  --  10.9*  --  11.4* 10.5 9.7  NEUTROABS 10.3*  --  8.2*  --  8.6* 8.1* 7.1  HGB 7.0*   < > 6.2* 8.9* 8.8* 8.6* 8.3*  HCT 21.0*   < > 19.4* 26.9* 26.5* 26.5* 25.3*  MCV 89.4  --  91.1  --  90.1 92.7 94.1  PLT 89*  --  78*  --  90* 100* 126*   < > = values in this interval not displayed.   Basic Metabolic Panel: Recent Labs  Lab 04/23/20 1002 04/23/20 1842 04/24/20 0355 04/24/20 1750 04/25/20 0419 04/25/20 1749 04/26/20 0357 04/27/20 0341  NA 134*  --  132*  --  132*  --  132* 131*  K 2.1*   < > 3.2* 4.1  3.9  --  3.2* 3.6  CL 92*  --  95*  --  99  --  98 98  CO2 29  --  27  --  27  --  25 24  GLUCOSE 90  --  80  --  80  --  80 93  BUN 9  --  9  --  9  --  8 8  CREATININE 0.64  --  0.63  --  0.55  --  0.60 0.66  CALCIUM 8.4*  --  8.1*  --  8.2*  --  8.1* 8.0*  MG 1.1*  --   --   --   --  1.4*  --  1.5*  PHOS 3.9  --   --   --   --   --   --   --    < > = values in this interval not displayed.   GFR: Estimated Creatinine Clearance: 106.1 mL/min (by C-G formula based on SCr of 0.66 mg/dL). Liver Function Tests: Recent Labs  Lab 04/23/20 1002 04/24/20 0355 04/25/20 0419 04/26/20 0357 04/27/20 0341  AST 97* 83* 79* 75* 81*  ALT 104* 82* 80* 71* 64*  ALKPHOS 119 100 99 100 104  BILITOT 27.7* 23.6* 25.9* 23.3* 21.3*  PROT 6.2* 5.1* 5.2* 5.0* 4.8*  ALBUMIN 3.8 2.7* 2.5* 2.4* 2.3*   No results for input(s): LIPASE, AMYLASE in the last 168 hours. No results for input(s): AMMONIA in the last 168 hours. Coagulation Profile: No results for input(s): INR, PROTIME in the last 168 hours. Cardiac Enzymes: No results for input(s): CKTOTAL, CKMB, CKMBINDEX, TROPONINI in the last 168 hours. BNP (last 3 results) No results for input(s): PROBNP in the last 8760 hours.  HbA1C: No results for input(s): HGBA1C in the last 72 hours. CBG: Recent Labs  Lab 04/26/20 0836 04/26/20 1128 04/26/20 1731 04/26/20 2345 04/27/20 0553  GLUCAP 122* 114* 81 93 76   Lipid Profile: No results for input(s): CHOL, HDL, LDLCALC, TRIG, CHOLHDL, LDLDIRECT in the last 72 hours. Thyroid Function Tests: No results for input(s): TSH, T4TOTAL, FREET4, T3FREE, THYROIDAB in the last 72 hours. Anemia Panel: No results for input(s): VITAMINB12, FOLATE, FERRITIN, TIBC, IRON, RETICCTPCT in the last 72 hours. Sepsis Labs: No results for input(s): PROCALCITON, LATICACIDVEN in the last 168 hours.  No results found for this or any previous visit (from the past 240 hour(s)).    Radiology Studies: No results found.    LOS: 23 days   Antonieta Pert, MD [facility]  04/27/2020, 11:28 AM

## 2020-04-28 LAB — COMPREHENSIVE METABOLIC PANEL
ALT: 60 U/L — ABNORMAL HIGH (ref 0–44)
AST: 78 U/L — ABNORMAL HIGH (ref 15–41)
Albumin: 2.2 g/dL — ABNORMAL LOW (ref 3.5–5.0)
Alkaline Phosphatase: 96 U/L (ref 38–126)
Anion gap: 8 (ref 5–15)
BUN: 6 mg/dL (ref 6–20)
CO2: 24 mmol/L (ref 22–32)
Calcium: 7.8 mg/dL — ABNORMAL LOW (ref 8.9–10.3)
Chloride: 97 mmol/L — ABNORMAL LOW (ref 98–111)
Creatinine, Ser: 0.6 mg/dL (ref 0.44–1.00)
GFR calc Af Amer: >60 mL/min (ref >60)
GFR calc non Af Amer: >60 mL/min (ref >60)
Glucose, Bld: 68 mg/dL — ABNORMAL LOW (ref 70–99)
Potassium: 3.2 mmol/L — ABNORMAL LOW (ref 3.5–5.1)
Sodium: 129 mmol/L — ABNORMAL LOW (ref 135–145)
Total Bilirubin: 19.9 mg/dL (ref 0.3–1.2)
Total Protein: 4.8 g/dL — ABNORMAL LOW (ref 6.5–8.1)

## 2020-04-28 LAB — GLUCOSE, CAPILLARY
Glucose-Capillary: 78 mg/dL (ref 70–99)
Glucose-Capillary: 107 mg/dL — ABNORMAL HIGH (ref 70–99)
Glucose-Capillary: 107 mg/dL — ABNORMAL HIGH (ref 70–99)
Glucose-Capillary: 95 mg/dL (ref 70–99)

## 2020-04-28 LAB — CBC WITH DIFFERENTIAL/PLATELET
Abs Immature Granulocytes: 0.1 10*3/uL — ABNORMAL HIGH (ref 0.00–0.07)
Basophils Absolute: 0 10*3/uL (ref 0.0–0.1)
Basophils Relative: 0 %
Eosinophils Absolute: 0.1 10*3/uL (ref 0.0–0.5)
Eosinophils Relative: 1 %
HCT: 25.8 % — ABNORMAL LOW (ref 36.0–46.0)
Hemoglobin: 8.3 g/dL — ABNORMAL LOW (ref 12.0–15.0)
Immature Granulocytes: 1 %
Lymphocytes Relative: 17 %
Lymphs Abs: 1.7 10*3/uL (ref 0.7–4.0)
MCH: 30.5 pg (ref 26.0–34.0)
MCHC: 32.2 g/dL (ref 30.0–36.0)
MCV: 94.9 fL (ref 80.0–100.0)
Monocytes Absolute: 0.9 10*3/uL (ref 0.1–1.0)
Monocytes Relative: 9 %
Neutro Abs: 7 10*3/uL (ref 1.7–7.7)
Neutrophils Relative %: 72 %
Platelets: 132 10*3/uL — ABNORMAL LOW (ref 150–400)
RBC: 2.72 MIL/uL — ABNORMAL LOW (ref 3.87–5.11)
WBC: 9.8 10*3/uL (ref 4.0–10.5)
nRBC: 0 % (ref 0.0–0.2)

## 2020-04-28 LAB — MAGNESIUM: Magnesium: 1.2 mg/dL — ABNORMAL LOW (ref 1.7–2.4)

## 2020-04-28 MED ORDER — MAGNESIUM SULFATE 2 GM/50ML IV SOLN
2.0000 g | Freq: Once | INTRAVENOUS | Status: AC
  Administered 2020-04-28: 2 g via INTRAVENOUS
  Filled 2020-04-28: qty 50

## 2020-04-28 MED ORDER — POTASSIUM CHLORIDE 20 MEQ PO PACK
40.0000 meq | PACK | Freq: Once | ORAL | Status: AC
  Administered 2020-04-28: 40 meq via ORAL
  Filled 2020-04-28: qty 2

## 2020-04-28 NOTE — Progress Notes (Signed)
PROGRESS NOTE    Gina Hooper  XKG:[id number] DOB: 1991-03-21 DOA: 04/04/2020 PCP: Patient, No Pcp Per    Brief Narrative:  29 year old female with severe alcohol abuse, depression, anemia history presented initially 04/04/2020 regarding pain, nausea, vomiting, hypoglycemia at home. In the ED found to have acute liver failure, AKI creatinine 1.2, ALP 147 AST more than 10,000 ALT 4566 total bili 4.8 INR 9.9.  Patient was admitted, underwent extensive hospitalization further evaluation with GI, treated with MAC, completed 04/07/2020, continued on IV Solu-Medrol, evaluated from a transplant standpoint and deemed not a candidate due to her underlying substance abuse and lack of insurance.  Hospital course complicated by hematemesis on 8/12 due to coagulopathy, felt to be high risk and poor endoscopy candidate and treated with PPI.  Patient developed progressive worsening mentation and became minimally responsive, encephalopathic needing intubation 04/06/2020, MRI 8/14 no acute finding, seen by neurology, EEG 8/16 severe diffuse encephalopathy, no seizure, extubated 8/22, transferred to [facility] 8/24.  Pt was placed on Precedex overnight 8/23 due to significant agitation and off  8/24 night. She is waking up more. SLP has placed on diet. PTOT is working with her Patient transfer out of stepdown unit 8/27.   Assessment & Plan:   Active Problems:   Hepatitis   Acute liver failure   Alcohol abuse   Thrombocytopenia (HCC)   Coagulopathy (HCC)   SIRS (systemic inflammatory response syndrome) (HCC)   Acute respiratory failure with hypoxia (HCC)   Polysubstance abuse (HCC)  Acute liver failure /Jaundice in the setting of acute alcohol hepatitis +/- possible Tylenol toxicity/possible SBP: Seen by GI, was discussed with the transplant team while in ICU and not felt to be a candidate for liver transplant. MELD-na 40, discriminant function 304 on admission.  Liver function tests are significantly improved,  total bili remains up 19.9 , last INR at 1.4. Continue on current supportive measures. Patient completed ceftriaxone for possible SBP. starting Aldactone/diuretics.  GI recommended continue current medication regimen with lactulose Aldactone and Lasix.  Outpatient follow-up arranged with Windy Fast on October 13.   Acute hepatic/metabolic encephalopathy/acute alcohol withdrawal: She is back to baseline mental status but weak and deconditioned.She was seen by seen by neurology, MRI brain no acute finding,  EEG with diffuse encephalopathy.  She is off restraint and off Precedex 8/24 . Cont Lactulose,folic acid multivitamin thiamine.  Moderate ascites/anasarca/bilateral pleural effusion:Patient has gained at least 33 pounds since admission. 147>> 180 with lasix, significant uop and wt 165> 162. Has uop abt 12 l /24 hr after iv lasix- changed to po lasix but wt went up to 176.5 pound-then will put her back on IV Lasix, weight is improving, we will keep on IV Lasix another day or 2 before switching to p.o., keep on Aldactone, continue potassium chloride supplementation .  Hypokalemia:.  Keep on KCl 20 meq bid. Monitor mag.    Hypomagnesemia Still low at 1.5, add mag ox and iv mag    FEN: Encourage oral intake off IV fluids  Hypernatremia: resolved.  Hyponatremia sodium low. Monitor  Acute respiratory failure due to liver failure hepatoencephalopathy needing intubation-on vent for 9 days.  On room air currently.  Encourage incentive spirometry and ambulation.  Coagulopathy secondary to liver failure. monitor INR. Appears overall stable  Upper GI bleed/hematemesis in the setting of gastritis/coagulopathy seen by GI poor endoscopy candidate monitor  Hb, remains stable @ 8.3. Continue PPI.  Anemia acute blood loss less/anemia of critical illness: s/p  1 unit PRBC hemoglobin  has remained stable.  Monitor hemoglobin and transfuse if less than 7 gms.  Hb remains stable @ 8.3  Thrombocytopenia: in the  setting of liver disease and alcohol abuse: Platelet platelets are now uptrending finally.   Moderate protein calorie malnutrition:Continue to augment diet.  CoNS in blood cx 1 bottle:likely contamination.  Leukocytosis it has resolved.  Off antibiotics.    Deconditioning:PT/OT consulted.Continue to mobilize her.    Foley + patient has Foley back in place for diuresis.  Was discontinued 8/27 and placed back in 8/28 for fiuresis. D/c foley  9/1.  PICC+: We will see if we can keep her on peripheral IV and remove the PICC line   DVT prophylaxis: SCDs. Code Status: Full Family Communication: d/w Patient in detail. Disposition Plan:   Dispo: The patient is from: Home  Anticipated d/c is to: Home health, difficult to arrange CIR.  Anticipated d/c date is: 2-3 days.  Patient currently is not medically stable to d/c.    Consultants:   GI  Procedures: Antimicrobials:  Anti-infectives (From admission, onward)   Start     Dose/Rate Route Frequency Ordered Stop   04/18/20 2215  rifaximin (XIFAXAN) tablet 550 mg        550 mg Oral 2 times daily 04/18/20 2208     04/15/20 1000  rifaximin (XIFAXAN) tablet 550 mg  Status:  Discontinued        550 mg Per Tube 2 times daily 04/15/20 0856 04/18/20 2209   04/12/20 1100  rifaximin (XIFAXAN) tablet 550 mg  Status:  Discontinued        550 mg Per Tube 2 times daily 04/12/20 1024 04/15/20 0856   04/12/20 1100  cefTRIAXone (ROCEPHIN) 2 g in sodium chloride 0.9 % 100 mL IVPB  Status:  Discontinued        2 g 200 mL/hr over 30 Minutes Intravenous Every 24 hours 04/12/20 1044 04/22/20 1211      Subjective: Patient was seen and examined at bedside overnight events noted.  Patient denies any nausea, vomiting, diarrhea.  she reports having abdominal pain but overall she feels better.  Objective: Vitals:   04/27/20 1228 04/27/20 2255 04/28/20 0641 04/28/20 1455  BP: 116/82 126/88 111/77 121/86  Pulse: 100  (!) 105 99 97  Resp: _0 Temp: 98.4 F (36.9 C) 99.2 F (37.3 C) 99.1 F (37.3 C) 98.6 F (37 C)  TempSrc: Oral Oral Oral Oral  SpO2: 100% 97% 93% 97%  Weight:      Height:        Intake/Output Summary (Last 24 hours) at 04/28/2020 1631 Last data filed at 04/28/2020 0800 Gross per 24 hour  Intake --  Output 450 ml  Net -450 ml   Filed Weights   04/25/20 0236 04/26/20 0302 04/27/20 0500  Weight: 80 kg 79.9 kg 73.1 kg    Examination:  General exam: Appears calm and comfortable  Respiratory system: Clear to auscultation. Respiratory effort normal. Cardiovascular system: S1 & S2 heard, RRR. No JVD, murmurs, rubs, gallops or clicks. No pedal edema. Gastrointestinal system: Abdomen is nondistended, soft and nontender. No organomegaly or masses felt. Normal bowel sounds heard. Central nervous system: Alert and oriented. No focal neurological deficits. Extremities:  No leg edema, peripheral pulses palpable. Skin: No rashes, lesions or ulcers Psychiatry: Judgement and insight appear normal. Mood & affect appropriate.     Data Reviewed: I have personally reviewed following labs and imaging studies  CBC: Recent Labs  Lab 04/24/20 0355  04/24/20 0355 04/24/20 1750 04/25/20 0419 04/26/20 0357 04/27/20 0341 04/28/20 0350  WBC 10.9*  --   --  11.4* 10.5 9.7 9.8  NEUTROABS 8.2*  --   --  8.6* 8.1* 7.1 7.0  HGB 6.2*   < > 8.9* 8.8* 8.6* 8.3* 8.3*  HCT 19.4*   < > 26.9* 26.5* 26.5* 25.3* 25.8*  MCV 91.1  --   --  90.1 92.7 94.1 94.9  PLT 78*  --   --  90* 100* 126* 132*   < > = values in this interval not displayed.   Basic Metabolic Panel: Recent Labs  Lab 04/23/20 1002 04/23/20 1842 04/24/20 0355 04/24/20 0355 04/24/20 1750 04/25/20 0419 04/25/20 1749 04/26/20 0357 04/27/20 0341 04/28/20 0350  NA 134*  --  132*  --   --  132*  --  132* 131* 129*  K 2.1*   < > 3.2*   < > 4.1 3.9  --  3.2* 3.6 3.2*  CL 92*  --  95*  --   --  99  --  98 98 97*  CO2 29  --   27  --   --  27  --  _0 GLUCOSE 90  --  80  --   --  80  --  80 93 68*  BUN 9  --  9  --   --  9  --  _1 CREATININE 0.64  --  0.63  --   --  0.55  --  0.60 0.66 0.60  CALCIUM 8.4*  --  8.1*  --   --  8.2*  --  8.1* 8.0* 7.8*  MG 1.1*  --   --   --   --   --  1.4*  --  1.5* 1.2*  PHOS 3.9  --   --   --   --   --   --   --   --   --    < > = values in this interval not displayed.   GFR: Estimated Creatinine Clearance: 106.1 mL/min (by C-G formula based on SCr of 0.6 mg/dL). Liver Function Tests: Recent Labs  Lab 04/24/20 0355 04/25/20 0419 04/26/20 0357 04/27/20 0341 04/28/20 0350  AST 83* 79* 75* 81* 78*  ALT 82* 80* 71* 64* 60*  ALKPHOS 100 99 100 104 96  BILITOT 23.6* 25.9* 23.3* 21.3* 19.9*  PROT 5.1* 5.2* 5.0* 4.8* 4.8*  ALBUMIN 2.7* 2.5* 2.4* 2.3* 2.2*   No results for input(s): LIPASE, AMYLASE in the last 168 hours. No results for input(s): AMMONIA in the last 168 hours. Coagulation Profile: No results for input(s): INR, PROTIME in the last 168 hours. Cardiac Enzymes: No results for input(s): CKTOTAL, CKMB, CKMBINDEX, TROPONINI in the last 168 hours. BNP (last 3 results) No results for input(s): PROBNP in the last 8760 hours. HbA1C: No results for input(s): HGBA1C in the last 72 hours. CBG: Recent Labs  Lab 04/27/20 1225 04/27/20 1752 04/27/20 2250 04/28/20 0913 04/28/20 1222  GLUCAP 134* 110* 129* 78 107*   Lipid Profile: No results for input(s): CHOL, HDL, LDLCALC, TRIG, CHOLHDL, LDLDIRECT in the last 72 hours. Thyroid Function Tests: No results for input(s): TSH, T4TOTAL, FREET4, T3FREE, THYROIDAB in the last 72 hours. Anemia Panel: No results for input(s): VITAMINB12, FOLATE, FERRITIN, TIBC, IRON, RETICCTPCT in the last 72 hours. Sepsis Labs: No results for input(s): PROCALCITON, LATICACIDVEN in the last 168 hours.  No results found for this  or any previous visit (from the past 240 hour(s)).    Radiology Studies: No results  found.  Scheduled Meds: . Chlorhexidine Gluconate Cloth  6 each Topical Daily  . feeding supplement  1 Container Oral TID BM  . folic acid  1 mg Oral Daily  . furosemide  20 mg Intravenous Daily  . lactulose  30 g Oral Daily  . mouth rinse  15 mL Mouth Rinse BID  . multivitamin with minerals  1 tablet Oral Daily  . pantoprazole  40 mg Oral Daily  . potassium chloride  20 mEq Oral BID  . rifaximin  550 mg Oral BID  . sodium chloride flush  10-40 mL Intracatheter Q12H  . sodium chloride flush  10-40 mL Intracatheter Q12H  . spironolactone  25 mg Oral Daily  . tamsulosin  0.4 mg Oral QPC supper  . thiamine  100 mg Oral Daily   Or  . thiamine  100 mg Intravenous Daily   Continuous Infusions:   LOS: 24 days    Time spent: 25 mins.    Shawna Clamp, MD [facility]   If 7PM-7AM, please contact night-coverage

## 2020-04-28 NOTE — TOC Progression Note (Signed)
Transition of Care [facility]) - Progression Note    Patient Details  Name: Gina Hooper MRN: [id number] Date of Birth: 09-03-1990  Transition of Care [facility]) CM/SW Contact  Darleene Cleaver, [place] Phone Number: 04/28/2020, 3:52 PM  Clinical Narrative:     Patient will be going home with her father once she is medically ready.  Patient and family do not want CIR, and CIR has signed off due to patient not needing the same level of therapy.  Expected Discharge Plan: Home w Home Health Services Barriers to Discharge: Continued Medical Work up  Expected Discharge Plan and Services Expected Discharge Plan: Home w Home Health Services In-house Referral: Financial Counselor Discharge Planning Services: CM Consult   Living arrangements for the past 2 months: Single Family Home                                       Social Determinants of Health (SDOH) Interventions    Readmission Risk Interventions No flowsheet data found.

## 2020-04-29 LAB — PHOSPHORUS: Phosphorus: 3.8 mg/dL (ref 2.5–4.6)

## 2020-04-29 LAB — COMPREHENSIVE METABOLIC PANEL
ALT: 63 U/L — ABNORMAL HIGH (ref 0–44)
AST: 85 U/L — ABNORMAL HIGH (ref 15–41)
Albumin: 2.5 g/dL — ABNORMAL LOW (ref 3.5–5.0)
Alkaline Phosphatase: 117 U/L (ref 38–126)
Anion gap: 10 (ref 5–15)
BUN: 5 mg/dL — ABNORMAL LOW (ref 6–20)
CO2: 25 mmol/L (ref 22–32)
Calcium: 8.4 mg/dL — ABNORMAL LOW (ref 8.9–10.3)
Chloride: 98 mmol/L (ref 98–111)
Creatinine, Ser: 0.79 mg/dL (ref 0.44–1.00)
GFR calc Af Amer: >60 mL/min (ref >60)
GFR calc non Af Amer: >60 mL/min (ref >60)
Glucose, Bld: 99 mg/dL (ref 70–99)
Potassium: 3.1 mmol/L — ABNORMAL LOW (ref 3.5–5.1)
Sodium: 133 mmol/L — ABNORMAL LOW (ref 135–145)
Total Bilirubin: 20.6 mg/dL (ref 0.3–1.2)
Total Protein: 5.6 g/dL — ABNORMAL LOW (ref 6.5–8.1)

## 2020-04-29 LAB — CBC WITH DIFFERENTIAL/PLATELET
Abs Immature Granulocytes: 0.15 10*3/uL — ABNORMAL HIGH (ref 0.00–0.07)
Basophils Absolute: 0 10*3/uL (ref 0.0–0.1)
Basophils Relative: 0 %
Eosinophils Absolute: 0.1 10*3/uL (ref 0.0–0.5)
Eosinophils Relative: 1 %
HCT: 29.6 % — ABNORMAL LOW (ref 36.0–46.0)
Hemoglobin: 9.4 g/dL — ABNORMAL LOW (ref 12.0–15.0)
Immature Granulocytes: 2 %
Lymphocytes Relative: 20 %
Lymphs Abs: 2 10*3/uL (ref 0.7–4.0)
MCH: 30.6 pg (ref 26.0–34.0)
MCHC: 31.8 g/dL (ref 30.0–36.0)
MCV: 96.4 fL (ref 80.0–100.0)
Monocytes Absolute: 0.9 10*3/uL (ref 0.1–1.0)
Monocytes Relative: 9 %
Neutro Abs: 7 10*3/uL (ref 1.7–7.7)
Neutrophils Relative %: 68 %
Platelets: 189 10*3/uL (ref 150–400)
RBC: 3.07 MIL/uL — ABNORMAL LOW (ref 3.87–5.11)
WBC: 10.2 10*3/uL (ref 4.0–10.5)
nRBC: 0 % (ref 0.0–0.2)

## 2020-04-29 LAB — GLUCOSE, CAPILLARY
Glucose-Capillary: 71 mg/dL (ref 70–99)
Glucose-Capillary: 93 mg/dL (ref 70–99)
Glucose-Capillary: 103 mg/dL — ABNORMAL HIGH (ref 70–99)
Glucose-Capillary: 106 mg/dL — ABNORMAL HIGH (ref 70–99)

## 2020-04-29 LAB — MAGNESIUM: Magnesium: 1.6 mg/dL — ABNORMAL LOW (ref 1.7–2.4)

## 2020-04-29 MED ORDER — POTASSIUM CHLORIDE 20 MEQ PO PACK
40.0000 meq | PACK | Freq: Once | ORAL | Status: AC
  Administered 2020-04-29: 40 meq via ORAL
  Filled 2020-04-29: qty 2

## 2020-04-29 MED ORDER — MAGNESIUM SULFATE 2 GM/50ML IV SOLN
2.0000 g | Freq: Once | INTRAVENOUS | Status: AC
  Administered 2020-04-29: 2 g via INTRAVENOUS
  Filled 2020-04-29: qty 50

## 2020-04-29 NOTE — Progress Notes (Signed)
PROGRESS NOTE    Gina Hooper  CZY:[id number] DOB: Feb 10, 1991 DOA: 04/04/2020 PCP: Patient, No Pcp Per    Brief Narrative:  29 year old female with severe alcohol abuse, depression, anemia history presented initially 04/04/2020 regarding pain, nausea, vomiting, hypoglycemia at home. In the ED found to have acute liver failure, AKI creatinine 1.2, ALP 147 AST more than 10,000 ALT 4566 total bili 4.8 INR 9.9.  Patient was admitted, underwent extensive hospitalization further evaluation with GI, treated with MAC, completed 04/07/2020, continued on IV Solu-Medrol, evaluated from a transplant standpoint and deemed not a candidate due to her underlying substance abuse and lack of insurance.  Hospital course complicated by hematemesis on 8/12 due to coagulopathy, felt to be high risk and poor endoscopy candidate and treated with PPI.  Patient developed progressive worsening mentation and became minimally responsive, encephalopathic needing intubation 04/06/2020, MRI 8/14 no acute finding, seen by neurology, EEG 8/16 severe diffuse encephalopathy, no seizure, extubated 8/22, transferred to [facility] 8/24.  Pt was placed on Precedex overnight 8/23 due to significant agitation and off  8/24 night. She is waking up more. SLP has placed on diet. PTOT is working with her Patient transfer out of stepdown unit 8/27. She is doing well so far. Labs improving but still feels weak and deconditioned.   Assessment & Plan:   Active Problems:   Hepatitis   Acute liver failure   Alcohol abuse   Thrombocytopenia (HCC)   Coagulopathy (HCC)   SIRS (systemic inflammatory response syndrome) (HCC)   Acute respiratory failure with hypoxia (HCC)   Polysubstance abuse (HCC)  Acute liver failure /Jaundice in the setting of acute alcohol hepatitis +/- possible Tylenol toxicity/possible SBP: Seen by GI, was discussed with the transplant team while in ICU and not felt to be a candidate for liver transplant. MELD-na 40,  discriminant function 304 on admission.  Liver function tests are significantly improved, total bili remains up 20.6 , last INR at 1.4. Continue on current supportive measures. Patient completed ceftriaxone for possible SBP. Started on Aldactone/diuretics.  GI recommended continue current medication regimen with lactulose, Aldactone and Lasix.  Outpatient follow-up arranged with Windy Fast on October 13.   Acute hepatic/metabolic encephalopathy/acute alcohol withdrawal: She is back to baseline mental status but weak and deconditioned. She was seen  by neurology, MRI brain no acute finding,  EEG with diffuse encephalopathy.  She is off restraint and off Precedex 8/24 . Cont Lactulose, folic acid multivitamin thiamine.  Moderate ascites/anasarca/bilateral pleural effusion:Patient has gained at least 33 pounds since admission. 147>> 180 with lasix, significant uop and wt 165> 162. Has uop abt 12 l /24 hr after iv lasix- changed to po lasix but wt went up to 176.5 pound-then will put her back on IV Lasix, weight is improving, we will keep on IV Lasix another day or 2 before switching to p.o., Keep on Aldactone, continue potassium chloride supplementation .  Hypokalemia:.  Keep on KCl 20 meq bid. Monitor mag.    Hypomagnesemia Still low at 1.6, add mag ox and iv mag    FEN: Encourage oral intake off IV fluids  Hypernatremia: resolved.  Hyponatremia sodium low. Monitor  Acute respiratory failure due to liver failure hepatoencephalopathy needing intubation-on vent for 9 days.  On room air currently.  Encourage incentive spirometry and ambulation.  Coagulopathy secondary to liver failure. monitor INR. Appears overall stable  Upper GI bleed/hematemesis in the setting of gastritis/coagulopathy seen by GI,  Poor endoscopy candidate,  monitor  Hb, remains stable @  8.3. Continue PPI.  Anemia acute blood loss less/anemia of critical illness: s/p  1 unit PRBC hemoglobin has remained stable.  Monitor  hemoglobin and transfuse if less than 7 gms.  Hb remains stable @ 9.4 today.  Thrombocytopenia: in the setting of liver disease and alcohol abuse: Platelet platelets are now uptrending finally.   Moderate protein calorie malnutrition:Continue to augment diet.  CoNS in blood cx 1 bottle:likely contamination.  Leukocytosis it has resolved.  Off antibiotics.    Deconditioning:PT/OT consulted.Continue to mobilize her.    Foley + patient has Foley back in place for diuresis.  Was discontinued 8/27 and placed back in 8/28 for diuresis. D/c foley  9/1.  PICC+: We will see if we can keep her on peripheral IV and remove the PICC line.   DVT prophylaxis: SCDs. Code Status: Full Family Communication: d/w Patient in detail. Disposition Plan:   Dispo: The patient is from: Home  Anticipated d/c is to: Home health, difficult to arrange CIR.  Anticipated d/c date is: 2-3 days.  Patient currently is not medically stable to d/c.    Consultants:   GI  Procedures: Antimicrobials:  Anti-infectives (From admission, onward)   Start     Dose/Rate Route Frequency Ordered Stop   04/18/20 2215  rifaximin (XIFAXAN) tablet 550 mg        550 mg Oral 2 times daily 04/18/20 2208     04/15/20 1000  rifaximin (XIFAXAN) tablet 550 mg  Status:  Discontinued        550 mg Per Tube 2 times daily 04/15/20 0856 04/18/20 2209   04/12/20 1100  rifaximin (XIFAXAN) tablet 550 mg  Status:  Discontinued        550 mg Per Tube 2 times daily 04/12/20 1024 04/15/20 0856   04/12/20 1100  cefTRIAXone (ROCEPHIN) 2 g in sodium chloride 0.9 % 100 mL IVPB  Status:  Discontinued        2 g 200 mL/hr over 30 Minutes Intravenous Every 24 hours 04/12/20 1044 04/22/20 1211      Subjective: Patient was seen and examined at bedside,  overnight events noted.   Patient complains of having abdominal pain, nausea and feeling weak.  She reports feeling like dehydrated. Objective: Vitals:    04/28/20 2107 04/29/20 0504 04/29/20 0956 04/29/20 1318  BP: 119/90 109/81 113/82 110/76  Pulse: 88 93  89  Resp: _0 Temp: 100.2 F (37.9 C) 99.5 F (37.5 C)  98.6 F (37 C)  TempSrc: Oral Oral  Oral  SpO2: 95% 92%  94%  Weight:  69.7 kg    Height:        Intake/Output Summary (Last 24 hours) at 04/29/2020 1503 Last data filed at 04/29/2020 1400 Gross per 24 hour  Intake 170 ml  Output 250 ml  Net -80 ml   Filed Weights   04/26/20 0302 04/27/20 0500 04/29/20 0504  Weight: 79.9 kg 73.1 kg 69.7 kg    Examination:  General exam: Appears calm and comfortable  Respiratory system: Clear to auscultation. Respiratory effort normal. Cardiovascular system: S1 & S2 heard, RRR. No JVD, murmurs, rubs, gallops or clicks. No pedal edema. Gastrointestinal system: Abdomen is nondistended, soft and nontender. No organomegaly or masses felt. Normal bowel sounds heard. Central nervous system: Alert and oriented. No focal neurological deficits. Extremities:  No leg edema, peripheral pulses palpable. Skin: No rashes, lesions or ulcers Psychiatry: Judgement and insight appear normal. Mood & affect appropriate.     Data Reviewed:  I have personally reviewed following labs and imaging studies  CBC: Recent Labs  Lab 04/25/20 0419 04/26/20 0357 04/27/20 0341 04/28/20 0350 04/29/20 0340  WBC 11.4* 10.5 9.7 9.8 10.2  NEUTROABS 8.6* 8.1* 7.1 7.0 7.0  HGB 8.8* 8.6* 8.3* 8.3* 9.4*  HCT 26.5* 26.5* 25.3* 25.8* 29.6*  MCV 90.1 92.7 94.1 94.9 96.4  PLT 90* 100* 126* 132* 625   Basic Metabolic Panel: Recent Labs  Lab 04/23/20 1002 04/23/20 1842 04/25/20 0419 04/25/20 1749 04/26/20 0357 04/27/20 0341 04/28/20 0350 04/29/20 0340  NA 134*   < > 132*  --  132* 131* 129* 133*  K 2.1*   < > 3.9  --  3.2* 3.6 3.2* 3.1*  CL 92*   < > 99  --  98 98 97* 98  CO2 29   < > 27  --  _0 GLUCOSE 90   < > 80  --  80 93 68* 99  BUN 9   < > 9  --  _1 5*  CREATININE 0.64   < > 0.55   --  0.60 0.66 0.60 0.79  CALCIUM 8.4*   < > 8.2*  --  8.1* 8.0* 7.8* 8.4*  MG 1.1*  --   --  1.4*  --  1.5* 1.2* 1.6*  PHOS 3.9  --   --   --   --   --   --  3.8   < > = values in this interval not displayed.   GFR: Estimated Creatinine Clearance: 97.1 mL/min (by C-G formula based on SCr of 0.79 mg/dL). Liver Function Tests: Recent Labs  Lab 04/25/20 0419 04/26/20 0357 04/27/20 0341 04/28/20 0350 04/29/20 0340  AST 79* 75* 81* 78* 85*  ALT 80* 71* 64* 60* 63*  ALKPHOS 99 100 104 96 117  BILITOT 25.9* 23.3* 21.3* 19.9* 20.6*  PROT 5.2* 5.0* 4.8* 4.8* 5.6*  ALBUMIN 2.5* 2.4* 2.3* 2.2* 2.5*   No results for input(s): LIPASE, AMYLASE in the last 168 hours. No results for input(s): AMMONIA in the last 168 hours. Coagulation Profile: No results for input(s): INR, PROTIME in the last 168 hours. Cardiac Enzymes: No results for input(s): CKTOTAL, CKMB, CKMBINDEX, TROPONINI in the last 168 hours. BNP (last 3 results) No results for input(s): PROBNP in the last 8760 hours. HbA1C: No results for input(s): HGBA1C in the last 72 hours. CBG: Recent Labs  Lab 04/28/20 1720 04/28/20 2104 04/29/20 0305 04/29/20 0739 04/29/20 1150  GLUCAP 107* 95 71 93 103*   Lipid Profile: No results for input(s): CHOL, HDL, LDLCALC, TRIG, CHOLHDL, LDLDIRECT in the last 72 hours. Thyroid Function Tests: No results for input(s): TSH, T4TOTAL, FREET4, T3FREE, THYROIDAB in the last 72 hours. Anemia Panel: No results for input(s): VITAMINB12, FOLATE, FERRITIN, TIBC, IRON, RETICCTPCT in the last 72 hours. Sepsis Labs: No results for input(s): PROCALCITON, LATICACIDVEN in the last 168 hours.  No results found for this or any previous visit (from the past 240 hour(s)).    Radiology Studies: No results found.  Scheduled Meds:  Chlorhexidine Gluconate Cloth  6 each Topical Daily   feeding supplement  1 Container Oral TID BM   folic acid  1 mg Oral Daily   furosemide  20 mg Intravenous Daily    lactulose  30 g Oral Daily   mouth rinse  15 mL Mouth Rinse BID   multivitamin with minerals  1 tablet Oral Daily   pantoprazole  40 mg Oral  Daily   potassium chloride  20 mEq Oral BID   rifaximin  550 mg Oral BID   sodium chloride flush  10-40 mL Intracatheter Q12H   sodium chloride flush  10-40 mL Intracatheter Q12H   spironolactone  25 mg Oral Daily   tamsulosin  0.4 mg Oral QPC supper   thiamine  100 mg Oral Daily   Or   thiamine  100 mg Intravenous Daily   Continuous Infusions:   LOS: 25 days    Time spent: 25 mins.    Shawna Clamp, MD [facility]   If 7PM-7AM, please contact night-coverage

## 2020-04-30 DIAGNOSIS — J9601 Acute respiratory failure with hypoxia: Secondary | ICD-10-CM

## 2020-04-30 DIAGNOSIS — D62 Acute posthemorrhagic anemia: Secondary | ICD-10-CM

## 2020-04-30 DIAGNOSIS — E871 Hypo-osmolality and hyponatremia: Secondary | ICD-10-CM

## 2020-04-30 DIAGNOSIS — D689 Coagulation defect, unspecified: Secondary | ICD-10-CM

## 2020-04-30 DIAGNOSIS — D696 Thrombocytopenia, unspecified: Secondary | ICD-10-CM

## 2020-04-30 DIAGNOSIS — F1010 Alcohol abuse, uncomplicated: Secondary | ICD-10-CM

## 2020-04-30 DIAGNOSIS — E876 Hypokalemia: Secondary | ICD-10-CM

## 2020-04-30 LAB — CBC WITH DIFFERENTIAL/PLATELET
Abs Immature Granulocytes: 0.18 10*3/uL — ABNORMAL HIGH (ref 0.00–0.07)
Basophils Absolute: 0 10*3/uL (ref 0.0–0.1)
Basophils Relative: 0 %
Eosinophils Absolute: 0.1 10*3/uL (ref 0.0–0.5)
Eosinophils Relative: 1 %
HCT: 32.7 % — ABNORMAL LOW (ref 36.0–46.0)
Hemoglobin: 10.4 g/dL — ABNORMAL LOW (ref 12.0–15.0)
Immature Granulocytes: 2 %
Lymphocytes Relative: 23 %
Lymphs Abs: 2.4 10*3/uL (ref 0.7–4.0)
MCH: 31 pg (ref 26.0–34.0)
MCHC: 31.8 g/dL (ref 30.0–36.0)
MCV: 97.6 fL (ref 80.0–100.0)
Monocytes Absolute: 0.8 10*3/uL (ref 0.1–1.0)
Monocytes Relative: 8 %
Neutro Abs: 6.8 10*3/uL (ref 1.7–7.7)
Neutrophils Relative %: 66 %
Platelets: 265 10*3/uL (ref 150–400)
RBC: 3.35 MIL/uL — ABNORMAL LOW (ref 3.87–5.11)
RDW: 26 % — ABNORMAL HIGH (ref 11.5–15.5)
WBC: 10.3 10*3/uL (ref 4.0–10.5)
nRBC: 0 % (ref 0.0–0.2)

## 2020-04-30 LAB — HEPATIC FUNCTION PANEL
ALT: 54 U/L — ABNORMAL HIGH (ref 0–44)
AST: 76 U/L — ABNORMAL HIGH (ref 15–41)
Albumin: 2.2 g/dL — ABNORMAL LOW (ref 3.5–5.0)
Alkaline Phosphatase: 102 U/L (ref 38–126)
Bilirubin, Direct: 8 mg/dL — ABNORMAL HIGH (ref 0.0–0.2)
Indirect Bilirubin: 7.4 mg/dL — ABNORMAL HIGH (ref 0.3–0.9)
Total Bilirubin: 15.4 mg/dL — ABNORMAL HIGH (ref 0.3–1.2)
Total Protein: 5 g/dL — ABNORMAL LOW (ref 6.5–8.1)

## 2020-04-30 LAB — BASIC METABOLIC PANEL
Anion gap: 11 (ref 5–15)
BUN: <5 mg/dL — ABNORMAL LOW (ref 6–20)
CO2: 23 mmol/L (ref 22–32)
Calcium: 8 mg/dL — ABNORMAL LOW (ref 8.9–10.3)
Chloride: 98 mmol/L (ref 98–111)
Creatinine, Ser: 0.7 mg/dL (ref 0.44–1.00)
GFR calc Af Amer: >60 mL/min (ref >60)
GFR calc non Af Amer: >60 mL/min (ref >60)
Glucose, Bld: 63 mg/dL — ABNORMAL LOW (ref 70–99)
Potassium: 3.2 mmol/L — ABNORMAL LOW (ref 3.5–5.1)
Sodium: 132 mmol/L — ABNORMAL LOW (ref 135–145)

## 2020-04-30 LAB — GLUCOSE, CAPILLARY
Glucose-Capillary: 76 mg/dL (ref 70–99)
Glucose-Capillary: 142 mg/dL — ABNORMAL HIGH (ref 70–99)
Glucose-Capillary: 69 mg/dL — ABNORMAL LOW (ref 70–99)
Glucose-Capillary: 102 mg/dL — ABNORMAL HIGH (ref 70–99)
Glucose-Capillary: 85 mg/dL (ref 70–99)
Glucose-Capillary: 109 mg/dL — ABNORMAL HIGH (ref 70–99)

## 2020-04-30 LAB — PROTIME-INR
INR: 1.5 — ABNORMAL HIGH (ref 0.8–1.2)
Prothrombin Time: 17.6 seconds — ABNORMAL HIGH (ref 11.4–15.2)

## 2020-04-30 LAB — PHOSPHORUS: Phosphorus: 3.8 mg/dL (ref 2.5–4.6)

## 2020-04-30 LAB — MAGNESIUM: Magnesium: 1.7 mg/dL (ref 1.7–2.4)

## 2020-04-30 MED ORDER — MAGNESIUM OXIDE 400 (241.3 MG) MG PO TABS
400.0000 mg | ORAL_TABLET | Freq: Every day | ORAL | Status: DC
  Administered 2020-04-30 – 2020-05-01 (×2): 400 mg via ORAL
  Filled 2020-04-30 (×2): qty 1

## 2020-04-30 NOTE — Progress Notes (Signed)
PROGRESS NOTE    Gina Hooper  CZY:[id number]  DOB: 05-02-1991  PCP: Patient, No Pcp Per Admit date:04/04/2020 Chief compliant:  29 year old female with severe alcohol abuse, depression, anemia history presented initially 04/04/2020 regarding pain, nausea, vomiting, hypoglycemia at home. In the ED found to have acute liver failure, AKI creatinine 1.2, ALP 147 AST more than 10,000 ALT 4566 total bili 4.8 INR 9.9.  Hospital course:  Patient was admitted, underwent extensive hospitalization further evaluation with GI, treated with MAC, completed 04/07/2020, continued on IV Solu-Medrol, evaluated from a transplant standpoint and deemed not a candidate due to her underlying substance abuse and lack of insuranceHospital course complicated by hematemesis on 8/12 due to coagulopathy, felt to be high risk and poor endoscopy candidate and treated with PPI. Patient developed progressive worsening mentation and became minimally responsive, encephalopathic needing intubation 04/06/2020, MRI 8/14 no acute finding, seen by neurology, EEG 8/16 severe diffuse encephalopathy, no seizure, extubated 8/22, transferred to [facility] 8/24. Pt was placed on Precedex overnight 8/23 due to significant agitation and off 8/24 night. SLP started her on diet. PTOT is working with her. Patient transfer out of stepdown unit 8/27. Labs improving but still feels weak and deconditioned.  Subjective:  Patient resting comfortably.  She seems awake alert and communicating but still has abdominal distention and reports dyspnea on laying flat.  Objective: Vitals:   04/29/20 1741 04/29/20 2113 04/30/20 0444 04/30/20 1246  BP:  108/79 110/78 108/77  Pulse:  95 80 86  Resp:  18 18 (!) 21  Temp: 99.2 F (37.3 C) 100.2 F (37.9 C) 99.4 F (37.4 C) 98.2 F (36.8 C)  TempSrc: Oral Oral Oral Oral  SpO2:  91% 91% 94%  Weight:   70.3 kg   Height:       No intake or output data in the 24 hours ending 04/30/20 1921 Filed Weights    04/27/20 0500 04/29/20 0504 04/30/20 0444  Weight: 73.1 kg 69.7 kg 70.3 kg    Physical Examination:  General: Moderately built, no acute distress noted Head ENT: Atraumatic normocephalic, PERRLA, neck supple Heart: S1-S2 heard, regular rate and rhythm, no murmurs.  Trace leg edema noted Lungs: Equal air entry bilaterally, no rhonchi or rales on exam, no accessory muscle use Abdomen: Mild to moderately distended with ascites, bowel sounds heard, soft, nontender. Extremities: Trace pedal edema.  No cyanosis or clubbing. Neurological: Awake alert oriented x3, has generalized but no focal weakness or numbness, strength and sensations to crude touch intact Skin: No wounds or rashes.     Data Reviewed: I have personally reviewed following labs and imaging studies  CBC: Recent Labs  Lab 04/26/20 0357 04/27/20 0341 04/28/20 0350 04/29/20 0340 04/30/20 0807  WBC 10.5 9.7 9.8 10.2 10.3  NEUTROABS 8.1* 7.1 7.0 7.0 6.8  HGB 8.6* 8.3* 8.3* 9.4* 10.4*  HCT 26.5* 25.3* 25.8* 29.6* 32.7*  MCV 92.7 94.1 94.9 96.4 97.6  PLT 100* 126* 132* 189 093   Basic Metabolic Panel: Recent Labs  Lab 04/25/20 0419 04/25/20 1749 04/26/20 0357 04/27/20 0341 04/28/20 0350 04/29/20 0340 04/30/20 0500  NA   < >  --  132* 131* 129* 133* 132*  K   < >  --  3.2* 3.6 3.2* 3.1* 3.2*  CL   < >  --  98 98 97* 98 98  CO2   < >  --  _0 GLUCOSE   < >  --  80 93 68* 99 63*  BUN   < >  --  _0 5* <5*  CREATININE   < >  --  0.60 0.66 0.60 0.79 0.70  CALCIUM   < >  --  8.1* 8.0* 7.8* 8.4* 8.0*  MG  --  1.4*  --  1.5* 1.2* 1.6* 1.7  PHOS  --   --   --   --   --  3.8 3.8   < > = values in this interval not displayed.   GFR: Estimated Creatinine Clearance: 97.1 mL/min (by C-G formula based on SCr of 0.7 mg/dL). Liver Function Tests: Recent Labs  Lab 04/26/20 0357 04/27/20 0341 04/28/20 0350 04/29/20 0340 04/30/20 0500  AST 75* 81* 78* 85* 76*  ALT 71* 64* 60* 63* 54*  ALKPHOS 100 104  96 117 102  BILITOT 23.3* 21.3* 19.9* 20.6* 15.4*  PROT 5.0* 4.8* 4.8* 5.6* 5.0*  ALBUMIN 2.4* 2.3* 2.2* 2.5* 2.2*   No results for input(s): LIPASE, AMYLASE in the last 168 hours. No results for input(s): AMMONIA in the last 168 hours. Coagulation Profile: Recent Labs  Lab 04/30/20 1501  INR 1.5*   Cardiac Enzymes: No results for input(s): CKTOTAL, CKMB, CKMBINDEX, TROPONINI in the last 168 hours. BNP (last 3 results) No results for input(s): PROBNP in the last 8760 hours. HbA1C: No results for input(s): HGBA1C in the last 72 hours. CBG: Recent Labs  Lab 04/30/20 0557 04/30/20 0734 04/30/20 0813 04/30/20 1129 04/30/20 1726  GLUCAP 76 69* 142* 102* 85   Lipid Profile: No results for input(s): CHOL, HDL, LDLCALC, TRIG, CHOLHDL, LDLDIRECT in the last 72 hours. Thyroid Function Tests: No results for input(s): TSH, T4TOTAL, FREET4, T3FREE, THYROIDAB in the last 72 hours. Anemia Panel: No results for input(s): VITAMINB12, FOLATE, FERRITIN, TIBC, IRON, RETICCTPCT in the last 72 hours. Sepsis Labs: No results for input(s): PROCALCITON, LATICACIDVEN in the last 168 hours.  No results found for this or any previous visit (from the past 240 hour(s)).    Radiology Studies: No results found.    Scheduled Meds: . feeding supplement  1 Container Oral TID BM  . folic acid  1 mg Oral Daily  . furosemide  20 mg Intravenous Daily  . lactulose  30 g Oral Daily  . mouth rinse  15 mL Mouth Rinse BID  . multivitamin with minerals  1 tablet Oral Daily  . pantoprazole  40 mg Oral Daily  . potassium chloride  20 mEq Oral BID  . rifaximin  550 mg Oral BID  . sodium chloride flush  10-40 mL Intracatheter Q12H  . sodium chloride flush  10-40 mL Intracatheter Q12H  . spironolactone  25 mg Oral Daily  . tamsulosin  0.4 mg Oral QPC supper  . thiamine  100 mg Oral Daily   Or  . thiamine  100 mg Intravenous Daily   Continuous Infusions:    Assessment/Plan:  Acute liver failure  /Jaundice in the setting of acute alcohol hepatitis +/- possible Tylenol toxicity/possible SBP: Seen by GI, was discussed with the transplant team while in ICU and not felt to be a candidate for liver transplant. MELD-na 40, discriminant function 304 on admission. Liver function tests are significantly improved, total bili remains up 20.6 ,last INR at 1.4. Continue on current supportive measures. Patient completed ceftriaxone for possible SBP. Started on Aldactone/diuretics.  GI recommended continue current medication regimen with lactulose, Aldactone and Lasix.  Remains on IV Lasix 20 mg daily.  Patient however noted to have ascites, will  consult IR for possible paracentesis in a.m.  INR 1.5. Outpatient follow-up arranged with Wallace Going on October 13.  Acute hepatic/metabolic encephalopathy/acute alcohol withdrawal: She is back to baseline mental status but weak and deconditioned. She was seen  by neurology, MRI brain no acute finding,  EEG with diffuse encephalopathy. She is off restraint and off Precedex 8/24 . Cont Lactulose, folic acid multivitamin thiamine.  Anasarca with moderate ascites/bilateral pleural effusion:Patient has gained at least 33 pounds since admission. 147>> 180 with lasix, significant uop and wt 165> 162. Has uop abt 12 l /24 hr after iv lasix- changed to po lasix but wtwent up to176.5 pound-then placed back on IV Lasix, weight is improving, we will keep on IV Lasix another day before switching to p.o..  Continue Aldactone and consider thoracentesis as discussed above.  Saturating well on room air  Hypokalemia/hypomagnesemia:.  Potassium low again in the setting of IV diuresis,keep on KCl 20 meq p.o. bid.,  Aldactone should help.  Potassium 3.2 today.  Monitor mag-received oral/IV replacement and improved to 1.7.   Hyponatremiasodium initially elevated and now low with IV diuresis.  Sodium in the last 3 days however fluctuating 129-133.  Monitor and transition to oral  diuretics  Acute respiratory failure due to liver failure hepatoencephalopathy needing intubation-on vent for 9 days. On room air currently. Encourage incentive spirometry and ambulation.  Coagulopathy secondary to liver failure. monitor INR. Appears overall stable  Upper GI bleed/hematemesisin the setting of gastritis/coagulopathy seen by GI,  Poor endoscopy candidate,  monitor  Hb, remains stable and improved 8.3->9.4->10.4.  Continue PPI.  Anemia acute blood loss less/anemia of critical illness: s/p 1 unit PRBC hemoglobin has remained stable. Monitor hemoglobin and transfuse if less than 7 gms.  Hb remains stable @ 9 - 10 now.    Thrombocytopenia: in the setting of liver disease and alcohol abuse: Plateletplatelets are now uptrending finally.  Moderate protein calorie malnutrition:Continue to augment diet.  Leukocytosisit has resolved. Off antibiotics, earlier positive blood cultures likely contaminant.   Deconditioning:PT/OT consulted.Continue to mobilize her.    DVT prophylaxis: SCDs Code Status: Full code Communication: Discussed with patient and bedside nurse regarding plan of care.  Consulted IR Disposition Plan:   Status is: Inpatient  Remains inpatient appropriate because:Inpatient level of care appropriate due to severity of illness   Dispo: The patient is from: Home              Anticipated d/c is to: Home              Anticipated d/c date is: 1-2 days              Patient currently is not medically stable to d/c.    Time spent: 25 mins    >50% time spent in discussions with care team and coordination of care.    Guilford Shi, MD [facility] Pager in [place]  If 7PM-7AM, please contact night-coverage [email or web address] 04/30/2020, 7:21 PM

## 2020-04-30 NOTE — Progress Notes (Signed)
Occupational Therapy Progress Note  Patient require encouragement but agreeable to get OOB to use bathroom. Patient supervision for bed mobility and min G for functional ambulation and toilet transfer for safety due to mild unsteadiness. Patient declined use of rolling walker. Patient I with peri care and supervision standing sink side to wash hands. Patient min G to transfer to recliner, encourage patient to sit up as tolerated vs in bed to maximize activity tolerance, patient agreeable. Patient declined CIR, recommend initial 24/7 supervision at home for safety. Goals updated and will continue to follow.

## 2020-04-30 NOTE — Progress Notes (Addendum)
Physical Therapy Treatment Patient Details Name: Gina Hooper MRN: [id number] DOB: 10/10/1990 Today's Date: 04/30/2020    History of Present Illness Pt admitted with acute encephalopathy and noted to be in acute liver failure in the setting of Alchoholic Hepatitis with profound coagulopathy and hematemesis. ETT x9 days. Pt with hx of ETOH, polysubstance abuse, osteomyolitis of L ankle.  Pt admitted for Acute liver failure/Jaundice in the setting of acute alcohol hepatitis +/- possible Tylenol toxicity/possible SBP    PT Comments    Progressing slowly with mobility. Pt is agreeable to using a RW for ambulation safety.    Follow Up Recommendations  Home health PT;Supervision for mobility/OOB     Equipment Recommendations  Rolling walker with 5" wheels    Recommendations for Other Services       Precautions / Restrictions Precautions Precautions: Fall Restrictions Weight Bearing Restrictions: No    Mobility  Bed Mobility Overal bed mobility: Needs Assistance Bed Mobility: Supine to Sit;Sit to Supine     Supine to sit: Supervision Sit to supine: Supervision      Transfers Overall transfer level: Needs assistance Equipment used: None Transfers: Sit to/from Stand   Stand pivot transfers: Min guard       General transfer comment: Close min guard for safety.  Ambulation/Gait Ambulation/Gait assistance: Min assist Gait Distance (Feet): 40 Feet (x2) Assistive device:  (hallway handrail vs 1 HHA) Gait Pattern/deviations: Decreased step length - left;Decreased stride length;Step-through pattern;Narrow base of support;Decreased step length - right     General Gait Details: slow, guarded gait with near shuffling steps. Dyspnea 2-3/4. O2 93% on RA. Seated rest break needed due to fatigue, c/o dizziness.   Stairs             Wheelchair Mobility    Modified Rankin (Stroke Patients Only)       Balance Overall balance assessment: Needs assistance            Standing balance-Leahy Scale: Fair                              Cognition Arousal/Alertness: Awake/alert Behavior During Therapy: WFL for tasks assessed/performed Overall Cognitive Status: Within Functional Limits for tasks assessed                                        Exercises      General Comments        Pertinent Vitals/Pain Pain Assessment: Faces Faces Pain Scale: Hurts little more Pain Location: abdomen Pain Descriptors / Indicators: Discomfort Pain Intervention(s): Monitored during session;Limited activity within patient's tolerance    Home Living                      Prior Function            PT Goals (current goals can now be found in the care plan section) Progress towards PT goals: Progressing toward goals    Frequency    Min 3X/week      PT Plan Current plan remains appropriate    Co-evaluation              AM-PAC PT "6 Clicks" Mobility   Outcome Measure  Help needed turning from your back to your side while in a flat bed without using bedrails?: A Little Help needed moving from lying on your  back to sitting on the side of a flat bed without using bedrails?: A Little Help needed moving to and from a bed to a chair (including a wheelchair)?: A Little Help needed standing up from a chair using your arms (e.g., wheelchair or bedside chair)?: A Little Help needed to walk in hospital room?: A Little Help needed climbing 3-5 steps with a railing? : A Little 6 Click Score: 18    End of Session Equipment Utilized During Treatment: Gait belt Activity Tolerance: Patient limited by fatigue Patient left: in bed;with call bell/phone within reach;with bed alarm set   PT Visit Diagnosis: Unsteadiness on feet (R26.81);Muscle weakness (generalized) (M62.81);Difficulty in walking, not elsewhere classified (R26.2)     Time: 8325-4982 PT Time Calculation (min) (ACUTE ONLY): 18 min  Charges:  $Gait  Training: 8-22 mins                        Faye Ramsay, PT Acute Rehabilitation  Office: [phone] Pager: [phone]

## 2020-05-01 ENCOUNTER — Inpatient Hospital Stay (HOSPITAL_COMMUNITY): Payer: Medicaid Other

## 2020-05-01 DIAGNOSIS — R6510 Systemic inflammatory response syndrome (SIRS) of non-infectious origin without acute organ dysfunction: Secondary | ICD-10-CM

## 2020-05-01 DIAGNOSIS — K759 Inflammatory liver disease, unspecified: Secondary | ICD-10-CM

## 2020-05-01 LAB — COMPREHENSIVE METABOLIC PANEL
ALT: 51 U/L — ABNORMAL HIGH (ref 0–44)
AST: 78 U/L — ABNORMAL HIGH (ref 15–41)
Albumin: 2.3 g/dL — ABNORMAL LOW (ref 3.5–5.0)
Alkaline Phosphatase: 105 U/L (ref 38–126)
Anion gap: 8 (ref 5–15)
BUN: <5 mg/dL — ABNORMAL LOW (ref 6–20)
CO2: 23 mmol/L (ref 22–32)
Calcium: 8.4 mg/dL — ABNORMAL LOW (ref 8.9–10.3)
Chloride: 104 mmol/L (ref 98–111)
Creatinine, Ser: 0.72 mg/dL (ref 0.44–1.00)
GFR calc Af Amer: >60 mL/min (ref >60)
GFR calc non Af Amer: >60 mL/min (ref >60)
Glucose, Bld: 78 mg/dL (ref 70–99)
Potassium: 4 mmol/L (ref 3.5–5.1)
Sodium: 135 mmol/L (ref 135–145)
Total Bilirubin: 15.7 mg/dL — ABNORMAL HIGH (ref 0.3–1.2)
Total Protein: 5.6 g/dL — ABNORMAL LOW (ref 6.5–8.1)

## 2020-05-01 LAB — GLUCOSE, CAPILLARY
Glucose-Capillary: 108 mg/dL — ABNORMAL HIGH (ref 70–99)
Glucose-Capillary: 90 mg/dL (ref 70–99)

## 2020-05-01 MED ORDER — ADULT MULTIVITAMIN W/MINERALS CH: 1.0000 | ORAL_TABLET | Freq: Every day | ORAL | Status: DC

## 2020-05-01 MED ORDER — FUROSEMIDE 20 MG PO TABS: 20.0000 mg | ORAL_TABLET | Freq: Every day | ORAL | 11 refills | Status: DC

## 2020-05-01 MED ORDER — FOLIC ACID 1 MG PO TABS
1.0000 mg | ORAL_TABLET | Freq: Every day | ORAL | 1 refills | Status: DC
Start: 2020-05-02 — End: 2021-02-28

## 2020-05-01 MED ORDER — THIAMINE HCL 100 MG PO TABS
100.0000 mg | ORAL_TABLET | Freq: Every day | ORAL | 1 refills | Status: DC
Start: 2020-05-02 — End: 2021-02-28

## 2020-05-01 MED ORDER — SPIRONOLACTONE 25 MG PO TABS: 25.0000 mg | ORAL_TABLET | Freq: Every day | ORAL | 1 refills | Status: DC

## 2020-05-01 MED ORDER — LIDOCAINE HCL 1 % IJ SOLN
INTRAMUSCULAR | Status: AC
  Filled 2020-05-01: qty 20

## 2020-05-01 MED ORDER — RIFAXIMIN 550 MG PO TABS
550.0000 mg | ORAL_TABLET | Freq: Two times a day (BID) | ORAL | 0 refills | Status: DC
Start: 2020-05-01 — End: 2020-05-01

## 2020-05-01 MED ORDER — POTASSIUM CHLORIDE CRYS ER 20 MEQ PO TBCR: 20.0000 meq | EXTENDED_RELEASE_TABLET | Freq: Every day | ORAL | 0 refills | Status: DC

## 2020-05-01 MED ORDER — LACTULOSE 10 GM/15ML PO SOLN: 30.0000 g | Freq: Every day | ORAL | 0 refills | Status: DC

## 2020-05-01 MED ORDER — MAGNESIUM OXIDE 400 (241.3 MG) MG PO TABS
400.0000 mg | ORAL_TABLET | Freq: Every day | ORAL | 0 refills | Status: DC
Start: 2020-05-02 — End: 2020-10-02

## 2020-05-01 MED ORDER — DICLOFENAC SODIUM 1 % EX GEL: 2.0000 g | Freq: Four times a day (QID) | CUTANEOUS | 0 refills | Status: DC | PRN

## 2020-05-01 MED ORDER — PANTOPRAZOLE SODIUM 40 MG PO TBEC
40.0000 mg | DELAYED_RELEASE_TABLET | Freq: Every day | ORAL | 2 refills | Status: DC
Start: 2020-05-02 — End: 2020-10-02

## 2020-05-01 NOTE — Plan of Care (Signed)
  Problem: Coping: Goal: Level of anxiety will decrease Outcome: Progressing   Problem: Education: Goal: Knowledge of General Education information will improve Description: Including pain rating scale, medication(s)/side effects and non-pharmacologic comfort measures Outcome: Completed/Met   Problem: Activity: Goal: Risk for activity intolerance will decrease Outcome: Completed/Met   Problem: Nutrition: Goal: Adequate nutrition will be maintained Outcome: Completed/Met

## 2020-05-01 NOTE — Progress Notes (Signed)
Patient ID: Gina Hooper, female   DOB: 1991/08/01, 29 y.o.   MRN: [id number] Pt presented to [place] dept today for paracentesis. Details/risks of procedure d/w pt. She has decided not to proceed with paracentesis today, stating that she has"been through too much already". Procedure cancelled.

## 2020-05-01 NOTE — TOC Transition Note (Signed)
Transition of Care [facility]) - CM/SW Discharge Note   Patient Details  Name: Gina Hooper MRN: [id number] Date of Birth: April 11, 1991  [facility] CM/SW Contact:  Darleene Cleaver, LCSW Phone Number: 05/01/2020, 5:39 PM   Clinical Narrative:     CSW was informed that patient needs assistance with her medications.  CSW looked to see if patient is eligible for Match program, per database, patient has used the Match program already this year.  CSW was able to find Goodrx coupons to help patient with cost of medications.  CSW was informed that patient will be going to her grandmother's house.  Patient does not have a PCP, CSW provided contact information for [facility]ness clinic.  CSW attempted to find a home health agency to see if they can accept patient, however because patient does not have insurance and has history of drug use, the agencies are refusing to accept patient.  Patient will be disharging today, CSW signing off.  Final next level of care: Home/Self Care Barriers to Discharge: No Home Care Agency will accept this patient, Inadequate or no insurance   Patient Goals and CMS Choice Patient states their goals for this hospitalization and ongoing recovery are:: to go home CMS Medicare.gov Compare Post Acute Care list provided to:: Patient Choice offered to / list presented to : Patient  Discharge Placement                       Discharge Plan and Services In-house Referral: Financial Counselor Discharge Planning Services: CM Consult              DME Agency: [employer] Date DME Agency Contacted: 05/01/20 Time DME Agency Contacted: 1430   HH Arranged: NA (Patient unable to receive any home heatlh services due to drug use and not having any insurance.)          Social Determinants of Health (SDOH) Interventions     Readmission Risk Interventions No flowsheet data found.

## 2020-05-01 NOTE — Progress Notes (Signed)
  Speech Language Pathology Treatment: Dysphagia  Patient Details Name: Gina Hooper MRN: [id number] DOB: [birth year] Today's Date: 05/01/2020 Time: 1559-1610 SLP Time Calculation (min) (ACUTE ONLY): 11 min  Assessment / Plan / Recommendation Clinical Impression  SlP spoke to pt and her friend Gina Hooper on the phone as SlP did not know pt prior to admission.  Pt today appears with mild congestion - but denies having signfiicant issues.  Per notes, she was not agreeable to paracentesis and thus plan to dc in place.  Reviewed with pt importance of conducting her Respiratory Muscle Exercises to maximize swallow/voice abilities.  Advised if her voice does not return to normal before her visit with PCP - to speak to PCP regarding possible ENT referral to be scoped to examine vocal folds.  Also advised if pt is experiencing more dyspnea that may impact her swallow coordination to speak to her PCP as concern for possible compressive component of fluid.  Wrote instructions for pt on her dc information packet.  No SLP follow up indicated as pt tolerating diet well and aware of exercises to conduct to improve phonation/swallow etc.  Thanks.    HPI HPI: 29 yo female adm to [facility] with AMS alcoholic dependence and polysubstance abuse. She presented to the ED on 8/11 with abdominal pain and N/V. She was found to be in acute liver failure in the setting of alcoholic hepatitis with profound coagulopathy and hematemesis. CCM evaluated 8/12 due to declining mental status and concern about IV access. Pt was intubated x9 days- extubated yesterday am 04/15/2020. Swallow eval ordered.  Pt was started on a modified diet yesterday but per RN, she was coughing excessively with it and the trays were held.  MBS conducted with pt started on dys3/thin diet.  Follow up to assess po tolerance and initiate RMST conducted.      SLP Plan  All goals met       Recommendations  Diet recommendations: Regular;Thin liquid Liquids provided  via: Straw;Cup Medication Administration: Other (Comment) (as tolerated) Supervision: Patient able to self feed Compensations: Slow rate;Small sips/bites Postural Changes and/or Swallow Maneuvers: Seated upright 90 degrees;Upright 30-60 min after meal                Oral Care Recommendations: Oral care BID Follow up Recommendations: None SLP Visit Diagnosis: Dysphagia, oropharyngeal phase (R13.12) Plan: All goals met       GO              Kathleen Lime, MS [facility] SLP Acute Rehab Services Office [phone]   Macario Golds 05/01/2020, 4:17 PM

## 2020-05-01 NOTE — Discharge Summary (Addendum)
Physician Discharge Summary  Gina Hooper UGQ:[id number] DOB: [birth year] DOA: 04/04/2020  PCP: Patient, No Pcp Per  Admit date: 04/04/2020 Discharge date: 05/01/2020 Consultations: Gastroenterology , Pulmonary critical care Admitted From: home Disposition: home  Discharge Diagnoses:  Active Problems:   Hepatitis   Acute liver failure   Alcohol abuse   Thrombocytopenia (HCC)   Coagulopathy (HCC)   SIRS (systemic inflammatory response syndrome) (HCC)   Acute respiratory failure with hypoxia (HCC)   Polysubstance abuse [facility])   Hospital Course Summary: 29 year old female with severe alcohol abuse, depression, anemia history presented initially 04/04/2020 regarding pain, nausea, vomiting, hypoglycemia at home. In the ED found to have acute liver failure, AKI creatinine 1.2, ALP 147 AST more than 10,000 ALT 4566 total bili 4.8 INR 9.9.  Hospital course: Patient was admitted, underwent extensive hospitalization further evaluation with GI, treated with NAC, completed 04/07/2020, continued on IV Solu-Medrol, evaluated from a transplant standpoint and deemed not a candidate due to her underlying substance abuse and lack of insuranceHospital course complicated by hematemesis on 8/12 due to coagulopathy, felt to be high risk and poor endoscopy candidate and treated with PPI. Patient developed progressive worsening mentation and became minimally responsive, encephalopathic needing intubation 04/06/2020, MRI 8/14 no acute finding, seen by neurology, EEG 8/16 severe diffuse encephalopathy, no seizure, extubated 8/22, transferred to [facility] 8/24. Pt was placed on Precedex overnight 8/23 due to significant agitation and off 8/24 night. SLP started her on diet. PTOT is working with her. Patient transfer out of stepdown unit 8/27. Labs improvingbut still feels weak and deconditioned.  Acute liver failure /Jaundice in the setting of acute alcohol hepatitis +/- possible Tylenol toxicity/possible SBP: Seen by GI,  was discussed with the transplant team while in ICU and not felt to be a candidate for liver transplant. MELD-na 40, discriminant function 304 on admission. Liver function tests are significantly improved, total bili remains up20.6,last INR at 1.4.  Patient completed ceftriaxone for possible SBP. Started onAldactone/diuretics. GI recommended continue current medication regimen with lactulose,Aldactone and Lasix.  Recieving IV Lasix 20 mg daily with BID potassium replacement while here. Will discharge with Lasix 20 mg daily and potassium daily.  Patient noted to have mild ascites, consulted IR for possible paracentesis and underwent USG abdomen revealing moderate ascites but patient declined intervention and wanted to wait on tap for now.. Clinically she looks comfortable with only mild abdominal fullness/distension, saturating well on RA. She will need PCP set up /follow up closely. Outpatient GI follow-up arranged with Lucrezia Europe on October 13.  Acute hepatic/metabolic encephalopathy/acute alcohol withdrawal: She is back to baseline mental status but weak and deconditioned. She was seen by neurology, MRI brain no acute finding, EEG with diffuse encephalopathy. She is off restraint and off Precedex 8/24 . Cont Lactulose, folic acid multivitamin thiamine. Patient unable to afford Rifaximin, per GI can titrate lactulose to BID with goal for 2 BM/day  Anasarca with moderate ascites/bilateral pleural effusion:Patient has gained at least 33 pounds since admission. 147>> 180 with lasix, significant uop and wt 165> 162. Has uop abt 12 l /24 hr after iv lasix- changed to po lasix but wtwent up to176.5 pound-then placed back on IV Lasix, weight is improving, we will transition to oral Lasix.  Continue Aldactone and considered paracentesis as discussed above.  Saturating well on room air  Hypokalemia/hypomagnesemia:.  Potassium refractory and low on daily labs in the setting of IV  diuresis,improved today with KCl 20 meq p.o. bid x 4 days and  additional dosing yesterday.,  Aldactone should help.  Potassium 4.0 today.  Magnesium replaced  oral/IV replacement and improved to 1.7.   Hyponatremiasodium initially elevated and now low with IV diuresis.  Sodium in the last 3 days however fluctuating 129-133. Today normalized to 135.   Monitor on oral diuretics  Acute respiratory failure due to liver failure hepatoencephalopathy needing intubation-on vent for 9 days. On room air currently. Encourage incentive spirometry and ambulation.   Upper GI bleed/hematemesisin the setting of gastritis/coagulopathy with INR 1.5,  seen by GI, Poor endoscopy candidate,monitor Hb, remains stable and improved 8.3->9.4->10.4.  Continue PPI on discharge  Anemia acute blood loss less/anemia of critical illness: s/p 1 unit PRBC hemoglobin has remained stable. Monitor hemoglobin and transfuse if less than 7 gms. Hb remains stable @ 9 - 10 now.    Thrombocytopenia: in the setting of liver disease and alcohol abuse: Plateletplatelets are now uptrending finally.  Moderate protein calorie malnutrition:Continue to augment diet.  Leukocytosisit has resolved. Off antibiotics, earlier positive blood cultures likely contaminant.   Deconditioning:PT/OT consulted-recommended Home PT and rolling walker   Discharge Exam:   Vitals:   04/30/20 1246 04/30/20 2116 05/01/20 0502 05/01/20 1124  BP: 108/77 111/81 119/78 117/86  Pulse: 86 91 82   Resp: (!) 21 20 20    Temp: 98.2 F (36.8 C) 100.1 F (37.8 C) 99.3 F (37.4 C)   TempSrc: Oral Oral Oral   SpO2: 94% 97% 93%   Weight:   67.5 kg   Height:        General: Pt is alert, awake, not in acute distress Cardiovascular: RRR, S1/S2 +, no rubs, no gallops Respiratory: CTA bilaterally, no wheezing, no rhonchi Abdominal: Soft, NT, ND, bowel sounds + Extremities: no edema, no cyanosis  Discharge Condition:Stable CODE STATUS:  Full code Diet recommendation: 2 gram sodium diet Recommendations for Outpatient Follow-up:  1. Follow up with PCP: 1 week 2. Follow up with consultants: GI as scheduled October 13. 3. Please obtain follow up labs including: BMP , CBC  Home Health services upon discharge: HH PT Equipment/Devices upon discharge: Rolling walker   Discharge Instructions:  Discharge Instructions    (HEART FAILURE PATIENTS) Call MD:  Anytime you have any of the following symptoms: 1) 3 pound weight gain in 24 hours or 5 pounds in 1 week 2) shortness of breath, with or without a dry hacking cough 3) swelling in the hands, feet or stomach 4) if you have to sleep on extra pillows at night in order to breathe.   Complete by: As directed    Call MD for:  difficulty breathing, headache or visual disturbances   Complete by: As directed    Call MD for:  extreme fatigue   Complete by: As directed    Call MD for:  persistant dizziness or light-headedness   Complete by: As directed    Call MD for:  persistant nausea and vomiting   Complete by: As directed    Call MD for:  severe uncontrolled pain   Complete by: As directed    Call MD for:  temperature >100.4   Complete by: As directed    Diet - low sodium heart healthy   Complete by: As directed    Increase activity slowly   Complete by: As directed      Allergies as of 05/01/2020   No Active Allergies     Medication List    TAKE these medications   diclofenac Sodium 1 % Gel Commonly known  as: Voltaren Apply 2 g topically 4 (four) times daily as needed.   folic acid 1 MG tablet Commonly known as: FOLVITE Take 1 tablet (1 mg total) by mouth daily. Start taking on: May 02, 2020   furosemide 20 MG tablet Commonly known as: Lasix Take 1 tablet (20 mg total) by mouth daily. Can take extra as needed for edema/fluid retention   lactulose 10 GM/15ML solution Commonly known as: CHRONULAC Take 45 mLs (30 g total) by mouth daily. Start taking on:  May 02, 2020   magnesium oxide 400 (241.3 Mg) MG tablet Commonly known as: MAG-OX Take 1 tablet (400 mg total) by mouth daily. Start taking on: May 02, 2020   multivitamin with minerals Tabs tablet Take 1 tablet by mouth daily. Start taking on: May 02, 2020   pantoprazole 40 MG tablet Commonly known as: PROTONIX Take 1 tablet (40 mg total) by mouth daily. Start taking on: May 02, 2020   potassium chloride SA 20 MEQ tablet Commonly known as: KLOR-CON Take 1 tablet (20 mEq total) by mouth daily for 15 days.   rifaximin 550 MG Tabs tablet Commonly known as: XIFAXAN Take 1 tablet (550 mg total) by mouth 2 (two) times daily.   spironolactone 25 MG tablet Commonly known as: ALDACTONE Take 1 tablet (25 mg total) by mouth daily. Start taking on: May 02, 2020   thiamine 100 MG tablet Take 1 tablet (100 mg total) by mouth daily. Start taking on: May 02, 2020       Follow-up Information    Meredith Pel, NP. Go on 06/06/2020.   Specialty: Gastroenterology Why: Appointment with Jarvis Newcomer  Oct 13 at 2;30 pm. Arrive 20 minutes before appt  3rd floor [facility] - [address] [place] Contact information: [address] [place] [address] [phone]              No Active Allergies    The results of significant diagnostics from this hospitalization (including imaging, microbiology, ancillary and laboratory) are listed below for reference.    Labs: BNP (last 3 results) No results for input(s): BNP in the last 8760 hours. Basic Metabolic Panel: Recent Labs  Lab 04/25/20 1749 04/26/20 0357 04/27/20 0341 04/28/20 0350 04/29/20 0340 04/30/20 0500 05/01/20 0514  NA  --    < > 131* 129* 133* 132* 135  K  --    < > 3.6 3.2* 3.1* 3.2* 4.0  CL  --    < > 98 97* 98 98 104  CO2  --    < > 24 24 25 23 23   GLUCOSE  --    < > 93 68* 99 63* 78  BUN  --    < > 8 6 5* <5* <5*  CREATININE  --    < > 0.66 0.60 0.79 0.70 0.72   CALCIUM  --    < > 8.0* 7.8* 8.4* 8.0* 8.4*  MG 1.4*  --  1.5* 1.2* 1.6* 1.7  --   PHOS  --   --   --   --  3.8 3.8  --    < > = values in this interval not displayed.   Liver Function Tests: Recent Labs  Lab 04/27/20 0341 04/28/20 0350 04/29/20 0340 04/30/20 0500 05/01/20 0514  AST 81* 78* 85* 76* 78*  ALT 64* 60* 63* 54* 51*  ALKPHOS 104 96 117 102 105  BILITOT 21.3* 19.9* 20.6* 15.4* 15.7*  PROT 4.8* 4.8* 5.6* 5.0*  5.6*  ALBUMIN 2.3* 2.2* 2.5* 2.2* 2.3*   No results for input(s): LIPASE, AMYLASE in the last 168 hours. No results for input(s): AMMONIA in the last 168 hours. CBC: Recent Labs  Lab 04/26/20 0357 04/27/20 0341 04/28/20 0350 04/29/20 0340 04/30/20 0807  WBC 10.5 9.7 9.8 10.2 10.3  NEUTROABS 8.1* 7.1 7.0 7.0 6.8  HGB 8.6* 8.3* 8.3* 9.4* 10.4*  HCT 26.5* 25.3* 25.8* 29.6* 32.7*  MCV 92.7 94.1 94.9 96.4 97.6  PLT 100* 126* 132* 189 265   Cardiac Enzymes: No results for input(s): CKTOTAL, CKMB, CKMBINDEX, TROPONINI in the last 168 hours. BNP: Invalid input(s): POCBNP CBG: Recent Labs  Lab 04/30/20 1129 04/30/20 1726 04/30/20 2114 05/01/20 0752 05/01/20 1223  GLUCAP 102* 85 109* 108* 90   D-Dimer No results for input(s): DDIMER in the last 72 hours. Hgb A1c No results for input(s): HGBA1C in the last 72 hours. Lipid Profile No results for input(s): CHOL, HDL, LDLCALC, TRIG, CHOLHDL, LDLDIRECT in the last 72 hours. Thyroid function studies No results for input(s): TSH, T4TOTAL, T3FREE, THYROIDAB in the last 72 hours.  Invalid input(s): FREET3 Anemia work up No results for input(s): VITAMINB12, FOLATE, FERRITIN, TIBC, IRON, RETICCTPCT in the last 72 hours. Urinalysis    Component Value Date/Time   COLORURINE AMBER (A) 04/13/2020 1004   APPEARANCEUR CLEAR 04/13/2020 1004   LABSPEC 1.026 04/13/2020 1004   PHURINE 6.0 04/13/2020 1004   GLUCOSEU 50 (A) 04/13/2020 1004   HGBUR MODERATE (A) 04/13/2020 1004   BILIRUBINUR MODERATE (A)  04/13/2020 1004   KETONESUR NEGATIVE 04/13/2020 1004   PROTEINUR NEGATIVE 04/13/2020 1004   UROBILINOGEN 0.2 10/23/2011 1604   NITRITE NEGATIVE 04/13/2020 1004   LEUKOCYTESUR NEGATIVE 04/13/2020 1004   Sepsis Labs Invalid input(s): PROCALCITONIN,  WBC,  LACTICIDVEN Microbiology No results found for this or any previous visit (from the past 240 hour(s)).  Procedures/Studies: EEG  Result Date: 04/09/2020 Gina Quest, MD     04/09/2020 12:06 PM [name] MRN: [id number] Epilepsy Attending: Charlsie Hooper Referring Physician/Provider: Dr. Brooke Dare Date: 04/09/2020 Duration: 23.36 minutes Patient history: 29 year old female with history of alcohol and drug abuse with altered mental status.  EEG to evaluate for seizures. Level of alertness: Comatose AEDs during EEG study: None Technical aspects: This EEG study was done with scalp electrodes positioned according to the 10-20 International system of electrode placement. Electrical activity was acquired at a sampling rate of 500Hz  and reviewed with a high frequency filter of 70Hz  and a low frequency filter of 1Hz . EEG data were recorded continuously and digitally stored. Description: EEG showed continuous rhythmic 2 to 3 Hz generalized delta activity.  EEG was reactive to tactile stimulation.  Hyperventilation and photic stimulation were not performed.   ABNORMALITY -Continuous rhythmic delta slowing, generalized IMPRESSION: This study is suggestive of severe diffuse encephalopathy, nonspecific etiology.  No seizures or epileptiform discharges were seen throughout the recording.   DG Abd 1 View  Result Date: 04/13/2020 CLINICAL DATA:  OG tube placement EXAM: ABDOMEN - 1 VIEW COMPARISON:  Radiograph 04/13/2020 FINDINGS: Transesophageal tube tip terminates in the mid abdomen with the side port distal to the GE junction reflecting some satisfactory advancement from the prior exam. External support devices and  telemetry leads overlie the abdomen. No acute osseous or soft tissue abnormalities are seen. IMPRESSION: Satisfactory positioning of the transesophageal tube tip likely approximating the gastric antrum/distal body. Electronically Signed   By: Gina Hooper.D.  On: 04/13/2020 06:15   DG Abd 1 View  Result Date: 04/13/2020 CLINICAL DATA:  Aspiration EXAM: ABDOMEN - 1 VIEW; PORTABLE CHEST - 1 VIEW COMPARISON:  Radiograph 04/12/2020 FINDINGS: Transesophageal tube tip in the left upper quadrant, side port positioned at the level of the GE junction. Consider advancing 2-3 cm for optimal positioning. Endotracheal tube tip terminates in the mid trachea, 3.6 cm from the carina. External support devices and telemetry leads overlie the chest. Left antecubital IV cannula noted. Few streaky opacities are present in the right lung base with low lung volumes possibly reflecting atelectasis though sequela of aspiration is not excluded. No pneumothorax. No visible effusion. Stable cardiomediastinal contours. No acute osseous or soft tissue abnormality. IMPRESSION: 1. Transesophageal tube tip in the left upper quadrant, side port positioned at the level of the GE junction. Consider advancing 2-3 cm for optimal positioning. 2. Satisfactory positioning of the endotracheal tube. 3. Low lung volumes with streaky opacities in the right base which could reflect atelectasis or features of aspiration or early airspace disease. Electronically Signed   By: Kreg Shropshire M.D.   On: 04/13/2020 02:26   DG Abd 1 View  Result Date: 04/06/2020 CLINICAL DATA:  Hepatitis.  Liver failure.  Alcohol abuse. EXAM: ABDOMEN - 1 VIEW COMPARISON:  None. FINDINGS: The bowel gas pattern is normal. No radio-opaque calculi or other significant radiographic abnormality are seen. IMPRESSION: Normal examination. Electronically Signed   By: Beckie Salts M.D.   On: 04/06/2020 08:34   CT HEAD WO CONTRAST  Result Date: 04/06/2020 CLINICAL DATA:  Mental  status change EXAM: CT HEAD WITHOUT CONTRAST TECHNIQUE: Contiguous axial images were obtained from the base of the skull through the vertex without intravenous contrast. COMPARISON:  February 08, 2014, November 28, 2017 FINDINGS: Brain: No evidence of acute infarction, hemorrhage, hydrocephalus, extra-axial collection or mass lesion/mass effect. Vascular: No hyperdense vessel or unexpected calcification. Skull: Normal. Negative for fracture or focal lesion. Sinuses/Orbits: No acute finding. Other: None. IMPRESSION: No acute intracranial abnormality. Electronically Signed   By: Meda Klinefelter MD   On: 04/06/2020 15:52   MR BRAIN W WO CONTRAST  Result Date: 04/06/2020 CLINICAL DATA:  Mental status change EXAM: MRI HEAD WITHOUT AND WITH CONTRAST TECHNIQUE: Multiplanar, multiecho pulse sequences of the brain and surrounding structures were obtained without and with intravenous contrast. CONTRAST:  7mL GADAVIST GADOBUTROL 1 MMOL/ML IV SOLN COMPARISON:  04/06/2020 head CT. FINDINGS: Brain: No focal parenchymal signal abnormality. No acute infarct or intracranial hemorrhage. No midline shift, ventriculomegaly or extra-axial fluid collection. No mass lesion. No abnormal enhancement. Vascular: Normal flow voids. Skull and upper cervical spine: Normal marrow signal. Tiny incidental left temporomandibular joint cyst. Sinuses/Orbits: Normal orbits. Minimal to mild pansinus mucosal thickening. No mastoid effusion. Other: None. IMPRESSION: No acute intracranial process. Mild pansinus disease. Electronically Signed   By: Stana Bunting M.D.   On: 04/06/2020 22:13   CT ABDOMEN PELVIS W CONTRAST  Result Date: 04/22/2020 CLINICAL DATA:  29 year old female with abdominal distension. No urine output. EXAM: CT ABDOMEN AND PELVIS WITH CONTRAST TECHNIQUE: Multidetector CT imaging of the abdomen and pelvis was performed using the standard protocol following bolus administration of intravenous contrast. CONTRAST:  OMNIPAQUE  IOHEXOL 300 MG/ML  SOLN COMPARISON:  CT dated 04/04/2020. FINDINGS: Lower chest: Partially visualized small bilateral pleural effusions with associated bibasilar compressive atelectasis versus infiltrate. Interval increase in the size of the pleural effusion and associated pulmonary densities since the prior CT. No intra-abdominal free air. There is  moderate ascites, new since the prior CT. Hepatobiliary: The liver is slightly heterogeneous, otherwise unremarkable. No intrahepatic biliary ductal dilatation. No calcified gallstone. Pancreas: The pancreas is unremarkable. Evaluation for pancreatitis or pancreatic inflammation is limited due to ascites. Spleen: Normal in size without focal abnormality. Adrenals/Urinary Tract: The adrenal glands unremarkable. There is no hydronephrosis on either side. There is symmetric enhancement and excretion of contrast by both kidneys. The visualized ureters appear unremarkable. The urinary bladder is decompressed around a Foley catheter. Stomach/Bowel: There is a small hiatal hernia. Diffuse thickened appearance of the wall of the stomach and loops of small bowel, likely related to ascites. Clinical correlation is recommended to evaluate for possibility of gastroenteritis. There is no bowel obstruction. No CT findings of acute appendicitis. Vascular/Lymphatic: The abdominal aorta and IVC unremarkable. No portal venous gas. There is no adenopathy. Reproductive: There is a 4.5 cm posterior uterine fibroid. There is a 2 cm right ovarian dominant follicle or cyst. Other: Diffuse subcutaneous edema and anasarca. Musculoskeletal: No acute or significant osseous findings. IMPRESSION: 1. Moderate ascites and anasarca, new since the prior CT. 2. Interval increase in the size of bilateral pleural effusion and associated atelectasis or infiltrate. 3. Thickened appearance of the wall of the stomach and loops of small bowel, likely related to ascites. Clinical correlation is recommended to  evaluate for possibility of gastroenteritis. No bowel obstruction. 4. Uterine fibroid. 5. A 2 cm right ovarian dominant follicle or cyst. Electronically Signed   By: Elgie Collard M.D.   On: 04/22/2020 00:44   CT ABDOMEN PELVIS W CONTRAST  Result Date: 04/04/2020 CLINICAL DATA:  Patient c/o emesis and hypoglycemia Pt called EMS tonight for c/o vomiting. EXAM: CT ABDOMEN AND PELVIS WITH CONTRAST TECHNIQUE: Multidetector CT imaging of the abdomen and pelvis was performed using the standard protocol following bolus administration of intravenous contrast. CONTRAST:  OMNIPAQUE IOHEXOL 300 MG/ML  SOLN COMPARISON:  None. FINDINGS: Lower chest: Small right pleural effusion. Hepatobiliary: Borderline hepatomegaly with diffuse patchy low-attenuation concerning for acute inflammation. No focal liver lesion identified. There is gallbladder wall thickening with pericholecystic fluid, likely reactive. No gallstones identified. No intra or extrahepatic biliary duct dilation. Pancreas: Unremarkable. No pancreatic ductal dilatation or surrounding inflammatory changes. Spleen: Normal in size without focal abnormality. Adrenals/Urinary Tract: Adrenal glands are unremarkable. Kidneys are normal, without renal calculi, focal lesion, or hydronephrosis. Bladder is unremarkable. Stomach/Bowel: Stomach is within normal limits. No evidence of bowel wall thickening, distention, or inflammatory changes. No evidence of obstruction. Vascular/Lymphatic: No significant vascular findings are present. No enlarged abdominal or pelvic lymph nodes. Reproductive: Uterus and bilateral adnexa are unremarkable. Other: No abdominal wall hernia or abnormality. Mild ascites in the pelvis. Musculoskeletal: No acute or significant osseous findings. IMPRESSION: 1. Borderline hepatomegaly with diffuse patchy low-attenuation concerning for acute inflammation such as hepatitis. 2. Gallbladder wall thickening with pericholecystic fluid, likely reactive.  No gallstones identified. 3. Small right pleural effusion. 4. Mild ascites in the pelvis, could be reactive versus physiologic. Electronically Signed   By: Emmaline Kluver M.D.   On: 04/04/2020 10:19   DG CHEST PORT 1 VIEW  Result Date: 04/14/2020 CLINICAL DATA:  Acute respiratory failure EXAM: PORTABLE CHEST 1 VIEW COMPARISON:  April 13, 2020 FINDINGS: The ETT remains in good position. The NG tube terminates below today's film, advanced several cm in the interval. No pneumothorax. The cardiomediastinal silhouette is stable. No nodules or masses. No focal infiltrates. IMPRESSION: Stable ET tube. The NG tube terminates below today's film, advanced several  cm in the interval. No other abnormalities. Electronically Signed   By: Gerome Sam III M.D   On: 04/14/2020 07:10   DG Chest Port 1 View  Result Date: 04/13/2020 CLINICAL DATA:  Aspiration EXAM: ABDOMEN - 1 VIEW; PORTABLE CHEST - 1 VIEW COMPARISON:  Radiograph 04/12/2020 FINDINGS: Transesophageal tube tip in the left upper quadrant, side port positioned at the level of the GE junction. Consider advancing 2-3 cm for optimal positioning. Endotracheal tube tip terminates in the mid trachea, 3.6 cm from the carina. External support devices and telemetry leads overlie the chest. Left antecubital IV cannula noted. Few streaky opacities are present in the right lung base with low lung volumes possibly reflecting atelectasis though sequela of aspiration is not excluded. No pneumothorax. No visible effusion. Stable cardiomediastinal contours. No acute osseous or soft tissue abnormality. IMPRESSION: 1. Transesophageal tube tip in the left upper quadrant, side port positioned at the level of the GE junction. Consider advancing 2-3 cm for optimal positioning. 2. Satisfactory positioning of the endotracheal tube. 3. Low lung volumes with streaky opacities in the right base which could reflect atelectasis or features of aspiration or early airspace disease.  Electronically Signed   By: Kreg Shropshire M.D.   On: 04/13/2020 02:26   DG CHEST PORT 1 VIEW  Result Date: 04/12/2020 CLINICAL DATA:  Acute respiratory failure with hypoxia. EXAM: PORTABLE CHEST 1 VIEW COMPARISON:  04/11/2020. FINDINGS: Endotracheal tube tip just above the carina. Proximal repositioning of 1-2 cm should be considered. Right IJ line and NG tube in stable position. Heart size normal. Low lung volumes. Mild right base infiltrate cannot be excluded. No pleural effusion or pneumothorax. IMPRESSION: 1. Endotracheal tube tip just above the carina. Proximal repositioning of 1-2 cm should be considered. 2.  Right IJ line NG tube in stable position. 3. Low lung volumes. Mild right base infiltrate cannot be excluded. Electronically Signed   By: Maisie Fus  Register   On: 04/12/2020 12:57   DG CHEST PORT 1 VIEW  Result Date: 04/11/2020 CLINICAL DATA:  Hypoxia EXAM: PORTABLE CHEST 1 VIEW COMPARISON:  April 07, 2020 FINDINGS: Endotracheal tube tip is 4.0 cm above the carina. Nasogastric tube tip and side port are below the diaphragm. Central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

## 2020-06-06 ENCOUNTER — Ambulatory Visit (INDEPENDENT_AMBULATORY_CARE_PROVIDER_SITE_OTHER): Payer: Self-pay | Admitting: Nurse Practitioner

## 2020-06-06 ENCOUNTER — Other Ambulatory Visit (INDEPENDENT_AMBULATORY_CARE_PROVIDER_SITE_OTHER): Payer: Self-pay

## 2020-06-06 ENCOUNTER — Encounter: Payer: Self-pay | Admitting: Nurse Practitioner

## 2020-06-06 VITALS — BP 100/70 | HR 88 | Ht 66.0 in | Wt 143.1 lb

## 2020-06-06 DIAGNOSIS — K7200 Acute and subacute hepatic failure without coma: Secondary | ICD-10-CM

## 2020-06-06 LAB — COMPREHENSIVE METABOLIC PANEL
ALT: 13 U/L (ref 0–35)
AST: 22 U/L (ref 0–37)
Albumin: 3.6 g/dL (ref 3.5–5.2)
Alkaline Phosphatase: 69 U/L (ref 39–117)
BUN: 6 mg/dL (ref 6–23)
CO2: 29 mEq/L (ref 19–32)
Calcium: 9.6 mg/dL (ref 8.4–10.5)
Chloride: 102 mEq/L (ref 96–112)
Creatinine, Ser: 0.49 mg/dL (ref 0.40–1.20)
GFR: 130.99 mL/min (ref >60.00)
Glucose, Bld: 83 mg/dL (ref 70–99)
Potassium: 3.5 mEq/L (ref 3.5–5.1)
Sodium: 137 mEq/L (ref 135–145)
Total Bilirubin: 2.2 mg/dL — ABNORMAL HIGH (ref 0.2–1.2)
Total Protein: 7 g/dL (ref 6.0–8.3)

## 2020-06-06 LAB — CBC
HCT: 36.2 % (ref 36.0–46.0)
Hemoglobin: 11.9 g/dL — ABNORMAL LOW (ref 12.0–15.0)
MCHC: 32.9 g/dL (ref 30.0–36.0)
MCV: 90.6 fl (ref 78.0–100.0)
Platelets: 334 10*3/uL (ref 150.0–400.0)
RBC: 3.99 Mil/uL (ref 3.87–5.11)
RDW: 14.1 % (ref 11.5–15.5)
WBC: 9.1 10*3/uL (ref 4.0–10.5)

## 2020-06-06 LAB — PROTIME-INR
INR: 1.2 ratio — ABNORMAL HIGH (ref 0.8–1.0)
Prothrombin Time: 12.9 s (ref 9.6–13.1)

## 2020-06-06 NOTE — Patient Instructions (Signed)
Your provider has requested that you go to the basement level for lab work before leaving today. Press "B" on the elevator. The lab is located at the first door on the left as you exit the elevator.  
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
## 2020-06-08 ENCOUNTER — Encounter: Payer: Self-pay | Admitting: Nurse Practitioner

## 2020-06-08 LAB — PREGNANCY, URINE: Preg Test, Ur: NEGATIVE

## 2020-06-10 NOTE — Progress Notes (Signed)
Attending Physician's Attestation   I have reviewed the chart.   I agree with the Advanced Practitioner's note, impression, and recommendations with any updates as below. This is fantastic to hear.  She was extremely sick and seems to have made a transition/change in her life.  I am hopeful she is able to maintain this as is described.  Continues on low-dose diuretics as per NP Wilmon Pali and follow-up electrolytes in the near future   Corliss Parish, MD [facility] Advanced Endoscopy Office # [phone]

## 2020-06-11 ENCOUNTER — Other Ambulatory Visit: Payer: Self-pay

## 2020-06-11 DIAGNOSIS — K7200 Acute and subacute hepatic failure without coma: Secondary | ICD-10-CM

## 2020-06-11 MED ORDER — SPIRONOLACTONE 25 MG PO TABS: 25.0000 mg | ORAL_TABLET | Freq: Every day | ORAL | 2 refills | Status: DC

## 2020-06-26 ENCOUNTER — Other Ambulatory Visit (INDEPENDENT_AMBULATORY_CARE_PROVIDER_SITE_OTHER): Payer: Self-pay

## 2020-06-26 DIAGNOSIS — K7200 Acute and subacute hepatic failure without coma: Secondary | ICD-10-CM

## 2020-06-26 LAB — HEPATIC FUNCTION PANEL
ALT: 22 U/L (ref 0–35)
AST: 32 U/L (ref 0–37)
Albumin: 4.2 g/dL (ref 3.5–5.2)
Alkaline Phosphatase: 97 U/L (ref 39–117)
Bilirubin, Direct: 0.4 mg/dL — ABNORMAL HIGH (ref 0.0–0.3)
Total Bilirubin: 1 mg/dL (ref 0.2–1.2)
Total Protein: 7.9 g/dL (ref 6.0–8.3)

## 2020-06-26 LAB — BASIC METABOLIC PANEL
BUN: 10 mg/dL (ref 6–23)
CO2: 26 mEq/L (ref 19–32)
Calcium: 9.8 mg/dL (ref 8.4–10.5)
Chloride: 101 mEq/L (ref 96–112)
Creatinine, Ser: 0.66 mg/dL (ref 0.40–1.20)
GFR: 118.24 mL/min (ref >60.00)
Glucose, Bld: 93 mg/dL (ref 70–99)
Potassium: 3.7 mEq/L (ref 3.5–5.1)
Sodium: 135 mEq/L (ref 135–145)

## 2020-08-10 ENCOUNTER — Ambulatory Visit: Payer: Self-pay | Admitting: Gastroenterology

## 2020-10-02 ENCOUNTER — Encounter: Payer: Self-pay | Admitting: Gastroenterology

## 2020-10-02 ENCOUNTER — Ambulatory Visit (INDEPENDENT_AMBULATORY_CARE_PROVIDER_SITE_OTHER): Payer: Self-pay | Admitting: Gastroenterology

## 2020-10-02 ENCOUNTER — Other Ambulatory Visit (INDEPENDENT_AMBULATORY_CARE_PROVIDER_SITE_OTHER): Payer: Self-pay

## 2020-10-02 VITALS — BP 110/72 | HR 73 | Ht 66.0 in | Wt 153.2 lb

## 2020-10-02 DIAGNOSIS — K7200 Acute and subacute hepatic failure without coma: Secondary | ICD-10-CM

## 2020-10-02 DIAGNOSIS — Z862 Personal history of diseases of the blood and blood-forming organs and certain disorders involving the immune mechanism: Secondary | ICD-10-CM

## 2020-10-02 DIAGNOSIS — K7201 Acute and subacute hepatic failure with coma: Secondary | ICD-10-CM

## 2020-10-02 DIAGNOSIS — R0602 Shortness of breath: Secondary | ICD-10-CM

## 2020-10-02 DIAGNOSIS — F1011 Alcohol abuse, in remission: Secondary | ICD-10-CM

## 2020-10-02 LAB — COMPREHENSIVE METABOLIC PANEL
ALT: 38 U/L — ABNORMAL HIGH (ref 0–35)
AST: 29 U/L (ref 0–37)
Albumin: 4.1 g/dL (ref 3.5–5.2)
Alkaline Phosphatase: 92 U/L (ref 39–117)
BUN: 13 mg/dL (ref 6–23)
CO2: 30 mEq/L (ref 19–32)
Calcium: 9.5 mg/dL (ref 8.4–10.5)
Chloride: 103 mEq/L (ref 96–112)
Creatinine, Ser: 0.82 mg/dL (ref 0.40–1.20)
GFR: 96.24 mL/min (ref >60.00)
Glucose, Bld: 76 mg/dL (ref 70–99)
Potassium: 3.8 mEq/L (ref 3.5–5.1)
Sodium: 136 mEq/L (ref 135–145)
Total Bilirubin: 0.3 mg/dL (ref 0.2–1.2)
Total Protein: 7.5 g/dL (ref 6.0–8.3)

## 2020-10-02 LAB — CBC
HCT: 35.9 % — ABNORMAL LOW (ref 36.0–46.0)
Hemoglobin: 11.7 g/dL — ABNORMAL LOW (ref 12.0–15.0)
MCHC: 32.6 g/dL (ref 30.0–36.0)
MCV: 86.7 fl (ref 78.0–100.0)
Platelets: 308 10*3/uL (ref 150.0–400.0)
RBC: 4.14 Mil/uL (ref 3.87–5.11)
RDW: 13.8 % (ref 11.5–15.5)
WBC: 5.7 10*3/uL (ref 4.0–10.5)

## 2020-10-02 LAB — IBC + FERRITIN
Ferritin: 13.3 ng/mL (ref 10.0–291.0)
Iron: 54 ug/dL (ref 42–145)
Saturation Ratios: 12.6 % — ABNORMAL LOW (ref 20.0–50.0)
Transferrin: 306 mg/dL (ref 212.0–360.0)

## 2020-10-02 LAB — PROTIME-INR
INR: 1.1 ratio — ABNORMAL HIGH (ref 0.8–1.0)
Prothrombin Time: 12.3 s (ref 9.6–13.1)

## 2020-10-02 LAB — VITAMIN B12: Vitamin B-12: 496 pg/mL (ref 211–911)

## 2020-10-02 LAB — TSH: TSH: 1.08 u[IU]/mL (ref 0.35–4.50)

## 2020-10-02 LAB — FOLATE: Folate: 17.2 ng/mL (ref >5.9)

## 2020-10-02 NOTE — Patient Instructions (Addendum)
STOP: Spironolactone  Continue MVI, Thiamine, and Folate.  If you are age 30 or younger, your body mass index should be between 19-25. Your Body mass index is 24.73 kg/m. If this is out of the aformentioned range listed, please consider follow up with your Primary Care Provider.    You have been scheduled for an abdominal ultrasound at [facility] (1st floor of hospital) on 10/10/20 at 8:00am. Please arrive 15 minutes prior to your appointment for registration. Make certain not to have anything to eat or drink 6 hours prior to your appointment. Should you need to reschedule your appointment, please contact radiology at [phone]. This test typically takes about 30 minutes to perform.  Your provider has requested that you go to the basement level for lab work before leaving today. Press "B" on the elevator. The lab is located at the first door on the left as you exit the elevator.  Due to recent changes in healthcare laws, you may see the results of your imaging and laboratory studies on MyChart before your provider has had a chance to review them.  We understand that in some cases there may be results that are confusing or concerning to you. Not all laboratory results come back in the same time frame and the provider may be waiting for multiple results in order to interpret others.  Please give [place] 48 hours in order for your provider to thoroughly review all the results before contacting the office for clarification of your results.    You have been referred to [facility]- Dr. Lawerance Bach and [facility]. Their office will contact you for an appointment. If you have not heard from their offices in 1- 2 weeks, please let us know.   You will need office follow up in 2 months. Office will call with an appointment.   Thank you for choosing me and [facility].  Dr. Meridee Score

## 2020-10-03 LAB — HEPATITIS B SURFACE ANTIGEN: Hepatitis B Surface Ag: NONREACTIVE

## 2020-10-03 LAB — HEPATITIS B CORE ANTIBODY, TOTAL: Hep B Core Total Ab: NONREACTIVE

## 2020-10-03 LAB — HEPATITIS A ANTIBODY, TOTAL: Hepatitis A AB,Total: NONREACTIVE

## 2020-10-05 ENCOUNTER — Encounter: Payer: Self-pay | Admitting: Gastroenterology

## 2020-10-05 DIAGNOSIS — R0602 Shortness of breath: Secondary | ICD-10-CM | POA: Insufficient documentation

## 2020-10-05 DIAGNOSIS — Z862 Personal history of diseases of the blood and blood-forming organs and certain disorders involving the immune mechanism: Secondary | ICD-10-CM | POA: Insufficient documentation

## 2020-10-05 HISTORY — DX: Shortness of breath: R06.02
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
## 2020-10-08 ENCOUNTER — Telehealth: Payer: Self-pay | Admitting: Gastroenterology

## 2020-10-08 ENCOUNTER — Other Ambulatory Visit: Payer: Self-pay

## 2020-10-08 DIAGNOSIS — Z862 Personal history of diseases of the blood and blood-forming organs and certain disorders involving the immune mechanism: Secondary | ICD-10-CM

## 2020-10-08 DIAGNOSIS — K7200 Acute and subacute hepatic failure without coma: Secondary | ICD-10-CM

## 2020-10-08 DIAGNOSIS — K759 Inflammatory liver disease, unspecified: Secondary | ICD-10-CM

## 2020-10-08 DIAGNOSIS — F1011 Alcohol abuse, in remission: Secondary | ICD-10-CM

## 2020-10-08 NOTE — Telephone Encounter (Signed)
The pt has been advised that the [place] is to prevent her from getting Hep A or B.  The pt has been advised of the information and verbalized understanding.

## 2020-10-08 NOTE — Telephone Encounter (Signed)
Pt is requesting a call back from a nurse to know what the HEP injections are for exactly.

## 2020-10-10 ENCOUNTER — Other Ambulatory Visit: Payer: Self-pay

## 2020-10-10 ENCOUNTER — Ambulatory Visit (HOSPITAL_COMMUNITY)
Admission: RE | Admit: 2020-10-10 | Discharge: 2020-10-10 | Disposition: A | Discharge status: Home / Self Care | Payer: Medicaid Other | Source: Ambulatory Visit | Attending: Gastroenterology | Admitting: Gastroenterology

## 2020-10-10 DIAGNOSIS — K7201 Acute and subacute hepatic failure with coma: Secondary | ICD-10-CM | POA: Insufficient documentation

## 2020-10-10 DIAGNOSIS — R0602 Shortness of breath: Secondary | ICD-10-CM | POA: Insufficient documentation

## 2020-10-10 DIAGNOSIS — Z862 Personal history of diseases of the blood and blood-forming organs and certain disorders involving the immune mechanism: Secondary | ICD-10-CM | POA: Diagnosis present

## 2020-10-17 ENCOUNTER — Ambulatory Visit (INDEPENDENT_AMBULATORY_CARE_PROVIDER_SITE_OTHER): Payer: Self-pay | Admitting: Gastroenterology

## 2020-10-17 ENCOUNTER — Other Ambulatory Visit: Payer: Self-pay

## 2020-10-17 DIAGNOSIS — Z23 Encounter for immunization: Secondary | ICD-10-CM

## 2020-11-02 ENCOUNTER — Encounter: Payer: Self-pay | Admitting: Pulmonary Disease

## 2020-11-02 ENCOUNTER — Other Ambulatory Visit: Payer: Self-pay

## 2020-11-02 ENCOUNTER — Ambulatory Visit (INDEPENDENT_AMBULATORY_CARE_PROVIDER_SITE_OTHER): Payer: Medicaid Other | Admitting: Pulmonary Disease

## 2020-11-02 ENCOUNTER — Ambulatory Visit (INDEPENDENT_AMBULATORY_CARE_PROVIDER_SITE_OTHER): Payer: Medicaid Other

## 2020-11-02 VITALS — BP 112/62 | HR 83 | Temp 97.1°F | Ht 66.0 in | Wt 154.4 lb

## 2020-11-02 DIAGNOSIS — R0602 Shortness of breath: Secondary | ICD-10-CM | POA: Diagnosis not present

## 2020-11-02 LAB — CBC WITH DIFFERENTIAL/PLATELET
Basophils Absolute: 0.1 K/uL (ref 0.0–0.1)
Basophils Relative: 0.9 % (ref 0.0–3.0)
Eosinophils Absolute: 0.1 K/uL (ref 0.0–0.7)
Eosinophils Relative: 0.8 % (ref 0.0–5.0)
HCT: 37.4 % (ref 36.0–46.0)
Hemoglobin: 12.3 g/dL (ref 12.0–15.0)
Lymphocytes Relative: 23.4 % (ref 12.0–46.0)
Lymphs Abs: 1.6 K/uL (ref 0.7–4.0)
MCHC: 33 g/dL (ref 30.0–36.0)
MCV: 86 fl (ref 78.0–100.0)
Monocytes Absolute: 0.4 K/uL (ref 0.1–1.0)
Monocytes Relative: 6.7 % (ref 3.0–12.0)
Neutro Abs: 4.6 K/uL (ref 1.4–7.7)
Neutrophils Relative %: 68.2 % (ref 43.0–77.0)
Platelets: 318 K/uL (ref 150.0–400.0)
RBC: 4.35 Mil/uL (ref 3.87–5.11)
RDW: 13.7 % (ref 11.5–15.5)
WBC: 6.7 K/uL (ref 4.0–10.5)
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
## 2020-11-02 NOTE — Patient Instructions (Signed)
Check some labs today including CBC with differential, IgE, D-dimer  Chest x-ray and PFTs We will order an albuterol inhaler to be used as needed  Follow-up in clinic after PFTs.

## 2020-11-05 LAB — D-DIMER, QUANTITATIVE: D-Dimer, Quant: 0.27 mcg/mL FEU (ref <0.50)

## 2020-11-05 LAB — IGE: IgE (Immunoglobulin E), Serum: 63 kU/L (ref <=114)

## 2020-11-21 ENCOUNTER — Encounter: Payer: Self-pay | Admitting: *Deleted

## 2020-12-05 ENCOUNTER — Ambulatory Visit: Payer: Self-pay | Admitting: Internal Medicine

## 2020-12-11 ENCOUNTER — Other Ambulatory Visit (HOSPITAL_COMMUNITY): Payer: Medicaid Other

## 2020-12-13 ENCOUNTER — Telehealth: Payer: Self-pay | Admitting: Pulmonary Disease

## 2020-12-13 NOTE — Telephone Encounter (Signed)
ATC pt to see if Covid test had been completed and if so notify her she must bring printed results with her to PFT appointment. LVMTCB. Front staff if pt calls back please ask about testing and if not done please cancel PFT on 12/14/20 and reschedule with Covid test appointment 3 days prior to PFT.

## 2020-12-14 ENCOUNTER — Ambulatory Visit: Payer: Medicaid Other | Admitting: Pulmonary Disease

## 2021-01-25 ENCOUNTER — Other Ambulatory Visit: Payer: Self-pay

## 2021-01-25 ENCOUNTER — Ambulatory Visit (HOSPITAL_COMMUNITY)
Admission: EM | Admit: 2021-01-25 | Discharge: 2021-01-25 | Disposition: A | Discharge status: Home / Self Care | Payer: Medicaid Other | Attending: Family | Admitting: Family

## 2021-01-25 DIAGNOSIS — F3170 Bipolar disorder, currently in remission, most recent episode unspecified: Secondary | ICD-10-CM | POA: Insufficient documentation

## 2021-01-25 MED ORDER — QUETIAPINE FUMARATE 50 MG PO TABS: 50.0000 mg | ORAL_TABLET | Freq: Every day | ORAL | 0 refills | Status: DC

## 2021-01-25 NOTE — ED Provider Notes (Addendum)
Behavioral Health Urgent Care Medical Screening Exam  [name] MRN: [id number] Date of Evaluation: 01/25/21 Chief Complaint:   Diagnosis:  Final diagnoses:  Bipolar disorder in full remission, most recent episode unspecified type [facility])    History of Present illness: Gina Hooper is a 30 y.o. female.  Patient presents voluntarily to [facility] behavioral health for walk-in assessment.  She requests that her fianc, Gina Hooper remain present during assessment.  She reports "I have been off of my medication since last March (2021), I used to go to [place], I came here today to get back on my medications."  Gina Hooper denies current stressors.  She reports she is happy that she has changed her life.  She endorses a history of alcohol and substance use disorder.  Reports last use of alcohol or substance was in March 2022.  She reports "I am just getting everything back in place, going back to school and staying focused, I want to get back on the medications."  She reports she is going through a "positive change in life."  She has been diagnosed with bipolar disorder.  She reports she has not had any medications for over 1 year.  She is unable to recall medications.  She would like to establish follow-up with [facility] behavioral health.  Gina Hooper is assessed by nurse practitioner.  She is alert and oriented, answers appropriately.  She is pleasant and cooperative during assessment.  She is insightful and goal oriented.  She denies suicidal and homicidal ideations.  She denies any history of self harm, denies any history of suicide attempts.  She denies auditory and visual hallucinations.  There is no evidence of delusional thought content she denies symptoms of paranoia.  She contracts verbally for safety with this Clinical research associate.  Gina Hooper resides in [place] with her fianc. She has one child.  She denies access to weapons.  She is currently not employed.  She  denies alcohol and substance use currently.  She endorses "good" appetite.  She reports decreased sleep.  Patient offered support and encouragement.  She gives verbal consent to speak with Gina Hooper. [name] reports he has noticed patient "staring off" at times, attributes this to lack of current medications.  He denies concern for patient safety.    Psychiatric Specialty Exam  Presentation  General Appearance:Appropriate for Environment; Casual  Eye Contact:Good  Speech:Clear and Coherent; Normal Rate  Speech Volume:Normal  Handedness:Right   Mood and Affect  Mood:Euthymic  Affect:Appropriate; Congruent   Thought Process  Thought Processes:Coherent; Goal Directed  Descriptions of Associations:Intact  Orientation:Full (Time, Place and Person)  Thought Content:Logical; WDL    Hallucinations:None  Ideas of Reference:None  Suicidal Thoughts:No  Homicidal Thoughts:No   Sensorium  Memory:Immediate Fair; Recent Fair; Remote Fair  Judgment:Good  Insight:Good   Executive Functions  Concentration:Good  Attention Span:Good  Recall:Good  Fund of Knowledge:Good  Language:Good   Psychomotor Activity  Psychomotor Activity:Normal   Assets  Assets:Communication Skills; Desire for Improvement; Housing; Intimacy; Leisure Time; Health and safety inspector; Resilience; Social Support   Sleep  Sleep:Fair  Number of hours: No data recorded  No data recorded  Physical Exam: Physical Exam Vitals and nursing note reviewed.  Constitutional:      Appearance: Normal appearance. She is well-developed and normal weight.  HENT:     Head: Normocephalic and atraumatic.     Nose: Nose normal.  Cardiovascular:     Rate and Rhythm: Normal rate.  Pulmonary:     Effort:  Pulmonary effort is normal.  Musculoskeletal:        General: Normal range of motion.     Cervical back: Normal range of motion.  Neurological:     Mental Status: She is  alert and oriented to person, place, and time.  Psychiatric:        Attention and Perception: Attention and perception normal.        Mood and Affect: Mood and affect normal.        Speech: Speech normal.        Behavior: Behavior normal. Behavior is cooperative.        Thought Content: Thought content normal.        Cognition and Memory: Cognition and memory normal.        Judgment: Judgment normal.    Review of Systems  Constitutional: Negative.   HENT: Negative.   Eyes: Negative.   Respiratory: Negative.   Cardiovascular: Negative.   Gastrointestinal: Negative.   Genitourinary: Negative.   Musculoskeletal: Negative.   Skin: Negative.   Neurological: Negative.   Endo/Heme/Allergies: Negative.   Psychiatric/Behavioral: Negative.    Blood pressure (!) 148/99, pulse 96, temperature 98.9 F (37.2 C), temperature source Oral, resp. rate 20, height 5\' 6"  (1.676 m), weight 149 lb (67.6 kg), SpO2 100 %. Body mass index is 24.05 kg/m.  Musculoskeletal: Strength & Muscle Tone: within normal limits Gait & Station: normal Patient leans: N/A   [facility] MSE Discharge Disposition for Follow up and Recommendations: Based on my evaluation the patient does not appear to have an emergency medical condition and can be discharged with resources and follow up care in outpatient services for Medication Management and Individual Therapy  Patient reviewed with Dr. . Follow-up with outpatient psychiatry at [facility] behavioral health. Medications:  -Seroquel 50 mg nightly   Discussed Seroquel, risk and benefits.  Patient reports she has taken this medication in the past.  Verbalizes understanding, no further questions.  [facility], FNP 01/25/2021, 3:53 PM

## 2021-01-25 NOTE — Discharge Instructions (Addendum)
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
## 2021-02-27 ENCOUNTER — Ambulatory Visit (HOSPITAL_COMMUNITY)
Admission: EM | Admit: 2021-02-27 | Discharge: 2021-02-28 | Disposition: A | Discharge status: Other Acute Inpatient Hospital | Payer: Medicaid Other | Attending: Student | Admitting: Student

## 2021-02-27 ENCOUNTER — Other Ambulatory Visit: Payer: Self-pay

## 2021-02-27 DIAGNOSIS — Z20822 Contact with and (suspected) exposure to covid-19: Secondary | ICD-10-CM | POA: Insufficient documentation

## 2021-02-27 DIAGNOSIS — F3164 Bipolar disorder, current episode mixed, severe, with psychotic features: Secondary | ICD-10-CM | POA: Insufficient documentation

## 2021-02-27 DIAGNOSIS — F1020 Alcohol dependence, uncomplicated: Secondary | ICD-10-CM | POA: Diagnosis not present

## 2021-02-27 DIAGNOSIS — F17210 Nicotine dependence, cigarettes, uncomplicated: Secondary | ICD-10-CM | POA: Diagnosis not present

## 2021-02-27 DIAGNOSIS — F319 Bipolar disorder, unspecified: Secondary | ICD-10-CM | POA: Insufficient documentation

## 2021-02-27 MED ORDER — PROPRANOLOL HCL 10 MG PO TABS
10.0000 mg | ORAL_TABLET | Freq: Two times a day (BID) | ORAL | Status: DC
  Administered 2021-02-28: 10 mg via ORAL
  Filled 2021-02-27: qty 1

## 2021-02-27 MED ORDER — TRAZODONE HCL 50 MG PO TABS: 50.0000 mg | ORAL_TABLET | Freq: Every evening | ORAL | Status: DC | PRN

## 2021-02-27 MED ORDER — ALUM & MAG HYDROXIDE-SIMETH 200-200-20 MG/5ML PO SUSP: 30.0000 mL | ORAL | Status: DC | PRN

## 2021-02-27 MED ORDER — MAGNESIUM HYDROXIDE 400 MG/5ML PO SUSP: 30.0000 mL | Freq: Every day | ORAL | Status: DC | PRN

## 2021-02-27 MED ORDER — ACETAMINOPHEN 325 MG PO TABS: 650.0000 mg | ORAL_TABLET | Freq: Four times a day (QID) | ORAL | Status: DC | PRN

## 2021-02-27 MED ORDER — HYDROXYZINE HCL 25 MG PO TABS
25.0000 mg | ORAL_TABLET | Freq: Three times a day (TID) | ORAL | Status: DC | PRN
  Administered 2021-02-28 (×2): 25 mg via ORAL
  Filled 2021-02-27 (×2): qty 1

## 2021-02-27 MED ORDER — QUETIAPINE FUMARATE 50 MG PO TABS
50.0000 mg | ORAL_TABLET | Freq: Every day | ORAL | Status: DC
  Administered 2021-02-28: 50 mg via ORAL
  Filled 2021-02-27: qty 1

## 2021-02-27 MED ORDER — ALBUTEROL SULFATE HFA 108 (90 BASE) MCG/ACT IN AERS: 2.0000 | INHALATION_SPRAY | Freq: Four times a day (QID) | RESPIRATORY_TRACT | Status: DC | PRN
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
## 2021-02-27 NOTE — ED Provider Notes (Addendum)
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 Final   Comment: . The D-Dimer test is used frequently to exclude an acute PE or DVT. In patients with a low to moderate clinical risk assessment and a D-Dimer result <0.50 mcg/mL FEU, the likelihood of a PE or DVT is very low. However, a thromboembolic event should not be excluded solely on the basis of the D-Dimer level. Increased levels of D-Dimer are associated with a PE, DVT, DIC, malignancies, inflammation, sepsis, surgery, trauma, pregnancy, and advancing patient age. [[place]ama 2006 11:295(2):199-207] . For additional information, please refer  to: http://education.questdiagnostics.com/faq/FAQ149 (This link is being provided for informational/ educational purposes only) .    IgE (Immunoglobulin E), Serum 11/02/2020 63  <OR=114 kU/L Final   WBC 11/02/2020 6.7  4.0 - 10.5 K/uL Final   RBC 11/02/2020 4.35  3.87 - 5.11 Mil/uL Final   Hemoglobin 11/02/2020 12.3  12.0 - 15.0 g/dL Final   HCT 69/62/9528 37.4  36.0 - 46.0 % Final   MCV 11/02/2020 86.0  78.0 - 100.0 fl Final   MCHC 11/02/2020 33.0  30.0 - 36.0 g/dL Final   RDW 41/32/4401 13.7  11.5 - 15.5 % Final   Platelets 11/02/2020 318.0  150.0 - 400.0 K/uL Final   Neutrophils Relative % 11/02/2020 68.2  43.0 - 77.0 % Final   Lymphocytes Relative 11/02/2020 23.4  12.0 - 46.0 % Final   Monocytes Relative 11/02/2020 6.7  3.0 - 12.0 % Final   Eosinophils Relative 11/02/2020 0.8  0.0 - 5.0 % Final   Basophils Relative 11/02/2020 0.9  0.0 - 3.0 % Final   Neutro Abs 11/02/2020 4.6  1.4 - 7.7 K/uL Final   Lymphs Abs 11/02/2020 1.6  0.7 - 4.0 K/uL Final   Monocytes Absolute 11/02/2020 0.4  0.1 - 1.0 K/uL Final   Eosinophils Absolute 11/02/2020 0.1  0.0 - 0.7 K/uL Final   Basophils Absolute 11/02/2020 0.1  0.0 - 0.1 K/uL Final  Appointment on 10/02/2020  Component Date Value Ref  Range Status   Hep B Core Total Ab 10/02/2020 NON-REACTIVE  NON-REACTI Final   Hepatitis B Surface Ag 10/02/2020 NON-REACTIVE  NON-REACTI Final   Hepatitis A AB,Total 10/02/2020 NON-REACTIVE  NON-REACTI Final   Comment: . For additional information, please refer to  http://education.questdiagnostics.com/faq/FAQ202  (This link is being provided for informational/ educational purposes only.) .    INR 10/02/2020 1.1 (A) 0.8 - 1.0 ratio Final   Prothrombin Time 10/02/2020 12.3  9.6 - 13.1 sec Final   Vitamin B-12 10/02/2020 496  211 - 911 pg/mL Final   Iron 10/02/2020 54  42 - 145 ug/dL Final   Transferrin 86/57/8469 306.0  212.0 - 360.0 mg/dL Final   Saturation Ratios 10/02/2020 12.6 (A) 20.0 - 50.0 % Final    Ferritin 10/02/2020 13.3  10.0 - 291.0 ng/mL Final   Sodium 10/02/2020 136  135 - 145 mEq/L Final   Potassium 10/02/2020 3.8  3.5 - 5.1 mEq/L Final   Chloride 10/02/2020 103  96 - 112 mEq/L Final   CO2 10/02/2020 30  19 - 32 mEq/L Final   Glucose, Bld 10/02/2020 76  70 - 99 mg/dL Final   BUN 62/95/2841 13  6 - 23 mg/dL Final   Creatinine, Ser 10/02/2020 0.82  0.40 - 1.20 mg/dL Final   Total Bilirubin 10/02/2020 0.3  0.2 - 1.2 mg/dL Final   Alkaline Phosphatase 10/02/2020 92  39 - 117 U/L Final   AST 10/02/2020 29  0 - 37 U/L Final   ALT 10/02/2020 38 (A) 0 - 35 U/L Final   Total Protein 10/02/2020 7.5  6.0 - 8.3 g/dL Final   Albumin 32/44/0102 4.1  3.5 - 5.2 g/dL Final   GFR 72/53/6644 96.24  >60.00 mL/min Final   Calculated using the CKD-EPI Creatinine Equation (2021)   Calcium 10/02/2020 9.5  8.4 - 10.5 mg/dL Final   WBC 03/47/4259 5.7  4.0 - 10.5 K/uL Final   RBC 10/02/2020 4.14  3.87 - 5.11 Mil/uL Final   Platelets 10/02/2020 308.0  150.0 - 400.0 K/uL Final   Hemoglobin 10/02/2020 11.7 (A) 12.0 - 15.0 g/dL Final   HCT 56/38/7564 35.9 (A) 36.0 - 46.0 % Final   MCV 10/02/2020 86.7  78.0 - 100.0 fl Final   MCHC 10/02/2020 32.6  30.0 - 36.0 g/dL Final   RDW 33/29/5188 13.8  11.5 - 15.5 % Final   TSH 10/02/2020 1.08  0.35 - 4.50 uIU/mL Final   Folate 10/02/2020 17.2  >5.9 ng/mL Final    Allergies: Patient has no known allergies.  PTA Medications: (Not in a hospital admission)   Medical Decision Making  Patient is a 30 year old female with past medical history of alcohol use disorder/dependence, hepatitis, and acute liver failure, as well as past psychiatric history significant for bipolar disorder, who presents to [facility] therapy with her fianc for bizarre behaviors and recently displayed by the patient, as well as the patient being off of her Seroquel.  Based on patient's current presentation and obtained collateral information from patient's fianc, patient appears to be  exhibiting concerning behaviors, patient appears to be experiencing psychosis, exhibiting some manic/strange behaviors, and appearing to be delusional ( I.e. patient stating that she is pregnant and is not actually pregnant-urine pregnancy test is negative).  Thus, based on patient's presentation, obtained information from patient's fianc, and my personal assessment of the patient, recommend continuous assessment for the patient at this time.  Patient and her fianc are agreeable to continuous assessment.    Recommendations  Based  on my evaluation the patient does not appear to have an emergency medical condition. Patient will be placed in [facility] continues assessment for further stabilization and treatment.  Patient will be reevaluated by the treatment team on 02/28/2021 Disposition to be determined at that time.  Based on patient's current presentation, it is possible that patient may need inpatient psychiatric treatment upon 02/28/2021 treatment team reevaluation.  Labs/tests ordered and reviewed:  -Upon evaluation of CMP: Potassium decreased at 2.8 mmol/L.  Patient seen and examined by myself on the [facility] adult flex unit.  Patient has been sleeping/resting comfortably and patient denies any headache, lightheadedness, dizziness, chest pain, palpitations, shortness of breath, abdominal pain, nausea, vomiting, or any additional physical symptoms at this time.  Vital signs rechecked and are reassuring: BP 100/66, temp 98.4 F, pulse 56 bpm (slightly bradycardic but not emergent at this time), respirations 16, SPO2 100%. EKG obtained, which shows slight sinus bradycardia (ventricular rate 59 bpm), but EKG is otherwise normal and EKG shows no acute/concerning findings, signs of arrhythmia, or signs of ischemia with PR interval of 166 ms, QRS 78 ms, and QT/QTC 414/409 ms).  Thus, based on patient's lack of symptoms, reassuring vital signs, and reassuring EKG, no ED transfer necessary at this time and patient is safe  to remain at [facility] at this time with initiation of p.o. potassium.  40 mg mEQ of potassium chloride p.o. ordered and given at 0432 on 02/28/21.  Second dose of potassium chloride 40 mEQ ordered to be given at 0830 on 02/28/2021 and serum potassium labs ordered to be drawn at 1100 on 02/28/2021 to recheck patient's potassium level.  -CMP is otherwise unremarkable.  Patient has history of liver failure.  Patient's LFTs are normal on CMP.  Thus, patient may take Tylenol as needed if necessary in the future.  -Urine pregnancy: Negative  -UDS: Positive for cocaine and marijuana  -PCR COVID: Negative  -CBC with differential: White blood cell count slightly elevated at 11.8 K/uL, hemoglobin slightly reduced at 11.4 g/dL, hematocrit slightly reduced at 34.6%, platelets slightly elevated at 439 K/uL.  Do not believe that the CBC abnormalities are emergent at this time.  CBC is otherwise unremarkable.   -Hemoglobin A1c, TSH, and lipid panel ordered due to patient's history of taking antipsychotic medications and to have on file for reinitiation of antipsychotic medications.  Hemoglobin A1c within normal limits.  Lipid panel shows LDL cholesterol elevated at 118 mg/dL.  Lipid panel is otherwise unremarkable.  TSH within normal limits.  -Serum potassium ordered to be drawn/checked on 11:00 AM on 02/28/2021 to recheck patient's potassium level after she received her second dose of oral potassium 40 mEq.  Believe that patient's intermittent twitching of bilateral eyes and mouth/lips is likely secondary to akathisia.  We will initiate propranolol 10 mg p.o. twice daily for akathisia.  Patient and patient's fianc are in agreement with initiating propranolol patient and patient's fianc educated on side effect profile of propranolol and the patient and patient's fianc verbalize understanding of this education.  Nursing staff to check patient's vitals, including blood pressure and pulse prior to administering patient's next dose of  propranolol on the morning of 02/28/2021 and night shift nursing staff to communicate this to dayshift nursing staff upon shift change.  Will restart the following home medication:  -Seroquel 50 mg p.o. daily at bedtime for bipolar disorder/psychosis  Trazodone 50 mg p.o. at bedtime as needed ordered for sleep. Vistaril 25 mg p.o. 3 times daily as needed ordered for  anxiety. Patient and patient's fianc educated on side effect profile of trazodone and Vistaril and patient and patient's fianc verbalized understanding and agreement of this education.  Albuterol inhaler: 2 puffs inhalation every 6 hours as needed ordered for wheezing/shortness of breath (patient denies shortness of breath currently, but per chart review patient has been evaluated for shortness of breath and has had albuterol as needed ordered for her in the past).     Gina ShaggyCody W Icelyn Navarrete, PA-C 02/28/21  4:50 AM

## 2021-02-27 NOTE — BH Assessment (Signed)
Comprehensive Clinical Assessment (CCA) Note  02/27/2021 Gina Hooper [id number]  Disposition: Per Melbourne Abts, PA-C, patient is recommended for overnight observation for safety and stabilization.   Flowsheet Row ED from 02/27/2021 in [facility]  C-SSRS RISK CATEGORY No Risk      The patient demonstrates the following risk factors for suicide: Chronic risk factors for suicide include: psychiatric disorder of bipolar disorder and substance use disorder. Acute risk factors for suicide include: N/A. Protective factors for this patient include: positive social support and responsibility to others (children, family). Considering these factors, the overall suicide risk at this point appears to be low. Patient is not appropriate for outpatient follow up.  Gina Hooper is a 30 year old female presenting to [facility] with her fianc reporting that patient has been without medications for about three days. Patient reporting symptoms of eyes and mouth twitching for the past four months, poor sleep for the past four days and non-command AH. Patient reports last hearing the voices yesterday and reports that the [facility] has been going on for a couple months. Patient reports hearing multiple voices "talking to me in my head", saying "everything that everyone is doing out here". Patient and fianc attempted to get medications from [facility] yesterday but was informed they needed evaluation before medications can be given. Pt has appt on 03/28/21 with [facility]. Gina Hooper reports that patient left their house around 4am this morning and was escorted back home by [employer] around 8am due to patient attempting to get in someone else house. Patient reports she was confused and did not know where she was. Fiance reports that patient recently made statements that "she is the lord and controls the world". Fiance also reports patient is doing odd things at home like putting hamburger meat in the sink, turing the water on  and allowing the water to overflow. Fiance reports walking into the home and witnessing patient standing at the sink looking at the sun and her eyes rolling and twitching. Patient does present with rapid eye movement and some psychomotor agitation.   Patient denies SI, HI, AVH and reports snorting OTC pain medications.  Patient reports pain in her back due to her believing that she is pregnant. Patient has a history of alcohol abuse per chart review however denies drinking any ETOH.   Chief Complaint:  Chief Complaint  Patient presents with   Medication Refill   Visit Diagnosis: Bipolar disorder, severe with psychotic features.     CCA Screening, Triage and Referral (STR)  Patient Reported Information How did you hear about [place]? Self  What Is the Reason for Your Visit/Call Today? Out of medications and eyes twitching  How Long Has This Been Causing You Problems? 1 wk - 1 month  What Do You Feel Would Help You the Most Today? Treatment for Depression or other mood problem   Have You Recently Had Any Thoughts About Hurting Yourself? No  Are You Planning to Commit Suicide/Harm Yourself At This time? No   Have you Recently Had Thoughts About Hurting Someone [name]? No  Are You Planning to Harm Someone at This Time? No  Explanation: No data recorded  Have You Used Any Alcohol or Drugs in the Past 24 Hours? Yes  How Long Ago Did You Use Drugs or Alcohol? No data recorded What Did You Use and How Much? Snorting OTC pain meds   Do You Currently Have a Therapist/Psychiatrist? No data recorded Name of Therapist/Psychiatrist: No data recorded  Have You Been  Recently Discharged From Any Public relations account executive or Programs? No data recorded Explanation of Discharge From Practice/Program: No data recorded    CCA Screening Triage Referral Assessment Type of Contact: No data recorded Telemedicine Service Delivery:   Is this Initial or Reassessment? No data recorded Date Telepsych consult  ordered in [facility]:  No data recorded Time Telepsych consult ordered in [facility]:  No data recorded Location of Assessment: No data recorded Provider Location: No data recorded  Collateral Involvement: No data recorded  Does Patient Have a Court Appointed Legal Guardian? No data recorded Name and Contact of Legal Guardian: No data recorded If Minor and Not Living with Parent(s), Who has Custody? No data recorded Is CPS involved or ever been involved? No data recorded Is APS involved or ever been involved? No data recorded  Patient Determined To Be At Risk for Harm To Self or Others Based on Review of Patient Reported Information or Presenting Complaint? No data recorded Method: No data recorded Availability of Means: No data recorded Intent: No data recorded Notification Required: No data recorded Additional Information for Danger to Others Potential: No data recorded Additional Comments for Danger to Others Potential: No data recorded Are There Guns or Other Weapons in Your Home? No data recorded Types of Guns/Weapons: No data recorded Are These Weapons Safely Secured?                            No data recorded Who Could Verify You Are Able To Have These Secured: No data recorded Do You Have any Outstanding Charges, Pending Court Dates, Parole/Probation? No data recorded Contacted To Inform of Risk of Harm To Self or Others: No data recorded   Does Patient Present under Involuntary Commitment? No data recorded IVC Papers Initial File Date: No data recorded  [place] of Residence: No data recorded  Patient Currently Receiving the Following Services: No data recorded  Determination of Need: Urgent (48 hours)   Options For Referral: Medication Management; Outpatient Therapy     CCA Biopsychosocial Patient Reported Schizophrenia/Schizoaffective Diagnosis in Past: No   Strengths: No data recorded  Mental Health Symptoms Depression:   Sleep (too much or little)   Duration of  Depressive symptoms:    Mania:   Change in energy/activity   Anxiety:    None   Psychosis:   Hallucinations   Duration of Psychotic symptoms:  Duration of Psychotic Symptoms: Less than six months   Trauma:   None   Obsessions:   None   Compulsions:   None   Inattention:   None   Hyperactivity/Impulsivity:   None   Oppositional/Defiant Behaviors:   None   Emotional Irregularity:   None   Other Mood/Personality Symptoms:  No data recorded   Mental Status Exam Appearance and self-care  Stature:   Average   Weight:   Average weight   Clothing:   Age-appropriate   Grooming:   Normal   Cosmetic use:   None   Posture/gait:   Normal   Motor activity:   Agitated   Sensorium  Attention:   Normal   Concentration:   Normal   Orientation:   Person; Place   Recall/memory:   Normal   Affect and Mood  Affect:   Constricted   Mood:   Euphoric   Relating  Eye contact:   Normal   Facial expression:   Responsive   Attitude toward examiner:   Cooperative   Thought and Language  Speech flow:  Clear and Coherent   Thought content:   Appropriate to Mood and Circumstances   Preoccupation:   None   Hallucinations:   Auditory   Organization:  No data recorded  [employer] of Knowledge:   Fair   Intelligence:   Average   Abstraction:   Normal   Judgement:   Fair   Dance movement psychotherapist:   Adequate   Insight:   Fair   Decision Making:   Confused   Social Functioning  Social Maturity:   Responsible   Social Judgement:   Normal   Stress  Stressors:   Illness   Coping Ability:   Normal   Skill Deficits:   None   Supports:   Family     Religion:    Leisure/Recreation:    Exercise/Diet: Exercise/Diet Do You Have Any Trouble Sleeping?: Yes Explanation of Sleeping Difficulties: poor sleep past four days   CCA Employment/Education Employment/Work Situation: Employment / Work  Situation Employment Situation: Unemployed Patient's Job has Been Impacted by Current Illness: No  Education: Education Is Patient Currently Attending School?: No   CCA Family/Childhood History Family and Relationship History: Family history Marital status: Long term relationship Does patient have children?: Yes How many children?: 1  Childhood History:     Child/Adolescent Assessment:     CCA Substance Use Alcohol/Drug Use: Alcohol / Drug Use Pain Medications: See MAR Prescriptions: See MAR Over the Counter: See MAR History of alcohol / drug use?: Yes                         ASAM's:  Six Dimensions of Multidimensional Assessment  Dimension 1:  Acute Intoxication and/or Withdrawal Potential:      Dimension 2:  Biomedical Conditions and Complications:      Dimension 3:  Emotional, Behavioral, or Cognitive Conditions and Complications:     Dimension 4:  Readiness to Change:     Dimension 5:  Relapse, Continued use, or Continued Problem Potential:     Dimension 6:  Recovery/Living Environment:     ASAM Severity Score:    ASAM Recommended Level of Treatment:     Substance use Disorder (SUD)    Recommendations for Services/Supports/Treatments: Recommendations for Services/Supports/Treatments Recommendations For Services/Supports/Treatments: Individual Therapy  Discharge Disposition:    DSM5 Diagnoses: Patient Active Problem List   Diagnosis Date Noted   History of anemia 10/05/2020   SOB (shortness of breath) 10/05/2020   Polysubstance abuse (HCC)    Acute respiratory failure with hypoxia (HCC) 04/07/2020   SIRS (systemic inflammatory response syndrome) (HCC) 04/05/2020   Hepatitis 04/04/2020   Acute liver failure 04/04/2020   Alcohol use disorder, mild, in sustained remission 04/04/2020   Thrombocytopenia (HCC) 04/04/2020   Coagulopathy (HCC) 04/04/2020   Ulcer of ankle, left, limited to breakdown of skin (HCC)    Osteomyelitis (HCC)  01/14/2018   Alcohol dependence with uncomplicated withdrawal (HCC) 01/14/2018     Referrals to Alternative Service(s): Referred to Alternative Service(s):   Place:   Date:   Time:    Referred to Alternative Service(s):   Place:   Date:   Time:    Referred to Alternative Service(s):   Place:   Date:   Time:    Referred to Alternative Service(s):   Place:   Date:   Time:     Audree Camel, [facility]

## 2021-02-28 ENCOUNTER — Inpatient Hospital Stay (HOSPITAL_COMMUNITY)
Admission: AD | Admit: 2021-02-28 | Discharge: 2021-03-07 | DRG: 885 | Disposition: A | Discharge status: Home / Self Care | Payer: Medicaid Other | Source: Intra-hospital | Attending: Emergency Medicine | Admitting: Emergency Medicine

## 2021-02-28 ENCOUNTER — Encounter (HOSPITAL_COMMUNITY): Payer: Self-pay | Admitting: Emergency Medicine

## 2021-02-28 ENCOUNTER — Encounter (HOSPITAL_COMMUNITY): Payer: Self-pay | Admitting: Family

## 2021-02-28 DIAGNOSIS — F3162 Bipolar disorder, current episode mixed, moderate: Secondary | ICD-10-CM

## 2021-02-28 DIAGNOSIS — R001 Bradycardia, unspecified: Secondary | ICD-10-CM | POA: Diagnosis present

## 2021-02-28 DIAGNOSIS — Z79899 Other long term (current) drug therapy: Secondary | ICD-10-CM | POA: Diagnosis not present

## 2021-02-28 DIAGNOSIS — Z9114 Patient's other noncompliance with medication regimen: Secondary | ICD-10-CM | POA: Diagnosis not present

## 2021-02-28 DIAGNOSIS — F17210 Nicotine dependence, cigarettes, uncomplicated: Secondary | ICD-10-CM | POA: Diagnosis present

## 2021-02-28 DIAGNOSIS — G4700 Insomnia, unspecified: Secondary | ICD-10-CM | POA: Diagnosis present

## 2021-02-28 DIAGNOSIS — F419 Anxiety disorder, unspecified: Secondary | ICD-10-CM | POA: Diagnosis present

## 2021-02-28 DIAGNOSIS — Z20822 Contact with and (suspected) exposure to covid-19: Secondary | ICD-10-CM | POA: Diagnosis present

## 2021-02-28 DIAGNOSIS — F1910 Other psychoactive substance abuse, uncomplicated: Secondary | ICD-10-CM | POA: Diagnosis present

## 2021-02-28 DIAGNOSIS — F312 Bipolar disorder, current episode manic severe with psychotic features: Secondary | ICD-10-CM

## 2021-02-28 DIAGNOSIS — F3164 Bipolar disorder, current episode mixed, severe, with psychotic features: Principal | ICD-10-CM | POA: Diagnosis present

## 2021-02-28 LAB — POCT URINE DRUG SCREEN - MANUAL ENTRY (I-SCREEN)
POC Amphetamine UR: NOT DETECTED
POC Buprenorphine (BUP): NOT DETECTED
POC Cocaine UR: POSITIVE — AB
POC Marijuana UR: POSITIVE — AB
POC Methadone UR: NOT DETECTED
POC Methamphetamine UR: NOT DETECTED
POC Morphine: NOT DETECTED
POC Oxazepam (BZO): NOT DETECTED
POC Oxycodone UR: NOT DETECTED
POC Secobarbital (BAR): NOT DETECTED

## 2021-02-28 LAB — CBC WITH DIFFERENTIAL/PLATELET
Abs Immature Granulocytes: 0.05 10*3/uL (ref 0.00–0.07)
Basophils Absolute: 0.1 10*3/uL (ref 0.0–0.1)
Basophils Relative: 1 %
Eosinophils Absolute: 0.2 10*3/uL (ref 0.0–0.5)
Eosinophils Relative: 1 %
HCT: 34.6 % — ABNORMAL LOW (ref 36.0–46.0)
Hemoglobin: 11.4 g/dL — ABNORMAL LOW (ref 12.0–15.0)
Immature Granulocytes: 0 %
Lymphocytes Relative: 27 %
Lymphs Abs: 3.2 10*3/uL (ref 0.7–4.0)
MCH: 27.8 pg (ref 26.0–34.0)
MCHC: 32.9 g/dL (ref 30.0–36.0)
MCV: 84.4 fL (ref 80.0–100.0)
Monocytes Absolute: 0.8 10*3/uL (ref 0.1–1.0)
Monocytes Relative: 7 %
Neutro Abs: 7.6 10*3/uL (ref 1.7–7.7)
Neutrophils Relative %: 64 %
Platelets: 439 10*3/uL — ABNORMAL HIGH (ref 150–400)
RBC: 4.1 MIL/uL (ref 3.87–5.11)
RDW: 14.6 % (ref 11.5–15.5)
WBC: 11.8 10*3/uL — ABNORMAL HIGH (ref 4.0–10.5)
nRBC: 0 % (ref 0.0–0.2)

## 2021-02-28 LAB — COMPREHENSIVE METABOLIC PANEL
ALT: 17 U/L (ref 0–44)
AST: 23 U/L (ref 15–41)
Albumin: 4.1 g/dL (ref 3.5–5.0)
Alkaline Phosphatase: 91 U/L (ref 38–126)
Anion gap: 10 (ref 5–15)
BUN: 8 mg/dL (ref 6–20)
CO2: 24 mmol/L (ref 22–32)
Calcium: 9.5 mg/dL (ref 8.9–10.3)
Chloride: 103 mmol/L (ref 98–111)
Creatinine, Ser: 0.62 mg/dL (ref 0.44–1.00)
GFR, Estimated: >60 mL/min (ref >60)
Glucose, Bld: 89 mg/dL (ref 70–99)
Potassium: 2.8 mmol/L — ABNORMAL LOW (ref 3.5–5.1)
Sodium: 137 mmol/L (ref 135–145)
Total Bilirubin: 0.6 mg/dL (ref 0.3–1.2)
Total Protein: 7.7 g/dL (ref 6.5–8.1)

## 2021-02-28 LAB — HEMOGLOBIN A1C
Hgb A1c MFr Bld: 5.6 % (ref 4.8–5.6)
Mean Plasma Glucose: 114.02 mg/dL

## 2021-02-28 LAB — POC SARS CORONAVIRUS 2 AG: SARSCOV2ONAVIRUS 2 AG: NEGATIVE

## 2021-02-28 LAB — POC SARS CORONAVIRUS 2 AG -  ED: SARS Coronavirus 2 Ag: NEGATIVE

## 2021-02-28 LAB — POCT PREGNANCY, URINE: Preg Test, Ur: NEGATIVE

## 2021-02-28 LAB — RESP PANEL BY RT-PCR (FLU A&B, COVID) ARPGX2
Influenza A by PCR: NEGATIVE
Influenza B by PCR: NEGATIVE
SARS Coronavirus 2 by RT PCR: NEGATIVE

## 2021-02-28 LAB — LIPID PANEL
Cholesterol: 182 mg/dL (ref 0–200)
HDL: 51 mg/dL (ref >40)
LDL Cholesterol: 118 mg/dL — ABNORMAL HIGH (ref 0–99)
Total CHOL/HDL Ratio: 3.6 RATIO
Triglycerides: 65 mg/dL (ref <150)
VLDL: 13 mg/dL (ref 0–40)

## 2021-02-28 LAB — TSH: TSH: 0.429 u[IU]/mL (ref 0.350–4.500)

## 2021-02-28 LAB — POTASSIUM: Potassium: 4.3 mmol/L (ref 3.5–5.1)

## 2021-02-28 MED ORDER — POTASSIUM CHLORIDE CRYS ER 20 MEQ PO TBCR
40.0000 meq | EXTENDED_RELEASE_TABLET | Freq: Once | ORAL | Status: AC
  Administered 2021-02-28: 40 meq via ORAL
  Filled 2021-02-28: qty 2

## 2021-02-28 MED ORDER — OLANZAPINE 5 MG PO TBDP
5.0000 mg | ORAL_TABLET | Freq: Two times a day (BID) | ORAL | Status: DC
  Administered 2021-02-28: 5 mg via ORAL
  Filled 2021-02-28: qty 1

## 2021-02-28 MED ORDER — ALUM & MAG HYDROXIDE-SIMETH 200-200-20 MG/5ML PO SUSP: 30.0000 mL | ORAL | Status: DC | PRN

## 2021-02-28 MED ORDER — OLANZAPINE 5 MG PO TBDP
5.0000 mg | ORAL_TABLET | Freq: Three times a day (TID) | ORAL | Status: DC | PRN
  Administered 2021-03-01 – 2021-03-04 (×3): 5 mg via ORAL
  Filled 2021-02-28 (×3): qty 1

## 2021-02-28 MED ORDER — HYDROXYZINE HCL 25 MG PO TABS
25.0000 mg | ORAL_TABLET | Freq: Three times a day (TID) | ORAL | Status: DC | PRN
  Administered 2021-02-28: 25 mg via ORAL
  Filled 2021-02-28: qty 1

## 2021-02-28 MED ORDER — ACETAMINOPHEN 325 MG PO TABS
650.0000 mg | ORAL_TABLET | Freq: Four times a day (QID) | ORAL | Status: DC | PRN
  Filled 2021-02-28: qty 2

## 2021-02-28 MED ORDER — TRAZODONE HCL 50 MG PO TABS
50.0000 mg | ORAL_TABLET | Freq: Every evening | ORAL | Status: DC | PRN
  Administered 2021-02-28 – 2021-03-06 (×7): 50 mg via ORAL
  Filled 2021-02-28 (×3): qty 1
  Filled 2021-02-28: qty 7
  Filled 2021-02-28 (×4): qty 1

## 2021-02-28 MED ORDER — MAGNESIUM HYDROXIDE 400 MG/5ML PO SUSP: 30.0000 mL | Freq: Every day | ORAL | Status: DC | PRN

## 2021-02-28 MED ORDER — LORAZEPAM 1 MG PO TABS
1.0000 mg | ORAL_TABLET | ORAL | Status: DC | PRN
Start: 2021-02-28 — End: 2021-03-05
  Filled 2021-02-28 (×3): qty 1

## 2021-02-28 MED ORDER — POTASSIUM CHLORIDE CRYS ER 20 MEQ PO TBCR
EXTENDED_RELEASE_TABLET | ORAL | Status: AC
  Filled 2021-02-28: qty 2

## 2021-02-28 MED ORDER — ZIPRASIDONE MESYLATE 20 MG IM SOLR
20.0000 mg | INTRAMUSCULAR | Status: DC | PRN
  Filled 2021-02-28 (×2): qty 20

## 2021-02-28 MED ORDER — OLANZAPINE 5 MG PO TBDP
5.0000 mg | ORAL_TABLET | Freq: Two times a day (BID) | ORAL | Status: DC
  Administered 2021-02-28 – 2021-03-01 (×2): 5 mg via ORAL
  Filled 2021-02-28 (×7): qty 1

## 2021-02-28 MED ORDER — POTASSIUM CHLORIDE CRYS ER 20 MEQ PO TBCR
40.0000 meq | EXTENDED_RELEASE_TABLET | Freq: Once | ORAL | Status: AC
  Administered 2021-02-28: 40 meq via ORAL

## 2021-02-28 NOTE — Progress Notes (Signed)
Lab drawn. [facility] called for STAT pick up.

## 2021-02-28 NOTE — ED Notes (Signed)
Patient denies pain and is resting comfortably.  

## 2021-02-28 NOTE — ED Notes (Signed)
Pt is currently sleeping, breathing even and unlabored, environment check complete/secure, will continue to monitor patient for safety 

## 2021-02-28 NOTE — Tx Team (Signed)
Initial Treatment Plan 02/28/2021 6:11 PM [name] [id number]    PATIENT STRESSORS: Medication change or noncompliance Substance abuse   PATIENT STRENGTHS: General fund of knowledge Supportive family/friends   PATIENT IDENTIFIED PROBLEMS: Psychosis    "I want to get my new life started"                 DISCHARGE CRITERIA:  Improved stabilization in mood, thinking, and/or behavior Need for constant or close observation no longer present Reduction of life-threatening or endangering symptoms to within safe limits Verbal commitment to aftercare and medication compliance  PRELIMINARY DISCHARGE PLAN: Outpatient therapy Medication management  PATIENT/FAMILY INVOLVEMENT: This treatment plan has been presented to and reviewed with the patient, Gina Hooper.  The patient and family have been given the opportunity to ask questions and make suggestions.  Levin Bacon, RN 02/28/2021, 6:11 PM

## 2021-02-28 NOTE — ED Notes (Signed)
Pt has been brought on the unit and familiarized with the unit, nutrition has been offered which patient declined. Environment check has been completed/secured. Pt is lying in bed quietly breathing even and unlabored, no complaint of pain, will continue to monitor patient for safety.

## 2021-02-28 NOTE — ED Notes (Signed)
Patient discharged to [facility] via law enforcement. All belongings from [place] locker received by patient.

## 2021-02-28 NOTE — Progress Notes (Signed)
Pt is asleep. Respirations are even and unlabored. No signs of acute distress noted. Safety is maintained.  

## 2021-02-28 NOTE — Progress Notes (Signed)
Pt accepted to [facility]  Patient meets inpatient criteria per Liborio Nixon, NP  Dr.Amy Mason Jim is the attending provider.    Call report to [phone]    Milas Hock, RN @ [facility] notified.     Pt scheduled  to arrive at [facility] by 1600.   Damita Dunnings, MSW, LCSW-A  3:16 PM 02/28/2021

## 2021-02-28 NOTE — Progress Notes (Signed)
Pt is presently asleep. Respirations are even and unlabored. No signs of acute distress noted. Pt denies pain, SI/HI/AVH at this time. Staff will monitor for pt's safety.

## 2021-02-28 NOTE — Progress Notes (Signed)
[name] is a 30 year old female being admitted involuntarily to 403[facility]1 from [facility].  She presented to [facility] accompanied by her fiance due to noncompliance with her prescribed medications, poor sleep, auditory hallucinations and acting bizarre at the house.  During [facility] admission, she was pleasant and cooperative.  She denies any SI/HI or AVH.  She was clearly responding to internal stimuli and laughing at inappropriate times.  No physical complaints voiced.  She stated that she brought in her fiance to [facility] because he was having "mental health issues."  She stated that she now wants to start a new life without him because of this. Admission paperwork completed and signed.  Belongings searched and secured in locker # 20.  Skin assessment completed and noted old scar on left ankle.  Suicide safety plan reviewed, given to patient to complete and return to her nurse.  Q 15 minute checks initiated for safety.  We will monitor the progress towards her goals.
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
## 2021-02-28 NOTE — Progress Notes (Signed)
RN attempt to give Nursing report unsuccessful due to crisis on the unit. RN left telephone number with staff.

## 2021-02-28 NOTE — Progress Notes (Signed)
Patient information has been sent to [facility] via secure chat to review for potential admission. Patient meets inpatient criteria per Melbourne Abts, PA-C.   Situation ongoing, CSW will continue to monitor progress.    Signed:  Damita Dunnings, MSW, LCSW-A  02/28/2021 10:02 AM

## 2021-02-28 NOTE — Progress Notes (Signed)
Nursing report given to Herbert Seta, RN at [facility].

## 2021-03-01 DIAGNOSIS — F312 Bipolar disorder, current episode manic severe with psychotic features: Secondary | ICD-10-CM

## 2021-03-01 MED ORDER — OLANZAPINE 5 MG PO TBDP
5.0000 mg | ORAL_TABLET | Freq: Every day | ORAL | Status: DC
  Administered 2021-03-02 – 2021-03-04 (×3): 5 mg via ORAL
  Filled 2021-03-01 (×5): qty 1

## 2021-03-01 MED ORDER — THIAMINE HCL 100 MG/ML IJ SOLN: 100.0000 mg | Freq: Once | INTRAMUSCULAR | Status: DC

## 2021-03-01 MED ORDER — OLANZAPINE 10 MG PO TBDP
10.0000 mg | ORAL_TABLET | Freq: Every day | ORAL | Status: DC
  Administered 2021-03-01 – 2021-03-03 (×3): 10 mg via ORAL
  Filled 2021-03-01 (×5): qty 1

## 2021-03-01 MED ORDER — ONDANSETRON 4 MG PO TBDP: 4.0000 mg | ORAL_TABLET | Freq: Four times a day (QID) | ORAL | Status: AC | PRN

## 2021-03-01 MED ORDER — LOPERAMIDE HCL 2 MG PO CAPS: 2.0000 mg | ORAL_CAPSULE | ORAL | Status: AC | PRN

## 2021-03-01 MED ORDER — THIAMINE HCL 100 MG PO TABS
100.0000 mg | ORAL_TABLET | Freq: Every day | ORAL | Status: DC
  Administered 2021-03-02 – 2021-03-07 (×6): 100 mg via ORAL
  Filled 2021-03-01 (×8): qty 1

## 2021-03-01 MED ORDER — HYDROXYZINE HCL 25 MG PO TABS
25.0000 mg | ORAL_TABLET | Freq: Four times a day (QID) | ORAL | Status: AC | PRN
  Administered 2021-03-01 – 2021-03-03 (×3): 25 mg via ORAL
  Filled 2021-03-01 (×4): qty 1

## 2021-03-01 MED ORDER — ADULT MULTIVITAMIN W/MINERALS CH
1.0000 | ORAL_TABLET | Freq: Every day | ORAL | Status: DC
  Administered 2021-03-02 – 2021-03-07 (×5): 1 via ORAL
  Filled 2021-03-01 (×9): qty 1

## 2021-03-01 MED ORDER — LORAZEPAM 1 MG PO TABS
1.0000 mg | ORAL_TABLET | Freq: Four times a day (QID) | ORAL | Status: AC | PRN
  Administered 2021-03-01 – 2021-03-02 (×2): 1 mg via ORAL

## 2021-03-01 NOTE — Progress Notes (Addendum)
Pt been sleep much of the evening , pt stated she was doing ok this evening. Pt given PRN Trazodone and Vistaril per MAR with HS medication.    03/01/21 2000  Psych Admission Type (Psych Patients Only)  Admission Status Involuntary  Psychosocial Assessment  Patient Complaints Anxiety  Eye Contact Fair  Facial Expression Animated  Affect Preoccupied  Speech Unremarkable  Interaction Superficial  Motor Activity Hyperactive  Appearance/Hygiene Unremarkable  Thought Process  Coherency Concrete thinking  Content Ambivalence  Delusions None reported or observed  Perception WDL  Hallucination None reported or observed  Judgment Impaired  Confusion WDL  Danger to Self  Current suicidal ideation? Denies  Danger to Others  Danger to Others None reported or observed

## 2021-03-01 NOTE — H&P (Signed)
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 symptoms and mood stabilization.  Patient has been started on CIWA protocol with PRN lorazepam in the event that she is confused about her reporting alcohol use prior to admission.  See MAR.    Consultations:    Discharge Concerns:    Estimated LOS: 4 to 6 days  Other:     Physician Treatment Plan for Primary Diagnosis: Bipolar I disorder, current or most recent episode manic, with psychotic features ([place]) Long Term Goal(s): Improvement in symptoms so as ready for discharge  Short Term Goals: Ability to identify changes in lifestyle to reduce recurrence of condition will improve, Ability to verbalize feelings will improve, Ability to disclose and discuss  suicidal ideas, Ability to demonstrate self-control will improve, Ability to identify and develop effective coping behaviors will improve, Ability to maintain clinical measurements within normal limits will improve, Compliance with prescribed medications will improve, and Ability to identify triggers associated with substance abuse/mental health issues will improve  Physician Treatment Plan for Secondary Diagnosis: Principal Problem:   Bipolar I disorder, current or most recent episode manic, with psychotic features ([place]) Active Problems:   Polysubstance abuse ([place])  Long Term Goal(s): Improvement in symptoms so as ready for discharge  Short Term Goals: Ability to identify changes in lifestyle to reduce recurrence of condition will improve, Ability to verbalize feelings will improve, Ability to disclose and discuss suicidal ideas, Ability to demonstrate self-control will improve, Ability to identify and develop effective coping behaviors will improve, Ability to maintain clinical measurements within normal limits will improve, Compliance with prescribed medications will improve, and Ability to identify triggers associated with substance abuse/mental  health issues will improve  I certify that inpatient services furnished can reasonably be expected to improve the patient's condition.    Arthor Captain, MD 7/8/20221:03 PM

## 2021-03-01 NOTE — BHH Group Notes (Signed)
Type of Therapy and Topic:  Group Therapy - Healthy vs Unhealthy Coping Skills  Participation Level:  Active   Description of Group The focus of this group was to determine what unhealthy coping techniques typically are used by group members and what healthy coping techniques would be helpful in coping with various problems. Patients were guided in becoming aware of the differences between healthy and unhealthy coping techniques. Patients were asked to identify 2-3 healthy coping skills they would like to learn to use more effectively.  Therapeutic Goals Patients learned that coping is what human beings do all day long to deal with various situations in their lives Patients defined and discussed healthy vs unhealthy coping techniques Patients identified their preferred coping techniques and identified whether these were healthy or unhealthy Patients determined 2-3 healthy coping skills they would like to become more familiar with and use more often. Patients provided support and ideas to each other   Summary of Patient Progress:  Due to the acuity and complex discharge plans, group was not held. Patient was provided therapeutic worksheets and asked to meet with CSW as needed. 

## 2021-03-01 NOTE — Progress Notes (Signed)
Recreation Therapy Notes  Date: 7.8.22 Time: 0930 Location: 300 Hall Dayroom   Group Topic: Stress Management   Goal Area(s) Addresses: Patient will actively participate in stress management techniques presented during session. Patient will successfully identify benefit of practicing stress management post d/c.   Intervention: Guided exercise with ambient sound and script   Activity :Guided Imagery   LRT provided education, instruction, and demonstration on practice of visualization via guided imagery. Patient was asked to participate in the technique introduced during session. LRT also debriefed including topics of mindfulness, stress management and specific scenarios each patient could use these techniques. Patients were given suggestions of ways to access scripts post d/c and encouraged to explore Youtube and other apps available on smartphones, tablets, and computers.   Education:  Stress Management, Discharge Planning.   Education Outcome: Acknowledges education   Clinical Observations/Feedback:  Group did not occur due to goals group going over time.         Caroll Rancher, LRT/CTRS         Lillia Abed, [place] A 03/01/2021 11:19 AM

## 2021-03-01 NOTE — Progress Notes (Addendum)
Pt is alert and oriented to person, place, time and situation. Pt is calm, cooperative, pleasant, denies suicidal and homicidal ideation, denies hallucinations, denies feelings of depression and anxiety. Pt has a bright affect, smiles on contact. Pt is medication compliant. No distress noted, none reported, pt voices no complaints. Pt is guarded about discussing admission triggers, states, "just some mental issues." Pt spends time quietly in her room often, comes out for meds and meals. Will continue to monitor pt per Q15 minute face checks and monitor for safety and progress.   At around 1540, pt attempted to elope off the unit, called her boyfriend, and was redirected back to the unit, agreed to go to 500 unit for transfer and was placed on unit restriction by pt's MD/psychiatrist. Pt was talking to this writer while on the 500 unit, and reported delusional thinking, pt states, "I own the the world, and I don't understand why people are taking too long to get me out of here." Reality orientation provided, including that pt is not scheduled for discharge today, but pt was not receptive. Pt became anxious, and started pacing in the hallway. Report was given to 500 unit nurse as a hand off and 500 unit nurse followed up with pt regarding offering pt PRN medications for symptoms of increasing anxiety. MHT on 500 unit also given report, including pt's sudden elopement and delusional thinking, focus on discharge and increased anxiety, also the unit restrictions. Pt continues to deny suicidal or homicidal ideation, and denies hallucinations and depression.

## 2021-03-01 NOTE — Tx Team (Signed)
Interdisciplinary Treatment and Diagnostic Plan Update  03/01/2021 Time of Session: 9:55am Gina Hooper MRN: [id number]  Principal Diagnosis: <principal problem not specified>  Secondary Diagnoses: Active Problems:   Bipolar 1 disorder (HCC)   Current Medications:  Current Facility-Administered Medications  Medication Dose Route Frequency Provider Last Rate Last Admin   acetaminophen (TYLENOL) tablet 650 mg  650 mg Oral Q6H PRN Lucky Rathke, FNP       alum & mag hydroxide-simeth (MAALOX/MYLANTA) 200-200-20 MG/5ML suspension 30 mL  30 mL Oral Q4H PRN Lucky Rathke, FNP       hydrOXYzine (ATARAX/VISTARIL) tablet 25 mg  25 mg Oral TID PRN Lucky Rathke, FNP   25 mg at 02/28/21 2136   OLANZapine zydis (ZYPREXA) disintegrating tablet 5 mg  5 mg Oral Q8H PRN Lucky Rathke, FNP       And   LORazepam (ATIVAN) tablet 1 mg  1 mg Oral PRN Lucky Rathke, FNP       And   ziprasidone (GEODON) injection 20 mg  20 mg Intramuscular PRN Lucky Rathke, FNP       magnesium hydroxide (MILK OF MAGNESIA) suspension 30 mL  30 mL Oral Daily PRN Lucky Rathke, FNP       OLANZapine zydis (ZYPREXA) disintegrating tablet 5 mg  5 mg Oral BID Lucky Rathke, FNP   5 mg at 03/01/21 0160   traZODone (DESYREL) tablet 50 mg  50 mg Oral QHS PRN Lucky Rathke, FNP   50 mg at 02/28/21 2136   PTA Medications: Medications Prior to Admission  Medication Sig Dispense Refill Last Dose   QUEtiapine (SEROQUEL) 50 MG tablet Take 1 tablet (50 mg total) by mouth at bedtime. 30 tablet 0     Patient Stressors: Medication change or noncompliance Substance abuse  Patient Strengths: General fund of knowledge Supportive family/friends  Treatment Modalities: Medication Management, Group therapy, Case management,  1 to 1 session with clinician, Psychoeducation, Recreational therapy.   Physician Treatment Plan for Primary Diagnosis: <principal problem not specified> Long Term Goal(s):     Short Term Goals:    Medication  Management: Evaluate patient's response, side effects, and tolerance of medication regimen.  Therapeutic Interventions: 1 to 1 sessions, Unit Group sessions and Medication administration.  Evaluation of Outcomes: Not Met  Physician Treatment Plan for Secondary Diagnosis: Active Problems:   Bipolar 1 disorder ([place])  Long Term Goal(s):     Short Term Goals:       Medication Management: Evaluate patient's response, side effects, and tolerance of medication regimen.  Therapeutic Interventions: 1 to 1 sessions, Unit Group sessions and Medication administration.  Evaluation of Outcomes: Not Met   RN Treatment Plan for Primary Diagnosis: <principal problem not specified> Long Term Goal(s): Knowledge of disease and therapeutic regimen to maintain health will improve  Short Term Goals: Ability to remain free from injury will improve, Ability to verbalize frustration and anger appropriately will improve, Ability to identify and develop effective coping behaviors will improve, and Compliance with prescribed medications will improve  Medication Management: RN will administer medications as ordered by provider, will assess and evaluate patient's response and provide education to patient for prescribed medication. RN will report any adverse and/or side effects to prescribing provider.  Therapeutic Interventions: 1 on 1 counseling sessions, Psychoeducation, Medication administration, Evaluate responses to treatment, Monitor vital signs and CBGs as ordered, Perform/monitor CIWA, COWS, AIMS and Fall Risk screenings as ordered, Perform wound care treatments as ordered.  Evaluation of Outcomes: Not Met   LCSW Treatment Plan for Primary Diagnosis: <principal problem not specified> Long Term Goal(s): Safe transition to appropriate next level of care at discharge, Engage patient in therapeutic group addressing interpersonal concerns.  Short Term Goals: Engage patient in aftercare planning with referrals  and resources, Increase social support, Increase ability to appropriately verbalize feelings, Facilitate patient progression through stages of change regarding substance use diagnoses and concerns, Identify triggers associated with mental health/substance abuse issues, and Increase skills for wellness and recovery  Therapeutic Interventions: Assess for all discharge needs, 1 to 1 time with Social worker, Explore available resources and support systems, Assess for adequacy in community support network, Educate family and significant other(s) on suicide prevention, Complete Psychosocial Assessment, Interpersonal group therapy.  Evaluation of Outcomes: Not Met   Progress in Treatment: Attending groups: No. and As evidenced by:  recently arriving on the unit Participating in groups: No. Taking medication as prescribed: Yes. Toleration medication: Yes. Family/Significant other contact made: No, will contact:  if consent is provided  Patient understands diagnosis: Yes. Discussing patient identified problems/goals with staff: Yes. Medical problems stabilized or resolved: Yes. Denies suicidal/homicidal ideation: Yes. Issues/concerns per patient self-inventory: No.   New problem(s) identified: No, Describe:  none  New Short Term/Long Term Goal(s): medication stabilization, elimination of SI thoughts, development of comprehensive mental wellness plan.   Patient Goals:  did not attend  Discharge Plan or Barriers: Patient recently admitted. CSW will continue to follow and assess for appropriate referrals and possible discharge planning.    Reason for Continuation of Hospitalization: Anxiety Depression Hallucinations Medication stabilization Suicidal ideation  Estimated Length of Stay: 3-5 days  Attendees: Patient: Did not attend 03/01/2021   Physician:  03/01/2021   Nursing:  03/01/2021   RN Care Manager: 03/01/2021   Social Worker: Darletta Moll, LCSW 03/01/2021   Recreational Therapist:   03/01/2021   Other:  03/01/2021   Other:  03/01/2021   Other: 03/01/2021     Scribe for Treatment Team: Vassie Moselle, [place] 03/01/2021 10:32 AM

## 2021-03-01 NOTE — BHH Suicide Risk Assessment (Signed)
[facility] Admission Suicide Risk Assessment   Nursing information obtained from:    Demographic factors:  NA Current Mental Status:  NA Loss Factors:  NA Historical Factors:  NA Risk Reduction Factors:  Sense of responsibility to family  Total Time spent with patient: 30 minutes Principal Problem: Bipolar I disorder, current or most recent episode manic, with psychotic features ([place]) Diagnosis:  Principal Problem:   Bipolar I disorder, current or most recent episode manic, with psychotic features ([place]) Active Problems:   Polysubstance abuse ([place])  Subjective Data: Medical record reviewed.  Patient's case discussed in detail with members of the treatment team.  I met with and evaluated the patient on the unit today.  Patient is disorganized and lacks insight into the reasons for hospitalization and is a poor historian.  History was supplemented by information obtained in the chart.  Gina Hooper is a 30 year old female with a history of bipolar disorder, polysubstance use disorder including alcohol use disorder in reported early remission as well as a history of acute liver failure and hepatitis resulting in ICU hospital admission of 28 days duration in August-September 2021.  The patient presented to [facility] on 02/27/2021 on IVC petitioned by her fianc for insomnia, confusion, disorientation, responding to internal stimuli, disorganized and bizarre behavior (attempting to get into someone else's house) in the context of medication nonadherence and substance use.  ED notes document that patient and her fianc reported that she had twitching in her eyes and mouth for the past 4 months and had been experiencing auditory hallucinations of multiple voices for the past few months.  Patient also was making grandiose statements that "she is the [place] and controls the world".  At [facility] patient stated her belief that she was pregnant.  Urine pregnancy test at [facility] was negative and urine drug screen was positive  for cocaine and marijuana.  BAL was not performed.  Serum potassium was initially low at 2.8 but with potassium supplement repeat potassium was 4.3.  Ammonia level was not performed but LFTs were WNL.  Patient and her fianc provided conflicting information about when patient ran out of her outpatient prescription for Seroquel (50 mg PO QHS) with fianc stating she had been out for several weeks and patient stating she had been out for several days.  At [facility] patient was started on olanzapine 5 mg BID, trazodone 50 mg QHS PRN insomnia and hydroxyzine 25 mg TID PRN anxiety.  Patient was transferred to [facility] late yesterday afternoon and slept 6.75 hours last night.  On evaluation with me today, she is pleasant but disheveled, guarded, disorganized, suspicious and appears intermittently internally preoccupied.  She displays speech latency and provides vague or nonresponsive answers to many questions.  No motor abnormalities are observed.  The patient states that she is in the hospital "to remain on my regular prescription from [facility] for bipolar depression" and wants to go home.  She states that she recently learned that she is pregnant.  When I ask her how she learned of her pregnancy, patient replies "it is complicated" and declines to elaborate.  I discussed her negative pregnancy test from [facility] and she stated she would like to get her own pregnancy test after discharge.  Patient makes some vague statements about her fianc that are possibly paranoid in nature.  Patient is fully oriented to date, city and hospital but not to the reasons for admission.  She is unable to give a history of symptoms in the days prior to admission.  She acknowledges not sleeping well for at least several nights but is unable to answer most other questions about events and symptoms during the days PTA.  She denies SI, AI, PI, AH or VH.  She describes her mood as "great, happy" and makes vague grandiose statements about being "good at  everything and every sport and dancing."  Patient reports that she has not taken any psychiatric medication since March 2021.  She is unable to state the medications that she has taken in the past.  She denies any alcohol use since September 2021 and denies any recent drug use.    Patient reports past psychiatric history with diagnosis of bipolar disorder and outpatient treatment at [facility].  She is a poor historian and is unable to state when she was diagnosed with bipolar disorder or the names of medications she has taken in the past.  Chart review indicates that patient was most recently treated with Seroquel and had also been treated with Depakote until sometime in 2021.  The patient denies any history of prior inpatient psychiatric admissions.  She denies any history of suicide attempts.  Patient states that she has not consumed alcohol since she was hospitalized for liver problems 10 months ago.  Chart review indicates that patient was admitted from 04/04/2020 until 05/01/2020 to [facility] ICU with hepatitis, acute liver failure, thrombocytopenia, acute respiratory failure with hypoxia, coagulopathy and SIRS.  Liver failure during that admission was believed to be secondary to alcohol use disorder.  On interview today, patient is adamant that she has not consumed alcohol since her ICU admission.  She denies any use of cocaine, marijuana, heroin, methamphetamine, benzodiazepines, opiates or other drugs.  She smokes 1 to 2 packs of cigarettes per day.  Continued Clinical Symptoms:  Alcohol Use Disorder Identification Test Final Score (AUDIT): 0 The "Alcohol Use Disorders Identification Test", Guidelines for Use in Primary Care, Second Edition.  World Pharmacologist [facility]). Score between 0-7:  no or low risk or alcohol related problems. Score between 8-15:  moderate risk of alcohol related problems. Score between 16-19:  high risk of alcohol related problems. Score 20 or above:  warrants further  diagnostic evaluation for alcohol dependence and treatment.   CLINICAL FACTORS:  Bipolar Disorder  Alcohol/Substance Abuse/Dependencies Currently Psychotic Previous Psychiatric Diagnoses and Treatments   Musculoskeletal: Strength & Muscle Tone: within normal limits Gait & Station: normal Patient leans: N/A  Psychiatric Specialty Exam:  Presentation  General Appearance: Disheveled  Eye Contact:Good  Speech:Clear and Coherent; Slow  Speech Volume:Normal  Handedness:Right   Mood and Affect  Mood:Anxious (Elevated - "Great.  Happy")  Affect:Full Range   Thought Process  Thought Processes:Disorganized  Descriptions of Associations:Tangential  Orientation:Full (Time, Place and Person)  Thought Content:Paranoid Ideation; Delusions  History of Schizophrenia/Schizoaffective disorder:No  Duration of Psychotic Symptoms:Less than six months  Hallucinations:Hallucinations: Other (comment) (Denies AH/VH.  Appears internally preoccupied at times.)  Ideas of Reference:Paranoia; Delusions  Suicidal Thoughts:Suicidal Thoughts: No  Homicidal Thoughts:Homicidal Thoughts: No   Sensorium  Memory:Immediate Fair; Recent Poor; Remote Fair  Judgment:Impaired  Insight:Shallow   Executive Functions  Concentration:Fair  Attention Span:Fair  [place]   Psychomotor Activity  Psychomotor Activity:Psychomotor Activity: Normal   Assets  Assets:Desire for Improvement; Armed forces logistics/support/administrative officer; Housing; Leisure Time; Social Support; Talents/Skills   Sleep  Sleep:Sleep: Fair Number of Hours of Sleep: 6.7    Physical Exam: Physical Exam Vitals and nursing note reviewed.  Constitutional:      General:  She is not in acute distress.    Appearance: She is not diaphoretic.  HENT:     Head: Normocephalic and atraumatic.  Pulmonary:     Effort: Pulmonary effort is normal.  Neurological:     General: No focal deficit  present.     Mental Status: She is alert and oriented to person, place, and time.   Review of Systems  Constitutional:  Negative for chills, diaphoresis, fever and malaise/fatigue.  HENT:  Negative for sore throat.   Respiratory:  Negative for cough and shortness of breath.   Cardiovascular:  Negative for chest pain and palpitations.  Gastrointestinal: Negative.  Negative for abdominal pain, blood in stool, constipation, diarrhea, nausea and vomiting.  Genitourinary: Negative.   Musculoskeletal: Negative.   Skin:  Negative for rash.  Neurological:  Negative for dizziness, tremors, seizures and headaches.  Psychiatric/Behavioral:  Positive for hallucinations, memory loss and substance abuse. Negative for depression and suicidal ideas. The patient is nervous/anxious and has insomnia.   Blood pressure 127/83, pulse 82, temperature 97.9 F (36.6 C), temperature source Oral, resp. rate 18, height $RemoveBe'5\' 5"'SliSSWCzV$  (1.651 m), weight 66.7 kg, SpO2 98 %. Body mass index is 24.46 kg/m.   COGNITIVE FEATURES THAT CONTRIBUTE TO RISK:  Closed-mindedness and Thought constriction (tunnel vision)    SUICIDE RISK:   Moderate:  Frequent suicidal ideation with limited intensity, and duration, some specificity in terms of plans, no associated intent, good self-control, limited dysphoria/symptomatology, some risk factors present, and identifiable protective factors, including available and accessible social support.  PLAN OF CARE: Patient has been admitted to the 400 inpatient unit for treatment and stabilization of for exacerbation of psychotic and manic symptoms which have likely been exacerbated by recent substance use.  Patient has a history of hepatitis and liver failure secondary to alcohol use disorder but denies any use of alcohol since she was hospitalized with liver failure 10 months ago.  Patient's liver function studies were WNL in the ED. we will continue every 15-minute safety checks.  Encourage participation  in group therapy and therapeutic milieu.  Available lab results reviewed.  CMP showed potassium of 2.8 and was otherwise WNL.  Repeat potassium on 0707 was WNL at 4.3.  Lipid panel showed LDL of 118 and was otherwise WNL.  CBC revealed WBC of 11.8, hemoglobin of 11.4, hematocrit of 34.6, platelets of 439 and otherwise WNL.  Differential is WNL.  Hemoglobin A1c was 5.6.  Urine pregnancy test was negative.  TSH was 0.429.  Influenza A, influenza B and coronavirus testing were negative.  Urine tox screen was positive for cocaine and marijuana.  BAL was not performed.  EKG from 02/28/2021 showed sinus bradycardia with rate of 59 and QT/QTc of 414/409.  Will order ammonia level given history of liver failure and will order repeat pregnancy test given patient's concerns for pregnancy.  We will continue olanzapine and increase to 5 mg every morning and 10 mg every afternoon for psychotic symptoms and mood stabilization.  Patient has been started on CIWA protocol with PRN lorazepam in the event that she is confused about her reporting alcohol use prior to admission.  See MAR.  Estimated length of stay 4 to 6 days.  I certify that inpatient services furnished can reasonably be expected to improve the patient's condition.   Arthor Captain, MD 03/01/2021, 12:51 PM

## 2021-03-01 NOTE — BHH Counselor (Signed)
Adult Comprehensive Assessment  Patient ID: Gina Hooper, female   DOB: December 15, 1990, 30 y.o.   MRN: [id number]  Information Source: Information source: Patient  Current Stressors:  Patient states their primary concerns and needs for treatment are:: "I can't recall" Patient states their goals for this hospitilization and ongoing recovery are:: "To go home" Educational / Learning stressors: Pt reports an 11th grade education Employment / Job issues: Pt reports being unemployed Family Relationships: Pt reports no stressors Surveyor, quantity / Lack of resources (include bankruptcy): Pt reports no stressors Housing / Lack of housing: Pt reports living with her grandmother Physical health (include injuries & life threatening diseases): Pt reports medical concerns with her Liver Social relationships: Pt reports no stressors Substance abuse: Pt denies all substance use Bereavement / Loss: Pt reports no stressors  Living/Environment/Situation:  Living Arrangements: Other relatives Living conditions (as described by patient or guardian): "It is loving and supportive" Who else lives in the home?: Grandmother, great-grandmother, great-grandfather, and sister How long has patient lived in current situation?: "My entire life" What is atmosphere in current home: Supportive, Comfortable  Family History:  Marital status: Single Are you sexually active?: Yes What is your sexual orientation?: Heterosexual Has your sexual activity been affected by drugs, alcohol, medication, or emotional stress?: No Does patient have children?: Yes How many children?: 1 How is patient's relationship with their children?: "I have an 81 year old daughter and we get along great but she lives with her paternal grandmother"  Childhood History:  By whom was/is the patient raised?: Grandparents Additional childhood history information: Pt reports her parents were around often but she could not live with them, Pt did not  disclose any other information Description of patient's relationship with caregiver when they were a child: "We got along great" Patient's description of current relationship with people who raised him/her: "We all still get along great" How were you disciplined when you got in trouble as a child/adolescent?: Spankings Does patient have siblings?: Yes Number of Siblings: 4 Description of patient's current relationship with siblings: "I have 3 brothers and a sister and we all get along great" Did patient suffer any verbal/emotional/physical/sexual abuse as a child?: No Did patient suffer from severe childhood neglect?: No Has patient ever been sexually abused/assaulted/raped as an adolescent or adult?: No Was the patient ever a victim of a crime or a disaster?: No Witnessed domestic violence?: No Has patient been affected by domestic violence as an adult?: No  Education:  Highest grade of school patient has completed: 11th grade Currently a student?: No Learning disability?: No  Employment/Work Situation:   Employment Situation: Unemployed Patient's Job has Been Impacted by Current Illness: No What is the Longest Time Patient has Held a Job?: N/A Where was the Patient Employed at that Time?: Pt reports no previous employment Has Patient ever Been in the [employer]?: No  Financial Resources:   Surveyor, quantity resources: Support from parents / caregiver, Sales executive, Medicaid Does patient have a Lawyer or guardian?: No  Alcohol/Substance Abuse:   What has been your use of drugs/alcohol within the last 12 months?: Pt denies all substance use If attempted suicide, did drugs/alcohol play a role in this?: No Alcohol/Substance Abuse Treatment Hx: Denies past history Has alcohol/substance abuse ever caused legal problems?: No  Social Support System:   Patient's Community Support System: Good Describe Community Support System: Grandmother, family, friends Type of faith/religion:  [place] How does patient's faith help to cope with current illness?: Church and prayer  Leisure/Recreation:   Do You Have Hobbies?: Yes Leisure and Hobbies: Dancing, running, basketball, and sports  Strengths/Needs:   What is the patient's perception of their strengths?: "I like to laugh" Patient states they can use these personal strengths during their treatment to contribute to their recovery: "It makes other people smile so then I smile" Patient states these barriers may affect/interfere with their treatment: None Patient states these barriers may affect their return to the community: None Other important information patient would like considered in planning for their treatment: None  Discharge Plan:   Currently receiving community mental health services: Yes (From Whom) (Psychiatry at [facility]) Patient states concerns and preferences for aftercare planning are: Pt would like to keep her Psychatrist at [facility] and add therapy from [facility] as well Patient states they will know when they are safe and ready for discharge when: "I am safe and ready to leave now" Does patient have access to transportation?: Yes (Grandmother) Does patient have financial barriers related to discharge medications?: No Will patient be returning to same living situation after discharge?: Yes  Summary/Recommendations:   Summary and Recommendations (to be completed by the evaluator): Gina Hooper is a 30 year old, female, who was admitted to the hospital due to Delusions and Auditory and Visual Hallucinations.  The Pt reports that she does not recall why she is in the hospital but states that she is not experiencing SI or HI at this time.  The Pt reports living with her grandmother, great-grandmother, great-grandfather, and sister.  The Pt did not mention her Fiance during this interview.  The Pt reports having a 30 year old daughter that lives with her paternal grandmother and denies any family conflicts, past or present.   The Pt reports being unemployed but denies financial stressors stating that she has applied for SSDI and receives foodstamps and Medicaid.  The Pt reports her only stressor is an unknown issue with her Liver.  The Pt did not disclose any further health information.  The Pt also denies all substance use and any previous substance use treatment.  While in the hospital the Pt can benefit from crisis stabilization, medication management, group therapy, psycho-education, case management, and discahrge planning.  Upon discharge the Pt would like to continue her medication management with [facility] and would like to add therapy at the [facility] as well.  Gina Hooper. 03/01/2021

## 2021-03-02 LAB — AMMONIA: Ammonia: 28 umol/L (ref 9–35)

## 2021-03-02 LAB — HCG, SERUM, QUALITATIVE: Preg, Serum: NEGATIVE
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
## 2021-03-02 NOTE — Progress Notes (Signed)
   03/02/21 2110  Psych Admission Type (Psych Patients Only)  Admission Status Involuntary  Psychosocial Assessment  Patient Complaints Other (Comment) (pt preoccupied with being discharged)  Eye Contact Fair  Facial Expression Animated  Affect Preoccupied  Speech Unremarkable  Interaction Assertive  Motor Activity Hyperactive  Appearance/Hygiene Unremarkable  Behavior Characteristics Cooperative;Appropriate to situation;Anxious  Mood Anxious;Pleasant  Aggressive Behavior  Effect No apparent injury  Thought Process  Coherency Concrete thinking  Content Ambivalence  Delusions None reported or observed  Perception WDL  Hallucination None reported or observed  Judgment Impaired  Confusion WDL  Danger to Self  Current suicidal ideation? Denies  Danger to Others  Danger to Others None reported or observed

## 2021-03-02 NOTE — BHH Group Notes (Signed)
LCSW Group Therapy Note 03/02/2021  10:15am-11:00am  Type of Therapy and Topic:  Group Therapy: Anger and Commonalities  Participation Level:  Did Not Attend   Description of Group: In this group, patients initially shared an "unknown" fact about themselves and CSW led a discussion about the ways in which we have things in common without realizing it.  Patient then identified a recent time they became angry and how this yet again showed a way in which they had something in common with other patients  We discussed possible unhealthy reactions to anger and possible healthy reactions.  We also discussed possible underlying emotions that lead to the anger.  Commonalities among group members were pointed out throughout the entirety of group.  Therapeutic Goals: Patients were asked to share something about themselves and learned that they often have things in common with other people without knowing this Patients will remember their last incident of anger and how they reacted Patients will be able to identify their reaction as healthy or unhealthy, and identify possible reactions that would have been the opposite Patients will learn that anger itself is a secondary emotion and will think about their primary emotion at the time of their last incident of anger  Summary of Patient Progress:  The patient was invited to group, did not attend.  Therapeutic Modalities:   Cognitive Behavioral Therapy  Lynnell Chad, LCSW 03/02/2021  3:15 PM

## 2021-03-03 NOTE — Progress Notes (Signed)
Pt did not attend orientation/goals group. 

## 2021-03-03 NOTE — Progress Notes (Signed)
Progress note  Pt continues to be paranoid and can be manipulative with taking medications. Pt was compliant with medications. Pt still responding and laughing inappropriately at times.    03/03/21 0730  Psych Admission Type (Psych Patients Only)  Admission Status Involuntary  Psychosocial Assessment  Patient Complaints Anxiety;Confusion;Restlessness  Eye Contact Fair  Facial Expression Animated;Anxious;Pensive  Affect Anxious;Appropriate to circumstance;Preoccupied  Speech Tangential  Interaction Cautious;Forwards little;Guarded  Motor Activity Hyperactive  Appearance/Hygiene Unremarkable  Behavior Characteristics Cooperative;Appropriate to situation;Anxious;Resistant to care  Mood Anxious;Suspicious;Preoccupied;Pleasant  Thought Administrator, sports thinking  Content Ambivalence;Paranoia  Delusions Paranoid;Persecutory  Perception WDL  Hallucination None reported or observed  Judgment Poor  Confusion Mild  Danger to Self  Current suicidal ideation? Denies  Danger to Others  Danger to Others None reported or observed

## 2021-03-03 NOTE — Plan of Care (Signed)
  Problem: Education: Goal: Knowledge of [facility] Education information/materials will improve Outcome: Progressing Goal: Emotional status will improve Outcome: Progressing Goal: Mental status will improve Outcome: Progressing   

## 2021-03-03 NOTE — Progress Notes (Signed)
Adult Psychoeducational Group Note  Date:  03/03/2021 Time:  9:20 PM  Group Topic/Focus:  Wrap-Up Group:   The focus of this group is to help patients review their daily goal of treatment and discuss progress on daily workbooks.  Participation Level:  Active  Participation Quality:  Appropriate  Affect:  Appropriate  Cognitive:  Appropriate  Insight: Appropriate  Engagement in Group:  Developing/Improving  Modes of Intervention:  Discussion  Additional Comments:  Pt stated her goal for today was to focus on her treatment plan. Pt stated she accomplished her goal today. Pt stated she talked with her doctor and her social worker about her care today. Pt rated her overall day a 10. Pt stated she was able to contact her grandmother today which improved her overall day. Pt stated she felt better about herself tonight. Pt stated she was able to attend all groups held today. Pt stated she was able to attend all meals. Pt stated she took all medications provided today. Pt stated her appetite was pretty good today. Pt rated her sleep last night was pretty good. Pt stated the goal tonight was to get some rest. Pt stated she had no physical pain today. Pt deny visual hallucinations and auditory issues tonight. Pt denies thoughts of harming herself or others. Pt stated she would alert staff if anything changed.  Felipa Furnace 03/03/2021, 9:20 PM

## 2021-03-03 NOTE — BHH Group Notes (Signed)
[facility] LCSW Group Therapy Note  Date/Time:  03/03/2021  11:00AM-12:00PM  Type of Therapy and Topic:  Group Therapy:  Music and Mood  Participation Level:  Active   Description of Group: In this process group, members listened to a variety of genres of music and identified that different types of music evoke different responses.  Patients were encouraged to identify music that was soothing for them and music that was energizing for them.  Patients discussed how this knowledge can help with wellness and recovery in various ways including managing depression and anxiety as well as encouraging healthy sleep habits.    Therapeutic Goals: Patients will explore the impact of different varieties of music on mood Patients will verbalize the thoughts they have when listening to different types of music Patients will identify music that is soothing to them as well as music that is energizing to them Patients will discuss how to use this knowledge to assist in maintaining wellness and recovery Patients will explore the use of music as a coping skill  Summary of Patient Progress:  At the beginning of group, patient expressed that she felt "great" and she often talked after songs about people needing to think about the good in their lives instead of just focusing on the bad.  Throughout group, whether she knew the words to songs or not, she sang so loudly that others could not hear the songs.  CSW tried to get her to quieten her singing, but she did not do so.  At the end of group, patient expressed that she felt "all the way around awesome."    Therapeutic Modalities: Solution Focused Brief Therapy Activity   Ambrose Mantle, LCSW

## 2021-03-03 NOTE — Progress Notes (Signed)
Pt did not attend psycho-ed group. 
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
## 2021-03-04 MED ORDER — DIPHENHYDRAMINE HCL 50 MG/ML IJ SOLN
INTRAMUSCULAR | Status: AC
  Filled 2021-03-04: qty 1

## 2021-03-04 MED ORDER — LORAZEPAM 2 MG/ML IJ SOLN: 2.0000 mg | Freq: Four times a day (QID) | INTRAMUSCULAR | Status: DC | PRN

## 2021-03-04 MED ORDER — DIVALPROEX SODIUM 500 MG PO DR TAB
500.0000 mg | DELAYED_RELEASE_TABLET | Freq: Two times a day (BID) | ORAL | Status: DC
  Administered 2021-03-04: 500 mg via ORAL
  Filled 2021-03-04 (×5): qty 1

## 2021-03-04 MED ORDER — HYDROXYZINE HCL 25 MG PO TABS
25.0000 mg | ORAL_TABLET | Freq: Four times a day (QID) | ORAL | Status: DC | PRN
  Administered 2021-03-04 – 2021-03-06 (×3): 25 mg via ORAL
  Filled 2021-03-04: qty 1
  Filled 2021-03-04: qty 10
  Filled 2021-03-04 (×2): qty 1

## 2021-03-04 MED ORDER — OLANZAPINE 10 MG PO TBDP
10.0000 mg | ORAL_TABLET | Freq: Every day | ORAL | Status: DC
  Administered 2021-03-05 – 2021-03-06 (×2): 10 mg via ORAL
  Filled 2021-03-04 (×4): qty 1

## 2021-03-04 MED ORDER — DIPHENHYDRAMINE HCL 50 MG/ML IJ SOLN
50.0000 mg | Freq: Once | INTRAMUSCULAR | Status: AC
  Administered 2021-03-04: 50 mg via INTRAMUSCULAR
  Filled 2021-03-04: qty 1

## 2021-03-04 MED ORDER — OLANZAPINE 5 MG PO TBDP
15.0000 mg | ORAL_TABLET | Freq: Every day | ORAL | Status: DC
  Administered 2021-03-04: 15 mg via ORAL
  Filled 2021-03-04 (×3): qty 1

## 2021-03-04 MED ORDER — LORAZEPAM 1 MG PO TABS: 1.0000 mg | ORAL_TABLET | ORAL | Status: DC | PRN

## 2021-03-04 MED ORDER — LORAZEPAM 2 MG/ML IJ SOLN
INTRAMUSCULAR | Status: AC
  Administered 2021-03-04: 2 mg
  Filled 2021-03-04: qty 1

## 2021-03-04 MED ORDER — OLANZAPINE 5 MG PO TBDP: 5.0000 mg | ORAL_TABLET | Freq: Three times a day (TID) | ORAL | Status: DC | PRN

## 2021-03-04 MED ORDER — LORAZEPAM 1 MG PO TABS
1.0000 mg | ORAL_TABLET | Freq: Four times a day (QID) | ORAL | Status: DC | PRN
  Administered 2021-03-05: 1 mg via ORAL
  Filled 2021-03-04: qty 1

## 2021-03-04 MED ORDER — LITHIUM CARBONATE ER 450 MG PO TBCR
450.0000 mg | EXTENDED_RELEASE_TABLET | Freq: Two times a day (BID) | ORAL | Status: DC
  Administered 2021-03-04 – 2021-03-07 (×6): 450 mg via ORAL
  Filled 2021-03-04 (×10): qty 1

## 2021-03-04 MED ORDER — LORAZEPAM 2 MG/ML IJ SOLN
2.0000 mg | Freq: Once | INTRAMUSCULAR | Status: AC
  Administered 2021-03-04: 2 mg via INTRAMUSCULAR

## 2021-03-04 MED ORDER — LITHIUM CARBONATE ER 300 MG PO TBCR
300.0000 mg | EXTENDED_RELEASE_TABLET | Freq: Two times a day (BID) | ORAL | Status: DC
  Filled 2021-03-04 (×2): qty 1

## 2021-03-04 MED ORDER — ZIPRASIDONE MESYLATE 20 MG IM SOLR: 20.0000 mg | INTRAMUSCULAR | Status: DC | PRN

## 2021-03-04 NOTE — Progress Notes (Signed)
Recreation Therapy Notes  INPATIENT RECREATION THERAPY ASSESSMENT  Patient Details Name: Gina Hooper MRN: [id number] DOB: [birth year] Today's Date: 03/04/2021       Information Obtained From: Patient  Able to Participate in Assessment/Interview: Yes  Patient Presentation: Alert  Reason for Admission (Per Patient): Other (Comments) (Psychosis)  Patient Stressors: Relationship  Coping Skills:   Isolation, Sports, TV, Arguments, Aggression, Music, Exercise, Meditate, Deep Breathing, Talk, Art, Prayer, Read, Hot Bath/Shower  Leisure Interests (2+):  Exercise - Walking, Technical brewer - Other (Comment), Sports - Other (Comment) (Skating, Bowling, Sit out in nature)  Frequency of Recreation/Participation: Other (Comment) ("not in a while")  Awareness of Community Resources:  Yes  Community Resources:  Park, [employer], Other (Comment) Archivist)  Current Use: No  If no, Barriers?: Other (Comment) (Being distracted)  Expressed Interest in [employer] Information: No  [place] of Residence:  Engineer, technical sales  Patient Main Form of Transportation: Set designer  Patient Strengths:  Biomedical engineer on face; Music; Dancing; Church  Patient Identified Areas of Improvement:  "Not really"  Patient Goal for Hospitalization:  "keep being strong the strong black queen woman I am"  Current SI (including self-harm):  No  Current HI:  No  Current AVH: No  Staff Intervention Plan: Group Attendance, Collaborate with Interdisciplinary Treatment Team  Consent to Intern Participation: N/A     Caroll Rancher, LRT/CTRS   Lillia Abed, Luane Rochon A 03/04/2021, 11:59 AM

## 2021-03-04 NOTE — [facility] Group Notes (Signed)
[facility] LCSW Group Therapy  03/04/2021 3:10 PM  Type of Therapy:  Group Therapy  Participation Level:  Did Not Attend  Summary of Progress/Problems: Group topic was Maintaining Wellness.  CSW offered for patient to participate in group. Patient was sleeping.   Reigan Tolliver E Maragret Vanacker 03/04/2021, 3:10 PM

## 2021-03-04 NOTE — Progress Notes (Signed)
Adult Psychoeducational Group Note  Date:  03/04/2021 Time:  8:34 PM  Group Topic/Focus:  Wrap-Up Group:   The focus of this group is to help patients review their daily goal of treatment and discuss progress on daily workbooks.  Participation Level:  Did Not Attend  Participation Quality:   Did Not Attend  Affect:   Did Not Attend  Cognitive:   Did Not Attend  Insight: None  Engagement in Group:   Did Not Attend  Modes of Intervention:   Did Not Attend  Additional Comments:  Pt did not attend tonight wrap up group.  Felipa Furnace 03/04/2021, 8:34 PM

## 2021-03-04 NOTE — Progress Notes (Signed)
Patient has been up in the dayroom watching tv. She has been very calm and cooperative. Compliant with her medication. Safety maintained and support given.    03/03/21 2115  Psych Admission Type (Psych Patients Only)  Admission Status Involuntary  Psychosocial Assessment  Patient Complaints None  Eye Contact Fair  Facial Expression Animated;Anxious;Pensive  Affect Anxious;Appropriate to circumstance;Preoccupied  Speech Tangential  Interaction Cautious;Forwards little;Guarded  Motor Activity Hyperactive  Appearance/Hygiene Improved  Behavior Characteristics Cooperative;Appropriate to situation  Aggressive Behavior  Effect No apparent injury  Thought Process  Coherency Concrete thinking  Content Ambivalence;Paranoia  Delusions Paranoid;Persecutory  Perception WDL  Hallucination None reported or observed  Judgment Poor  Confusion Mild  Danger to Self  Current suicidal ideation? Denies  Danger to Others  Danger to Others None reported or observed
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
## 2021-03-04 NOTE — Progress Notes (Signed)
Recreation Therapy Notes  Date: 7.11.22 Time: 1000 Location: 500 Hall Dayroom   Group Topic: Coping Skills   Goal Area(s) Addresses: Patient will define what a coping skill is. Patient will work to create a list of healthy coping skills beginning with each letter of the alphabet. Patient will successfully identify positive coping skills they can use post d/c.  Patient will acknowledge benefit(s) of using learned coping skills post d/c.  Behavioral Response: Engaged   Intervention: Worksheets, Pencils   Activity: Coping A to Z. Patient asked to identify what a coping skill is and when they use them. Patients with Clinical research associate discussed healthy versus unhealthy coping skills. Next patients were given a blank worksheet titled "Coping Skills A-Z". Patients were instructed to come up with at least one positive coping skill per letter of the alphabet, addressing a specific challenge (ex: stress, anger, anxiety, depression, grief, doubt, isolation, self-harm/suicidal thoughts, substance use). Patients were given 15 minutes to brainstorm, before ideas were presented to the large group. Patients and LRT debriefed on the importance of coping skill selection based on situation and back-up plans when a skill tried is not effective.    Education: Pharmacologist, Scientist, physiological, Discharge Planning.    Education Outcome: Acknowledges education/Verbalizes understanding/In group clarification offered/Additional education needed   Clinical Observations/Feedback: Pt was appropriate and attentive during group session.  Pt was to come up with coping skills to use for anger.  Pt came up with coping skills like walking, laughing, Engineering geologist, football, basketball, skating, go to the park and freedom.  During discussion, pt stated she uses all of her coping skills.  Pt expressed self esteem helps to use coping skills.  Pt stated self esteem was important because it helps "keep up your pride and believing in yourself will keep  you from failing".     Caroll Rancher, LRT/CTRS     Caroll Rancher A 03/04/2021 11:14 AM
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
## 2021-03-05 MED ORDER — ALBUTEROL SULFATE HFA 108 (90 BASE) MCG/ACT IN AERS: 2.0000 | INHALATION_SPRAY | Freq: Four times a day (QID) | RESPIRATORY_TRACT | Status: DC | PRN

## 2021-03-05 MED ORDER — DIPHENHYDRAMINE HCL 25 MG PO CAPS: 25.0000 mg | ORAL_CAPSULE | Freq: Three times a day (TID) | ORAL | Status: DC | PRN

## 2021-03-05 MED ORDER — HALOPERIDOL LACTATE 5 MG/ML IJ SOLN: 5.0000 mg | Freq: Three times a day (TID) | INTRAMUSCULAR | Status: DC | PRN

## 2021-03-05 MED ORDER — OLANZAPINE 10 MG PO TBDP
10.0000 mg | ORAL_TABLET | Freq: Every day | ORAL | Status: DC
  Administered 2021-03-05: 10 mg via ORAL
  Filled 2021-03-05 (×3): qty 1

## 2021-03-05 MED ORDER — HALOPERIDOL 5 MG PO TABS: 5.0000 mg | ORAL_TABLET | Freq: Three times a day (TID) | ORAL | Status: DC | PRN

## 2021-03-05 MED ORDER — DIPHENHYDRAMINE HCL 50 MG/ML IJ SOLN: 50.0000 mg | Freq: Three times a day (TID) | INTRAMUSCULAR | Status: DC | PRN

## 2021-03-05 NOTE — Plan of Care (Signed)
  Problem: Education: Goal: Verbalization of understanding the information provided will improve Outcome: Progressing   Problem: Activity: Goal: Interest or engagement in activities will improve Outcome: Progressing Goal: Sleeping patterns will improve Outcome: Progressing   

## 2021-03-05 NOTE — Progress Notes (Addendum)
Pt visible on the unit this evening, pt stated she was doing better. Pt given PRN  Trazodone and Vistaril per MAR with HS medication    03/05/21 2200  Psych Admission Type (Psych Patients Only)  Admission Status Involuntary  Psychosocial Assessment  Patient Complaints Anxiety  Eye Contact Fair  Facial Expression Anxious  Affect Anxious;Appropriate to circumstance  Speech Logical/coherent  Interaction Assertive  Motor Activity Slow  Appearance/Hygiene Unremarkable  Behavior Characteristics Cooperative  Mood Anxious  Thought Process  Coherency Concrete thinking  Content Paranoia  Delusions Paranoid  Perception WDL  Hallucination None reported or observed  Judgment Poor  Confusion None  Danger to Self  Current suicidal ideation? Denies  Danger to Others  Danger to Others None reported or observed
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
## 2021-03-05 NOTE — Progress Notes (Signed)
Progress note  Pt found in bed; compliant with medication administration. Pt seems less agitated and angry today. Pt has been reclusive to their room. When pt is in the dayroom, pt is minimal with others and staff. Pt is pleasant. Pt denies si/hi/ah/vh and verbally agrees to approach staff if these become apparent or before harming themselves/others while at [facility]. Pt does seem preoccupied/paranoid at times though. A: Pt provided support and encouragement. Pt given medication per protocol and standing orders. Q34m safety checks implemented and continued.  R: Pt safe on the unit. Will continue to monitor.

## 2021-03-05 NOTE — Progress Notes (Signed)
Recreation Therapy Notes  Date: 7.12.22 Time: 1000 Location: 500 Hall Dayroom  Group Topic: Wellness  Goal Area(s) Addresses:  Patient will define components of whole wellness. Patient will verbalize benefit of whole wellness.  Intervention: Music  Activity: Exercise.  LRT led patients in a series of stretches to get their muscles loosened up.  Patients would then take turns leading the group in exercises of their choosing.  The activity was to be at least 30 minutes long.  Patients were encouraged to take breaks if needed and to drink water if needed also.  LRT also spoke with patients about the three elements of wellness and how they all work together.    Education: Wellness, Discharge Planning.   Education Outcome: Acknowledges education/In group clarification offered/Needs additional education.   Clinical Observations/Feedback: Pt did not attend group.     Bingham Millette, LRT/CTRS        Ollen Rao A 03/05/2021 12:14 PM 

## 2021-03-05 NOTE — Progress Notes (Signed)
Pt did not attend orientation/goals group. 

## 2021-03-05 NOTE — Progress Notes (Signed)
Adult Psychoeducational Group Note  Date:  03/05/2021 Time:  9:23 PM  Group Topic/Focus:  Wrap-Up Group:   The focus of this group is to help patients review their daily goal of treatment and discuss progress on daily workbooks.  Participation Level:  Active  Participation Quality:  Appropriate  Affect:  Appropriate  Cognitive:  Oriented  Insight: Appropriate  Engagement in Group:  Engaged  Modes of Intervention:  Education  Additional Comments:  Patient attended and participated in group tonight. She reports that nothing happened today that was umportant to her.  Lita Mains [facility] 03/05/2021, 9:23 PM

## 2021-03-05 NOTE — Progress Notes (Signed)
   03/04/21 2045  Psych Admission Type (Psych Patients Only)  Admission Status Involuntary  Psychosocial Assessment  Patient Complaints None  Eye Contact Fair  Facial Expression Animated  Affect Appropriate to circumstance  Speech Logical/coherent  Interaction Assertive;Minimal  Motor Activity Pacing  Appearance/Hygiene Improved  Behavior Characteristics Cooperative;Appropriate to situation  Mood Anxious;Pleasant  Thought Process  Coherency Concrete thinking  Content Paranoia;Delusions  Delusions Paranoid;Persecutory  Perception WDL  Hallucination None reported or observed  Judgment Poor  Confusion Mild  Danger to Self  Current suicidal ideation? Denies  Danger to Others  Danger to Others None reported or observed

## 2021-03-06 LAB — COMPREHENSIVE METABOLIC PANEL
ALT: 18 U/L (ref 0–44)
AST: 31 U/L (ref 15–41)
Albumin: 4.3 g/dL (ref 3.5–5.0)
Alkaline Phosphatase: 87 U/L (ref 38–126)
Anion gap: 7 (ref 5–15)
BUN: 12 mg/dL (ref 6–20)
CO2: 27 mmol/L (ref 22–32)
Calcium: 9.7 mg/dL (ref 8.9–10.3)
Chloride: 104 mmol/L (ref 98–111)
Creatinine, Ser: 0.76 mg/dL (ref 0.44–1.00)
GFR, Estimated: >60 mL/min (ref >60)
Glucose, Bld: 78 mg/dL (ref 70–99)
Potassium: 3.8 mmol/L (ref 3.5–5.1)
Sodium: 138 mmol/L (ref 135–145)
Total Bilirubin: 0.4 mg/dL (ref 0.3–1.2)
Total Protein: 8 g/dL (ref 6.5–8.1)

## 2021-03-06 LAB — LITHIUM LEVEL: Lithium Lvl: 0.45 mmol/L — ABNORMAL LOW (ref 0.60–1.20)

## 2021-03-06 MED ORDER — OLANZAPINE 5 MG PO TBDP
15.0000 mg | ORAL_TABLET | Freq: Every day | ORAL | Status: DC
  Administered 2021-03-07: 15 mg via ORAL
  Filled 2021-03-06 (×2): qty 1

## 2021-03-06 NOTE — Tx Team (Addendum)
Interdisciplinary Treatment and Diagnostic Plan Update  03/06/2021 Time of Session: 9:55am Gina Hooper MRN: [id number]  Principal Diagnosis: Bipolar I disorder, current or most recent episode manic, with psychotic features (HCC)  Secondary Diagnoses: Principal Problem:   Bipolar I disorder, current or most recent episode manic, with psychotic features (HCC) Active Problems:   Polysubstance abuse (HCC)   Current Medications:  Current Facility-Administered Medications  Medication Dose Route Frequency Provider Last Rate Last Admin   acetaminophen (TYLENOL) tablet 650 mg  650 mg Oral Q6H PRN Lenard Lance, FNP       albuterol (VENTOLIN HFA) 108 (90 Base) MCG/ACT inhaler 2 puff  2 puff Inhalation Q6H PRN Claudie Revering, MD       alum & mag hydroxide-simeth (MAALOX/MYLANTA) 200-200-20 MG/5ML suspension 30 mL  30 mL Oral Q4H PRN Lenard Lance, FNP       diphenhydrAMINE (BENADRYL) capsule 25 mg  25 mg Oral Q8H PRN Claudie Revering, MD       Or   diphenhydrAMINE (BENADRYL) injection 50 mg  50 mg Intramuscular Q8H PRN Claudie Revering, MD       haloperidol (HALDOL) tablet 5 mg  5 mg Oral Q8H PRN Claudie Revering, MD       Or   haloperidol lactate (HALDOL) injection 5 mg  5 mg Intramuscular Q8H PRN Claudie Revering, MD       hydrOXYzine (ATARAX/VISTARIL) tablet 25 mg  25 mg Oral Q6H PRN Claudie Revering, MD   25 mg at 03/05/21 2039   lithium carbonate (ESKALITH) CR tablet 450 mg  450 mg Oral Q12H Claudie Revering, MD   450 mg at 03/06/21 0733   LORazepam (ATIVAN) tablet 1 mg  1 mg Oral Q6H PRN Claudie Revering, MD   1 mg at 03/05/21 5638   Or   LORazepam (ATIVAN) injection 2 mg  2 mg Intramuscular Q6H PRN Claudie Revering, MD       magnesium hydroxide (MILK OF MAGNESIA) suspension 30 mL  30 mL Oral Daily PRN Lenard Lance, [name], MD   1 tablet at 03/05/21 0737   OLANZapine zydis (ZYPREXA) disintegrating tablet 10  mg  10 mg Oral Daily Claudie Revering, MD   10 mg at 03/06/21 0732   OLANZapine zydis (ZYPREXA) disintegrating tablet 10 mg  10 mg Oral QHS Claudie Revering, MD   10 mg at 03/05/21 2039   thiamine (B-1) injection 100 mg  100 mg Intramuscular Once Claudie Revering, [name], MD   100 mg at 03/06/21 0732   traZODone (DESYREL) tablet 50 mg  50 mg Oral QHS PRN Lenard Lance, FNP   50 mg at 03/05/21 2039   PTA Medications: Medications Prior to Admission  Medication Sig Dispense Refill Last Dose   QUEtiapine (SEROQUEL) 50 MG tablet Take 1 tablet (50 mg total) by mouth at bedtime. 30 tablet 0     Patient Stressors: Medication change or noncompliance Substance abuse  Patient Strengths: General fund of knowledge Supportive family/friends  Treatment Modalities: Medication Management, Group therapy, Case management,  1 to 1 session with clinician, Psychoeducation, Recreational therapy.   Physician Treatment Plan for Primary Diagnosis: Bipolar I disorder, current or most recent episode manic, with psychotic features (HCC)  Long Term Goal(s): Improvement in symptoms so as ready for discharge   Short Term Goals: Ability to identify changes in lifestyle to reduce recurrence of condition will improve Ability to verbalize feelings will improve Ability to disclose and discuss suicidal ideas Ability to demonstrate self-control will improve Ability to identify and develop effective coping behaviors will improve Ability to maintain clinical measurements within normal limits will improve Compliance with prescribed medications will improve Ability to identify triggers associated with substance abuse/mental health issues will improve  Medication Management: Evaluate patient's response, side effects, and tolerance of medication regimen.  Therapeutic Interventions: 1 to 1 sessions, Unit Group sessions and Medication administration.  Evaluation of  Outcomes: Progressing  Physician Treatment Plan for Secondary Diagnosis: Principal Problem:   Bipolar I disorder, current or most recent episode manic, with psychotic features (HCC) Active Problems:   Polysubstance abuse (HCC)  Long Term Goal(s): Improvement in symptoms so as ready for discharge   Short Term Goals: Ability to identify changes in lifestyle to reduce recurrence of condition will improve Ability to verbalize feelings will improve Ability to disclose and discuss suicidal ideas Ability to demonstrate self-control will improve Ability to identify and develop effective coping behaviors will improve Ability to maintain clinical measurements within normal limits will improve Compliance with prescribed medications will improve Ability to identify triggers associated with substance abuse/mental health issues will improve     Medication Management: Evaluate patient's response, side effects, and tolerance of medication regimen.  Therapeutic Interventions: 1 to 1 sessions, Unit Group sessions and Medication administration.  Evaluation of Outcomes: Progressing   RN Treatment Plan for Primary Diagnosis: Bipolar I disorder, current or most recent episode manic, with psychotic features (HCC) Long Term Goal(s): Knowledge of disease and therapeutic regimen to maintain health will improve  Short Term Goals: Ability to remain free from injury will improve, Ability to verbalize frustration and anger appropriately will improve, Ability to identify and develop effective coping behaviors will improve, and Compliance with prescribed medications will improve  Medication Management: RN will administer medications as ordered by provider, will assess and evaluate patient's response and provide education to patient for prescribed medication. RN will report any adverse and/or side effects to prescribing provider.  Therapeutic Interventions: 1 on 1 counseling sessions, Psychoeducation, Medication  administration, Evaluate responses to treatment, Monitor vital signs and CBGs as ordered, Perform/monitor CIWA, COWS, AIMS and Fall Risk screenings as ordered, Perform wound care treatments as ordered.  Evaluation of Outcomes: Progressing   LCSW Treatment Plan for Primary Diagnosis: Bipolar I disorder, current or most recent episode manic, with psychotic features (HCC) Long Term Goal(s): Safe transition to appropriate next level of care at discharge, Engage patient in therapeutic group addressing interpersonal concerns.  Short Term Goals: Engage patient in aftercare planning with referrals and resources, Increase social support, Increase ability to appropriately verbalize feelings, Facilitate patient progression through stages of change regarding substance use diagnoses and concerns, Identify triggers associated with mental health/substance abuse issues, and Increase skills for wellness and recovery  Therapeutic Interventions: Assess for all discharge needs, 1 to 1 time with Social worker, Explore available resources and support systems, Assess for adequacy in community support network, Educate family and significant other(s) on suicide prevention, Complete Psychosocial Assessment, Interpersonal group therapy.  Evaluation of Outcomes: Progressing   Progress in Treatment: Attending groups: Yes. Participating in groups: Yes. Taking medication as prescribed: Yes. Toleration medication: Yes. Family/Significant other contact made: Yes, individual(s) contacted:  Grandmother  Patient understands diagnosis: Yes. Discussing patient identified problems/goals  with staff: Yes. Medical problems stabilized or resolved: Yes. Denies suicidal/homicidal ideation: Yes. Issues/concerns per patient self-inventory: No.   New problem(s) identified: No, Describe:  none  New Short Term/Long Term Goal(s): medication stabilization, elimination of SI thoughts, development of comprehensive mental wellness plan.    Patient Goals:  did not attend  Discharge Plan or Barriers: Patient recently admitted. CSW will continue to follow and assess for appropriate referrals and possible discharge planning.    Reason for Continuation of Hospitalization: Anxiety Depression Hallucinations Medication stabilization Suicidal ideation  Estimated Length of Stay: 3-5 days  Attendees: Patient: Did not attend 03/06/2021   Physician: Rhea Belton, DO 03/06/2021   Nursing:  03/06/2021   RN Care Manager: 03/06/2021   Social Worker: Melba Coon, LCSWA 03/06/2021   Recreational Therapist:  03/06/2021   Other:  03/06/2021   Other:  03/06/2021   Other: 03/06/2021     Scribe for Treatment Team: Aram Beecham, LCSWA 03/06/2021 11:21 AM

## 2021-03-06 NOTE — Progress Notes (Signed)
Adult Psychoeducational Group Note  Date:  03/06/2021 Time:  9:22 PM  Group Topic/Focus:  Wrap-Up Group:   The focus of this group is to help patients review their daily goal of treatment and discuss progress on daily workbooks.  Participation Level:  Active  Participation Quality:  Appropriate  Affect:  Labile  Cognitive:  Oriented  Insight: Limited  Engagement in Group:  Engaged  Modes of Intervention:  Education  Additional Comments:  Patient attended and participated in group tonight.She reports that she like her smile, while she has been here, she learnt about self esteem.  Lita Mains [facility] 03/06/2021, 9:22 PM

## 2021-03-06 NOTE — Progress Notes (Signed)
Recreation Therapy Notes  Date: 7.13.22 Time: 1000 Location: 500 Hall Dayroom  Group Topic: Communication  Goal Area(s) Addresses:  Patient will effectively communicate with peers in group.  Patient will verbalize benefit of healthy communication. Patient will verbalize positive effect of healthy communication on post d/c goals.   Behavioral Response: Engaged, Attentive  Intervention: Geometrical Drawings, Pencils, Blank Paper  Activity: LRT and patients talked about the various ways (cell phone, texting, e-mail, body language, eye contact, etc) in which people communicate with each other. Patients also talked about how communication can affect different areas of our lives.  Three volunteers would come to the front of the room one at a time.  Each person was given a picture to describe to the rest of the group.  The presenters were to be as descriptive as possible and the listeners could only ask for the presenter to repeat themselves.  They could not ask any descriptive questions.    Education: Communication, Discharge Planning  Education Outcome: Acknowledges understanding/In group clarification offered/Needs additional education.   Clinical Observations/Feedback: Pt was attentive and appropriate during group session.  Pt identified eye contact as a form of communicating.  Pt was lacking descriptive details about the picture she was describing to peers.  During discussion, pt expressed eye contact can be misinterpreted as not listening when not making direct eye contact.  Pt went on to say "this helps the other person know/feel you're listening to them".  However, pt went on to say even when a person is giving eye contact, they still could not be listening to you.  Pt also expressed individuals should be mindful of what is sound around certain people "you can't trust everybody".      Caroll Rancher, LRT/CTRS    Lillia Abed, Danetra Glock A 03/06/2021 1:01 PM

## 2021-03-06 NOTE — Progress Notes (Signed)
Progress note  Pt found in bed; compliant with medication administration. Pt denies any physical complaints or pain. Pt continues to have moments of anxiety/anger, but usually this is after phone calls. Pt is pleasant with peers and staff. Pt seen singing in the dayroom to little mermaid. Pt denies si/hi/ah/vh and verbally agrees to approach staff if these become apparent or before harming themselves/others while at [facility].  A: Pt provided support and encouragement. Pt given medication per protocol and standing orders. Q82m safety checks implemented and continued.  R: Pt safe on the unit. Will continue to monitor. \
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
## 2021-03-06 NOTE — Plan of Care (Signed)
  Problem: Coping: Goal: Ability to verbalize frustrations and anger appropriately will improve Outcome: Progressing Goal: Ability to demonstrate self-control will improve Outcome: Progressing   Problem: Health Behavior/Discharge Planning: Goal: Identification of resources available to assist in meeting health care needs will improve Outcome: Progressing   

## 2021-03-06 NOTE — [facility] Group Notes (Signed)
Type of Therapy and Topic:  Group Therapy:  Positive Affirmations   Participation Level:  Active   Description of Group: This group addressed positive affirmation toward self and others. Patients went around the room and identified two positive things about themselves and two positive things about a peer in the room. Patients reflected on how it felt to share something positive with others, to identify positive things about themselves, and to hear positive things from others. Patients were encouraged to have a daily reflection of positive characteristics or circumstances. Therapeutic Goals Patient will verbalize two of their positive qualities Patient will demonstrate empathy for others by stating two positive qualities about a peer in the group Patient will verbalize their feelings when voicing positive self affirmations and when voicing positive affirmations of others Patients will discuss the potential positive impact on their wellness/recovery of focusing on positive traits of self and others.   Summary of Patient Progress: Due to the acuity and complex discharge plans, group was not held. Patient was provided therapeutic worksheets and asked to meet with CSW as needed. 

## 2021-03-06 NOTE — Progress Notes (Signed)
Pt visible on the unit this evening, pt given PRN Trazodone and Vistaril per [facility] with HS medication    03/06/21 2200  Psych Admission Type (Psych Patients Only)  Admission Status Involuntary  Psychosocial Assessment  Patient Complaints Anxiety  Eye Contact Fair  Facial Expression Anxious;Pensive  Affect Anxious;Preoccupied  Speech Logical/coherent  Interaction Assertive  Motor Activity Slow  Appearance/Hygiene Unremarkable  Behavior Characteristics Cooperative  Mood Anxious;Pleasant  Thought Administrator, sports thinking  Content Paranoia  Delusions Paranoid  Perception Derealization  Hallucination None reported or observed  Judgment Poor  Confusion None  Danger to Self  Current suicidal ideation? Denies  Danger to Others  Danger to Others None reported or observed

## 2021-03-07 MED ORDER — ALBUTEROL SULFATE HFA 108 (90 BASE) MCG/ACT IN AERS: 2.0000 | INHALATION_SPRAY | Freq: Four times a day (QID) | RESPIRATORY_TRACT | 0 refills | Status: AC | PRN

## 2021-03-07 MED ORDER — TRAZODONE HCL 50 MG PO TABS: 50.0000 mg | ORAL_TABLET | Freq: Every evening | ORAL | 0 refills | Status: DC | PRN

## 2021-03-07 MED ORDER — LITHIUM CARBONATE ER 300 MG PO TBCR
300.0000 mg | EXTENDED_RELEASE_TABLET | Freq: Two times a day (BID) | ORAL | Status: DC
  Filled 2021-03-07 (×4): qty 14

## 2021-03-07 MED ORDER — LITHIUM CARBONATE ER 300 MG PO TBCR: 300.0000 mg | EXTENDED_RELEASE_TABLET | Freq: Two times a day (BID) | ORAL | 0 refills | Status: DC

## 2021-03-07 MED ORDER — HYDROXYZINE HCL 25 MG PO TABS: 25.0000 mg | ORAL_TABLET | Freq: Four times a day (QID) | ORAL | 0 refills | Status: DC | PRN

## 2021-03-07 MED ORDER — OLANZAPINE 7.5 MG PO TABS
15.0000 mg | ORAL_TABLET | Freq: Every day | ORAL | Status: DC
  Filled 2021-03-07 (×2): qty 14

## 2021-03-07 MED ORDER — OLANZAPINE 15 MG PO TABS: 15.0000 mg | ORAL_TABLET | Freq: Every day | ORAL | 0 refills | Status: DC
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
## 2021-03-07 NOTE — Progress Notes (Signed)
Recreation Therapy Notes  INPATIENT RECREATION TR PLAN  Patient Details Name: Gina Hooper MRN: [id number] DOB: May 07, 1991 Today's Date: 03/07/2021  Rec Therapy Plan Is patient appropriate for Therapeutic Recreation?: Yes Treatment times per week: about 3 days Estimated Length of Stay: 5-7 days TR Treatment/Interventions: Group participation (Comment)  Discharge Criteria Pt will be discharged from therapy if:: Discharged Treatment plan/goals/alternatives discussed and agreed upon by:: Patient/family  Discharge Summary Short term goals set: See patient care plan Short term goals met: Complete Progress toward goals comments: Groups attended Which groups?: Self-esteem, Coping skills, Communication Reason goals not met: None Therapeutic equipment acquired: N/A Reason patient discharged from therapy: Discharge from hospital Pt/family agrees with progress & goals achieved: Yes Date patient discharged from therapy: 03/07/21    Victorino Sparrow, LRT/CTRS   Ria Comment, Devondre Guzzetta A 03/07/2021, 11:52 AM

## 2021-03-07 NOTE — Progress Notes (Signed)
  [facility] Adult Case Management Discharge Plan :  Will you be returning to the same living situation after discharge:  Yes,  Home  At discharge, do you have transportation home?: Yes,  Fiance  Do you have the ability to pay for your medications: Yes,  Medicaid   Release of information consent forms completed and in the chart;  Patient's signature needed at discharge.  Patient to Follow up at:  Follow-up Information     [facility]. Go on 03/28/2021.   Specialty: Behavioral Health Why: You have an appointment for medication management services on 03/28/21 at 8:00 am.  This appointment will be held in person.  For therapy services, please go during walk in hours:  Monday through Wednesday from 8:00 am to 11:00 am. Contact information: [address] [place] [address] [phone]        [facility]. Go to.   Why: Please go to this provider for substance abuse intensive outpatient therapy services during the walk in hours:  Monday through Friday, from 6:00 am to 10:30 am. Contact information: [address] [place], [place] [address]  P: [phone] F: [phone]                Next level of care provider has access to [facility] Link:yes  Safety Planning and Suicide Prevention discussed: Yes,  with patient and grandmother   Have you used any form of tobacco in the last 30 days? (Cigarettes, Smokeless Tobacco, Cigars, and/or Pipes): Yes  Has patient been referred to the Quitline?: Patient refused referral  Patient has been referred for addiction treatment: Yes  Aram Beecham, LCSWA 03/07/2021, 9:43 AM

## 2021-03-07 NOTE — Plan of Care (Signed)
Patient was able to focus on task/topic with no prompts from staff within 5 recreation therapy group sessions.   Caroll Rancher, LRT/CTRS

## 2021-03-07 NOTE — [facility] Group Notes (Signed)
[facility] Group Notes:  (Nursing/MHT/Case Management/Adjunct)  Date:  03/07/2021  Time:  11:23 AM  Type of Therapy:  Group Therapy  Participation Level:  Active  Participation Quality:  Appropriate  Affect:  Appropriate  Cognitive:  Appropriate  Insight:  Appropriate  Engagement in Group:  Engaged  Modes of Intervention:  Orientation  Summary of Progress/Problems: Pt goal for today is to continue to go to groups and try to get discharged.   Gina Hooper J Herschell Virani 03/07/2021, 11:23 AM

## 2021-03-07 NOTE — Progress Notes (Signed)
Recreation Therapy Notes  Date: 7.14.22 Time: 1000 Location: [address]  Group Topic: Self-Esteem  Goal Area(s) Addresses:  Patient will successfully identify positive attributes about themselves.  Patient will successfully identify benefit of improved self-esteem.   Behavioral Response: Engaged  Intervention: Blank Face Sheet, Markers  Activity: How I See Me.  Patients were to choose a blank outline of a face that represents them.  Patients were to design an image highlighting the positive things about themselves.  Patients could use words to go along with the art to express their feelings.  Patients and LRT discussed the drawings at during discussion.  Education:  Self-Esteem, Discharge Planning  Education Outcome: Acknowledges education/In group clarification offered/Needs additional education  Clinical Observations/Feedback: Pt expressed self esteem can be influenced by "circumstances".  Pt was attentive and bright during group session.  Pt identified herself with words like "loyal, shield, embrace, very minded, powerful, strong person, joyful, caring, heart of gold, confident, special, loving and high self-esteem".  Pt was very engaging and expressed interest in the drawings of peers.  Pt was focused throughout group session.    Caroll Rancher, LRT/CTRS     Caroll Rancher A 03/07/2021 11:23 AM

## 2021-03-07 NOTE — [facility] Suicide Risk Assessment (Signed)
[facility] Discharge Suicide Risk Assessment   Principal Problem: Bipolar I disorder, current or most recent episode manic, with psychotic features [facility]) Discharge Diagnoses: Principal Problem:   Bipolar I disorder, current or most recent episode manic, with psychotic features (HCC) Active Problems:   Polysubstance abuse (HCC)   Total Time spent with patient: 20 minutes  Musculoskeletal: Strength & Muscle Tone: within normal limits Gait & Station: normal Patient leans: N/A  Psychiatric Specialty Exam  Presentation  General Appearance: Appropriate for Environment; Neat  Eye Contact:Good  Speech:Clear and Coherent; Normal Rate  Speech Volume:Normal  Handedness:Right   Mood and Affect  Mood:Euthymic  Duration of Depression Symptoms: No data recorded Affect:Appropriate; Congruent   Thought Process  Thought Processes:Coherent; Goal Directed; Linear  Descriptions of Associations:Intact  Orientation:Full (Time, Place and Person)  Thought Content:Logical  History of Schizophrenia/Schizoaffective disorder:No  Duration of Psychotic Symptoms:Less than six months  Hallucinations:Hallucinations: None  Ideas of Reference:None  Suicidal Thoughts:Suicidal Thoughts: No  Homicidal Thoughts:Homicidal Thoughts: No   Sensorium  Memory:Immediate Good; Recent Good  Judgment:Good  Insight:Good   Executive Functions  Concentration:Good  Attention Span:Good  Recall:Good  Fund of Knowledge:Good  Language:Good   Psychomotor Activity  Psychomotor Activity:Psychomotor Activity: Normal   Assets  Assets:Communication Skills; Desire for Improvement; Housing; Resilience; Social Support; Leisure Time   Sleep  Sleep:Sleep: Good    Physical Exam: Physical Exam Vitals and nursing note reviewed.  Constitutional:      General: She is not in acute distress. HENT:     Head: Normocephalic and atraumatic.  Cardiovascular:     Rate and Rhythm: Normal rate.   Pulmonary:     Effort: Pulmonary effort is normal.  Neurological:     General: No focal deficit present.     Mental Status: She is alert and oriented to person, place, and time.   Review of Systems  Constitutional: Negative.   HENT: Negative.    Eyes: Negative.   Respiratory: Negative.    Cardiovascular: Negative.   Gastrointestinal:  Negative for constipation, diarrhea, nausea and vomiting.  Musculoskeletal: Negative.   Neurological:  Negative for dizziness and tremors.  Psychiatric/Behavioral:  Negative for depression, hallucinations and suicidal ideas. The patient is not nervous/anxious.   All other systems reviewed and are negative.    Blood pressure 108/69, pulse 76, temperature 98.7 F (37.1 C), temperature source Oral, resp. rate 16, height 5\' 5"  (1.651 m), weight 66.7 kg, SpO2 100 %. Body mass index is 24.46 kg/m.  Mental Status Per Nursing Assessment::   On Admission:  NA  Demographic Factors:  NA  Loss Factors: NA  Historical Factors: Impulsivity  Risk Reduction Factors:   Sense of responsibility to family, Living with another person, especially a relative, and Positive social support  Continued Clinical Symptoms:  Bipolar Disorder - currently euthymic Previous Psychiatric Diagnoses and Treatments  Cognitive Features That Contribute To Risk:  None    Suicide Risk:  Minimal acute risk: No identifiable suicidal ideation.  Patients presenting with no risk factors but with morbid ruminations; may be classified as minimal risk based on the severity of the depressive symptoms   Follow-up Information     [facility]. Go on 03/28/2021.   Specialty: Behavioral Health Why: You have an appointment for medication management services on 03/28/21 at 8:00 am.  This appointment will be held in person.  For therapy services, please go during walk in hours:  Monday through Wednesday from 8:00 am to 11:00 am. Contact information: [phone] [place] [address] [phone]        ADS. Go to.   Why: Please go to this provider for substance abuse intensive outpatient therapy services during the walk in hours:  Monday through Friday, from 6:00 am to 10:30 am. Contact information: [address] [place], [place] [address]  P: [phone] F: [phone]                Plan Of Care/Follow-up recommendations:  Activity:  as tolerated  Tests:   -You will periodically need to have blood drawn for lab work to monitor cholesterol and blood sugar levels while taking olanzapine/Zyprexa.   -You also will periodically need to have blood drawn for lab work to monitor lithium level, kidney function and thyroid function while taking lithium.   -Your outpatient doctor will let you know when lab work needs to be performed.  Other:   -Take medications as prescribed.   -Do not take ibuprofen, aspirin, naproxen or other nonsteroidal anti-inflammatory drugs.   -Do not drink alcohol.  Do not use marijuana/cannabis or other drugs.   -Attend outpatient substance abuse treatment program and 12-step groups.   -Keep outpatient mental health follow-up appointments with therapist and psychiatrist.  -See your primary care provider for treatment of medical conditions.  Claudie Revering, MD 03/07/2021, 10:04 AM

## 2021-03-12 ENCOUNTER — Encounter (HOSPITAL_COMMUNITY): Payer: Self-pay | Admitting: Psychiatry

## 2021-03-12 ENCOUNTER — Other Ambulatory Visit: Payer: Self-pay

## 2021-03-12 ENCOUNTER — Inpatient Hospital Stay (HOSPITAL_COMMUNITY)
Admission: RE | Admit: 2021-03-12 | Discharge: 2021-03-22 | DRG: 885 | Disposition: A | Discharge status: Home / Self Care | Payer: Medicaid Other | Attending: Behavioral Health | Admitting: Behavioral Health

## 2021-03-12 DIAGNOSIS — F600 Paranoid personality disorder: Secondary | ICD-10-CM | POA: Diagnosis present

## 2021-03-12 DIAGNOSIS — F3162 Bipolar disorder, current episode mixed, moderate: Secondary | ICD-10-CM | POA: Diagnosis present

## 2021-03-12 DIAGNOSIS — F17210 Nicotine dependence, cigarettes, uncomplicated: Secondary | ICD-10-CM | POA: Diagnosis present

## 2021-03-12 DIAGNOSIS — F1491 Cocaine use, unspecified, in remission: Secondary | ICD-10-CM | POA: Diagnosis present

## 2021-03-12 DIAGNOSIS — Z20822 Contact with and (suspected) exposure to covid-19: Secondary | ICD-10-CM | POA: Diagnosis present

## 2021-03-12 DIAGNOSIS — F1021 Alcohol dependence, in remission: Secondary | ICD-10-CM | POA: Diagnosis present

## 2021-03-12 DIAGNOSIS — F319 Bipolar disorder, unspecified: Secondary | ICD-10-CM | POA: Insufficient documentation

## 2021-03-12 DIAGNOSIS — F312 Bipolar disorder, current episode manic severe with psychotic features: Secondary | ICD-10-CM | POA: Diagnosis present

## 2021-03-12 DIAGNOSIS — F19959 Other psychoactive substance use, unspecified with psychoactive substance-induced psychotic disorder, unspecified: Secondary | ICD-10-CM | POA: Diagnosis not present

## 2021-03-12 DIAGNOSIS — G4700 Insomnia, unspecified: Secondary | ICD-10-CM | POA: Diagnosis present

## 2021-03-12 DIAGNOSIS — F1910 Other psychoactive substance abuse, uncomplicated: Secondary | ICD-10-CM | POA: Diagnosis present

## 2021-03-12 DIAGNOSIS — R45850 Homicidal ideations: Secondary | ICD-10-CM | POA: Diagnosis present

## 2021-03-12 DIAGNOSIS — F14259 Cocaine dependence with cocaine-induced psychotic disorder, unspecified: Secondary | ICD-10-CM | POA: Diagnosis present

## 2021-03-12 DIAGNOSIS — F1420 Cocaine dependence, uncomplicated: Secondary | ICD-10-CM | POA: Diagnosis present

## 2021-03-12 DIAGNOSIS — Z9114 Patient's other noncompliance with medication regimen: Secondary | ICD-10-CM

## 2021-03-12 HISTORY — DX: Bipolar disorder, unspecified: F31.9

## 2021-03-12 LAB — RAPID URINE DRUG SCREEN, HOSP PERFORMED
Amphetamines: NOT DETECTED
Barbiturates: NOT DETECTED
Benzodiazepines: NOT DETECTED
Cocaine: POSITIVE — AB
Opiates: NOT DETECTED
Tetrahydrocannabinol: NOT DETECTED

## 2021-03-12 LAB — RESP PANEL BY RT-PCR (FLU A&B, COVID) ARPGX2
Influenza A by PCR: NEGATIVE
Influenza B by PCR: NEGATIVE
SARS Coronavirus 2 by RT PCR: NEGATIVE

## 2021-03-12 LAB — PREGNANCY, URINE: Preg Test, Ur: NEGATIVE

## 2021-03-12 MED ORDER — OLANZAPINE 5 MG PO TBDP
15.0000 mg | ORAL_TABLET | Freq: Every day | ORAL | Status: DC
  Filled 2021-03-12 (×4): qty 1

## 2021-03-12 MED ORDER — MAGNESIUM HYDROXIDE 400 MG/5ML PO SUSP: 30.0000 mL | Freq: Every day | ORAL | Status: DC | PRN

## 2021-03-12 MED ORDER — ACETAMINOPHEN 325 MG PO TABS
650.0000 mg | ORAL_TABLET | Freq: Four times a day (QID) | ORAL | Status: DC | PRN
  Administered 2021-03-19: 650 mg via ORAL
  Filled 2021-03-12: qty 2

## 2021-03-12 MED ORDER — ZIPRASIDONE MESYLATE 20 MG IM SOLR: 20.0000 mg | INTRAMUSCULAR | Status: DC | PRN

## 2021-03-12 MED ORDER — HYDROXYZINE HCL 25 MG PO TABS
25.0000 mg | ORAL_TABLET | Freq: Three times a day (TID) | ORAL | Status: DC | PRN
  Filled 2021-03-12: qty 1

## 2021-03-12 MED ORDER — ALBUTEROL SULFATE HFA 108 (90 BASE) MCG/ACT IN AERS
2.0000 | INHALATION_SPRAY | Freq: Four times a day (QID) | RESPIRATORY_TRACT | Status: DC | PRN
  Administered 2021-03-22: 2 via RESPIRATORY_TRACT
  Filled 2021-03-12: qty 6.7

## 2021-03-12 MED ORDER — ALUM & MAG HYDROXIDE-SIMETH 200-200-20 MG/5ML PO SUSP: 30.0000 mL | ORAL | Status: DC | PRN

## 2021-03-12 MED ORDER — LITHIUM CARBONATE ER 300 MG PO TBCR
300.0000 mg | EXTENDED_RELEASE_TABLET | Freq: Two times a day (BID) | ORAL | Status: DC
  Administered 2021-03-13 – 2021-03-14 (×2): 300 mg via ORAL
  Filled 2021-03-12 (×9): qty 1

## 2021-03-12 MED ORDER — TRAZODONE HCL 50 MG PO TABS
50.0000 mg | ORAL_TABLET | Freq: Every evening | ORAL | Status: DC | PRN
  Filled 2021-03-12: qty 1

## 2021-03-12 MED ORDER — RISPERIDONE 2 MG PO TBDP: 2.0000 mg | ORAL_TABLET | Freq: Three times a day (TID) | ORAL | Status: DC | PRN

## 2021-03-12 MED ORDER — LORAZEPAM 1 MG PO TABS
1.0000 mg | ORAL_TABLET | ORAL | Status: DC | PRN
Start: 2021-03-12 — End: 2021-03-14

## 2021-03-12 NOTE — Progress Notes (Signed)
Adult Psychoeducational Group Note  Date:  03/12/2021 Time:  8:55 PM  Group Topic/Focus:  Wrap-Up Group:   The focus of this group is to help patients review their daily goal of treatment and discuss progress on daily workbooks.  Participation Level:  Active  Participation Quality:  Appropriate  Affect:  Appropriate  Cognitive:  Appropriate  Insight: Appropriate  Engagement in Group:  Developing/Improving  Modes of Intervention:  Discussion  Additional Comments:  Pt stated her goal for today was to focus on her treatment plan. Pt stated she accomplished her goal today. Pt stated she talked with her doctor and her social worker about her care today. Pt rated her overall day a 10. Pt stated she made no calls today. Pt stated she felt better about herself tonight. Pt stated she was able to attend all meals. Pt stated she took all medications provided today. Pt stated her appetite was pretty good today. Pt rated her sleep last night was pretty good. Pt stated the goal tonight was to get some rest. Pt stated she had no physical pain today. Pt deny visual hallucinations and auditory issues tonight.  Pt denies thoughts of harming herself or others. Pt stated she would alert staff if anything changed.  Felipa Furnace 03/12/2021, 8:55 PM

## 2021-03-12 NOTE — Tx Team (Signed)
Initial Treatment Plan 03/12/2021 3:40 PM Gina Hooper NOI:[id number]    PATIENT STRESSORS: Health problems Medication change or noncompliance   PATIENT STRENGTHS: Average or above average intelligence Motivation for treatment/growth Physical Health Supportive family/friends   PATIENT IDENTIFIED PROBLEMS: disorientation  anxiety  confusion  Medication noncompliance               DISCHARGE CRITERIA:  Ability to meet basic life and health needs Improved stabilization in mood, thinking, and/or behavior Motivation to continue treatment in a less acute level of care Need for constant or close observation no longer present  PRELIMINARY DISCHARGE PLAN: Attend aftercare/continuing care group Outpatient therapy Return to previous living arrangement  PATIENT/FAMILY INVOLVEMENT: This treatment plan has been presented to and reviewed with the patient, Gina Hooper.  The patient and family have been given the opportunity to ask questions and make suggestions.  Raylene Miyamoto, RN 03/12/2021, 3:40 PM

## 2021-03-12 NOTE — Progress Notes (Signed)
Pt visible on the unit some, pt delusional "I'm the doctor, I 'm leaving tonight" pt refused her night medication.     03/12/21 2100  Psych Admission Type (Psych Patients Only)  Admission Status Voluntary  Psychosocial Assessment  Patient Complaints Anxiety;Suspiciousness  Eye Contact Fair;Watchful  Facial Expression Anxious;Pensive;Worried  Affect Anxious;Apprehensive;Preoccupied  Speech Logical/coherent  Interaction Cautious;Forwards little;Guarded;Minimal  Motor Activity Slow  Appearance/Hygiene Unremarkable  Behavior Characteristics Resistant to care;Guarded  Mood Suspicious;Labile;Preoccupied  Thought Administrator, sports thinking  Content Paranoia  Delusions Paranoid  Perception Derealization  Hallucination None reported or observed  Judgment Poor  Confusion Moderate  Danger to Self  Current suicidal ideation? Denies  Danger to Others  Danger to Others None reported or observed

## 2021-03-12 NOTE — Progress Notes (Signed)
Pt provided specimen cup with instructions. Waiting on sample.

## 2021-03-12 NOTE — Progress Notes (Signed)
Patient ID: Gina Hooper, female   DOB: 1990/12/26, 30 y.o.   MRN: [id number] Admission Note  Pt is a 30 yo female that presents voluntarily on 03/12/2021 after leaving last week and stopping their medications. From report, pt stopped taking their medications and started to decompensate. Pt was confused, paranoid, and watchful during assessment. Pt had to have a sitter while waiting for admission. Pt needed considerable encouragement to come to the unit. Pt signed their voluntary admission form "Gina Hooper". Pt denies current si/hi/ah/vh and verbally agrees to approach staff if these become apparent and/or before harming self/others while at bhh. Consents signed, handbook detailing the patient's rights, responsibilities, and visitor guidelines provided. Skin/belongings search completed and patient oriented to unit. Patient stable at this time. Patient given the opportunity to express concerns and ask questions. Patient given toiletries. Will continue to monitor.   [facility] Assessment 03/12/2021:  Gina Hooper is a 30 y.o female, seen by this provider with TTS counselor face to face with history of bipolar 1 disorder, presents to [facility] as voluntary walk-in accompanied by her fiance, Gina Hooper. Gina Hooper is present during interview per patient's request. Fiance reports "she just left here on Friday, stopped taking her meds on Sunday, I found them in the trash can today" Reports she was back to her normal self on Friday when she discharged, but he noticed that she started having mood swings again on Saturday.    Patient refers to herself as "Gina Hooper ", reports that he is not my fianc and always lies and got a stop.  Sitting calmly in the exam room, cooperative with exam.  Denies depression, "I feel great and confidence with myself "speech normal, volume normal thought processes illogical, paranoid about medications, and tangential.  Reports that she does not  take medications and she threw the medications away.  Denies  suicidal ideation no intent no plan, denies homicidal ideation, denies visual hallucinations, when asked about auditory hallucinations she repeats "my cameras out in the world " when asked about aggressive behavior she reports "they crossed my line "difficult to determine if she is aggressive toward others she reports "using my Gina Hooper on them" denies history of trauma/abuse.   Reports that she has not slept in 2 days, when asked how many hours, she started talking about her pills.  Reports appetite is normal denies substance and alcohol use.   Her last psychiatric admission at [facility] July 7 through July 14.  Neysa Bonito reports that she was back to her normal self after she discharged on Friday, mood swings started on Saturday, noticed medications in the trash can today.

## 2021-03-12 NOTE — H&P (Addendum)
Behavioral Health Medical Screening Exam    Total Time spent with patient: 45 minutes Gina Hooper is a 30 y.o female, seen by this provider with TTS counselor face to face with history of bipolar 1 disorder, presents to [facility] as voluntary walk-in accompanied by her fiance, Gina Hooper. Gina Hooper is present during interview per patient's request. Fiance reports "she just left here on Friday, stopped taking her meds on Sunday, I found them in the trash can today" Reports she was back to her normal self on Friday when she discharged, but he noticed that she started having mood swings again on Saturday.   Patient refers to herself as "Gina Hooper ", reports that he is not my fianc and always lies and got a stop.  Sitting calmly in the exam room, cooperative with exam.  Denies depression, "I feel great and confidence with myself "speech normal, volume normal thought processes illogical, paranoid,  and tangential.  Reports that she does not  take medications and she threw the medications away.  Denies suicidal ideation no intent no plan, denies homicidal ideation, denies visual hallucinations, when asked about auditory hallucinations she repeats "my cameras out in the world " when asked about aggressive behavior she reports "they crossed my line" difficult to determine if she is aggressive toward others she reports "using my Gina Hooper on them" denies history of trauma/abuse.  Reports that she has not slept in 2 days, when asked how many hours, she started talking about her pills.  Reports appetite is normal denies substance and alcohol use.  Her last psychiatric admission at [facility] July 7 through July 14.  Neysa Bonito reports that she was back to her normal self after she discharged on Friday, mood swings started on Saturday, noticed medications in the trash can today.    Psychiatric Specialty Exam:  Presentation  General Appearance: Fairly Groomed  Eye Contact:Fair  Speech:Normal Rate  Speech  Volume:Normal  Handedness:Right   Mood and Affect  Mood:Anxious  Affect:Congruent   Thought Process  Thought Processes:Irrevelant  Descriptions of Associations:Tangential  Orientation:Partial  Thought Content:Illogical; Paranoid Ideation; Tangential  History of Schizophrenia/Schizoaffective disorder:No  Duration of Psychotic Symptoms:N/A  Hallucinations:Hallucinations: Auditory Description of Auditory Hallucinations: "my cmaras out in the world" Ideas of Reference:Paranoia (threw medications away in trash and reports that she does not need them)  Suicidal Thoughts:Suicidal Thoughts: No Homicidal Thoughts:Homicidal Thoughts: No  Sensorium  Memory:Immediate Fair; Recent Fair; Remote Fair  Judgment:Poor  Insight:Lacking   Executive Functions  Concentration:Fair  Attention Span:Fair  Recall:Fair  Fund of Knowledge:Fair  Language:Good   Psychomotor Activity  Psychomotor Activity: Psychomotor Activity: Normal  Assets  Assets:Communication Skills; Desire for Improvement; Housing; Resilience; Social Support; Leisure Time   Sleep  Sleep: Number of Hours of Sleep: 1   Physical Exam: Physical Exam Cardiovascular:     Rate and Rhythm: Normal rate and regular rhythm.  Pulmonary:     Effort: Pulmonary effort is normal.     Breath sounds: Normal breath sounds.  Skin:    General: Skin is warm and dry.  Neurological:     Mental Status: She is alert.     GCS: GCS eye subscore is 4. GCS verbal subscore is 5. GCS motor subscore is 6.     Comments: Refers to self as Anabelle. Refers to herself as her turf. Constantly denying her fiance Gina Hooper is her fiance. Not at baseline.   Psychiatric:        Attention and Perception: She perceives auditory (my cameras in the [place]old) hallucinations.  Speech: Speech is tangential.        Thought Content: Thought content is paranoid and delusional. Thought content does not include homicidal or suicidal ideation. Thought  content does not include homicidal or suicidal plan.   ROS Blood pressure 120/85, pulse 79, temperature 98.7 F (37.1 C), temperature source Oral, resp. rate 18, height 5\' 5"  (1.651 m), weight 67 kg, SpO2 100 %. Body mass index is 24.58 kg/m.  Musculoskeletal: Strength & Muscle Tone: within normal limits Gait & Station: normal Patient leans: N/A   Recommendations:  Based on my evaluation the patient does not appear to have an emergency medical condition. Recommend inpatient hospital stay to stabilize on medications and to get connected with ACT Team per triage specialist recommendation for more thorough community support.  Meets criteria for inpatient admission. Agrees to plan. Covid test ordered. Fiance, Sam left the building "I can't take this anymore" Sandwich and water provided while awaiting Covid results.    1535:    Initially agreed to inpatient admission, while awaiting Covid results, she now says she needs to leave. , [facility] will initiate IVC process per protocol.   [facility], NP 03/12/2021, 3:54 PM

## 2021-03-12 NOTE — BH Assessment (Addendum)
Comprehensive Clinical Assessment (CCA) Note  03/12/2021 Gina Hooper [id number]  Disposition: TTS Assessment completed. The [facility] provider Gina Bodo, NP) was present for today's assessment and recommended inpatient psychiatric treatment. [facility] notified of patient's disposition. [facility] has an appropriate bed available for this patient. The [facility] will coordinate a bed assignment for patient on the adult unit. However, she will need to be IVC by psychiatrist. The IVC process will be initiated. [place]-SUICIDE SEVERITY  RATING SCALE (C-SSRS) completed and patient scored "No Risk". Therefore, no 1-1 sitter precautions recommended at this time.   The patient demonstrates the following risk factors for suicide: Chronic risk factors for suicide include: psychiatric disorder of Bipolar I disorder, current or most recent episode manic, with psychotic features (HCC) and Rule out Substance Use Disorder . Acute risk factors for suicide include:  acute psychosis . Protective factors for this patient include: positive social support. Considering these factors, the overall suicide risk at this point appears to be "No Risk". Patient is not appropriate for outpatient follow up.   COLUMBIA-SUICIDE SEVERITY RATING SCALE (C-SSRS) completed and patient scored "No Risk". Therefore, no 1-1 sitter precautions recommended at this time.   Flowsheet Row Admission (Current) [facility] Ad[facility] (Discharged) [facility] from 02/27/2021 in [facility]  C-SSRS RISK CATEGORY No Risk No Risk Error: Q3, 4, or 5 should not be populated when Q2 is No      Chief Complaint:  Chief Complaint  Patient presents with   bipolar disorder w psychotic features   Visit Diagnosis: Bipolar I disorder, current or most recent episode manic, with psychotic features (HCC) Active Problems and   Polysubstance abuse (HCC)  Patient presents with a female that identifies himself as patient's fianc. Patient states that he is not her fianc. Appears paranoid and suspicious toward the female that she presents with today. Patient displays tangential thought processes, flight of ideas, paranoid delusions, and identifies herself as different persons, "Gina Hooper", "Gina Hooper", "Gina Hooper". States that people are lying to her, she is retired, returning to her turf, etc. Her responses are non-sensible and she was difficult to follow. Patient unable to answer assessment questions appropriately. The accompanying female states that patient was discharged from [facility] (Inpatient[facility] services this past Friday, 03/08/21. She discharged home that day with medications. She has been non-compliant with medications since discharge. Reportedly, her medications were found in the trash. Patient states that she doesn't need her medications. She has no psychiatrist and/or therapist and states she doesn't need either service. Denies SI and HI. When asked about auditory hallucinations she says, "The camera's". Denies visual hallucinations. She has #1 child and lives with fianc. Diagnosed with Bipolar I disorder, current or most recent episode manic, with psychotic features and Polysubstance Use. However, patient denies history of substance use during today's assessment. Upon chart review she has a history of snorting OTC pain medications. Marland Kitchen   CCA Screening, Triage and Referral (STR)  Patient Reported Information How did you hear about [place]? Family/Friend  What Is the Reason for Your Visit/Call Today? Patient presents with a female that identifies himself as patient's fianc. Patient states that he is not her fianc. Appears paranoid and suspicious toward the female that she presents with today. Patient displays tangential thought processes, flight of ideas, paranoid delusions, and identifies herself as different persons, "Gina Hooper", "Gina  Hooper", "Gina Hooper". States that people are  lying to her, she is retired, returning to her turf, etc. Her responses are non-sensible and she was difficult to follow. Patient unable to answer assessment questions appropriately. The accompanying female states that patient was discharged from [facility] (Inpatient) services this past Friday, 03/08/21. She discharged home that day with medications. She has been non-compliant with medications since discharge. Reportedly, her medications were found in the trash. Patient states that she doesn't need her medications. She has no psychiatrist and/or therapist and states she doesn't need either service. Denies SI and HI. When asked about auditory hallucinations she says, "The camera's". Denies visual hallucinations. She has #1 child and lives with fianc. Diagnosed with Bipolar I disorder, current or most recent episode manic, with psychotic features and Polysubstance Use. However, patient denies history of substance use during today's assessment. Upon chart review she has a history of snorting OTC pain medications. .  How Long Has This Been Causing You Problems? <Week  What Do You Feel Would Help You the Most Today? Treatment for Depression or other mood problem   Have You Recently Had Any Thoughts About Hurting Yourself? No  Are You Planning to Commit Suicide/Harm Yourself At This time? No   Have you Recently Had Thoughts About Hurting Someone [name]? No  Are You Planning to Harm Someone at This Time? No  Explanation: No data recorded  Have You Used Any Alcohol or Drugs in the Past 24 Hours? Yes  How Long Ago Did You Use Drugs or Alcohol? No data recorded What Did You Use and How Much? Snorting OT pain medications   Do You Currently Have a Therapist/Psychiatrist? No  Name of Therapist/Psychiatrist: No data recorded  Have You Been Recently Discharged From Any Office Practice or Programs? No  Explanation of Discharge From Practice/Program: No data  recorded    CCA Screening Triage Referral Assessment Type of Contact: Tele-Assessment  Telemedicine Service Delivery:   Is this Initial or Reassessment? Initial Assessment  Date Telepsych consult ordered in [facility]:  03/12/21  Time Telepsych consult ordered in [facility]:  No data recorded Location of Assessment: [facility]  Provider Location: [facility]   Collateral Involvement: No data recorded  Does Patient Have a Court Appointed Legal Guardian? No data recorded Name and Contact of Legal Guardian: No data recorded If Minor and Not Living with Parent(s), Who has Custody? No data recorded Is CPS involved or ever been involved? Never  Is APS involved or ever been involved? Never   Patient Determined To Be At Risk for Harm To Self or Others Based on Review of Patient Reported Information or Presenting Complaint? No  Method: No data recorded Availability of Means: No data recorded Intent: No data recorded Notification Required: No data recorded Additional Information for Danger to Others Potential: No data recorded Additional Comments for Danger to Others Potential: No data recorded Are There Guns or Other Weapons in Your Home? No data recorded Types of Guns/Weapons: No data recorded Are These Weapons Safely Secured?                            No data recorded Who Could Verify You Are Able To Have These Secured: No data recorded Do You Have any Outstanding Charges, Pending Court Dates, Parole/Probation? No data recorded Contacted To Inform of Risk of Harm To Self or Others: No data recorded   Does Patient Present under Involuntary Commitment? No  IVC Papers Initial File Date: No data recorded  [place] of Residence: [place]   Patient Currently Receiving the Following Services: No data recorded  Determination of Need: Urgent (48 hours)   Options For Referral: Inpatient Hospitalization; Medication Management     CCA Biopsychosocial Patient Reported  Schizophrenia/Schizoaffective Diagnosis in Past: No   Strengths: No data recorded  Mental Health Symptoms Depression:   Sleep (too much or little)   Duration of Depressive symptoms:  Duration of Depressive Symptoms: Greater than two weeks   Mania:   Change in energy/activity   Anxiety:    None   Psychosis:   Hallucinations   Duration of Psychotic symptoms:  Duration of Psychotic Symptoms: N/A   Trauma:   None   Obsessions:   None   Compulsions:   None   Inattention:   None   Hyperactivity/Impulsivity:   None   Oppositional/Defiant Behaviors:   None   Emotional Irregularity:   None   Other Mood/Personality Symptoms:  No data recorded   Mental Status Exam Appearance and self-care  Stature:   Average   Weight:   Average weight   Clothing:   Age-appropriate   Grooming:   Normal   Cosmetic use:   None   Posture/gait:   Normal   Motor activity:   Agitated   Sensorium  Attention:   Normal   Concentration:   Normal   Orientation:   Person; Place   Recall/memory:   Normal   Affect and Mood  Affect:   Constricted   Mood:   Euphoric   Relating  Eye contact:   Normal   Facial expression:   Responsive   Attitude toward examiner:   Cooperative   Thought and Language  Speech flow:  Clear and Coherent   Thought content:   Appropriate to Mood and Circumstances   Preoccupation:   None   Hallucinations:   Auditory   Organization:  No data recorded  [employer] of Knowledge:   Fair   Intelligence:   Average   Abstraction:   Normal   Judgement:   Fair   Dance movement psychotherapist:   Adequate   Insight:   Fair   Decision Making:   Confused   Social Functioning  Social Maturity:   Responsible   Social Judgement:   Normal   Stress  Stressors:   Illness   Coping Ability:   Normal   Skill Deficits:   None   Supports:   Family     Religion:    Leisure/Recreation:     Exercise/Diet: Exercise/Diet Have You Gained or Lost A Significant Amount of Weight in the Past Six Months?: No Do You Follow a Special Diet?: No Do You Have Any Trouble Sleeping?: Yes Explanation of Sleeping Difficulties: poor sleep past 2 days   CCA Employment/Education Employment/Work Situation: Employment / Work Situation Employment Situation: Unemployed Patient's Job has Been Impacted by Current Illness: No Has Patient ever Been in Equities trader?: No  Education: Education Is Patient Currently Attending School?: No Did Theme park manager?: No Did You Have An Individualized Education Program (IIEP): No Did You Have Any Difficulty At [employer]?: No Patient's Education Has Been Impacted by Current Illness: No   CCA Family/Childhood History Family and Relationship History:    Childhood History:     Child/Adolescent Assessment:     CCA Substance Use Alcohol/Drug Use: Alcohol / Drug Use Pain Medications: See MAR Prescriptions: See MAR Over the Counter: See MAR History of alcohol / drug use?: Yes Substance #1 Name of Substance 1:  Patient denies history of substance use. Upon chart review she reported on her last assessment x1 week ago that she snorts OTC pain medications. 1 - Age of First Use: unknown 1 - Amount (size/oz): unknown 1 - Frequency: unknown 1 - Duration: unknown 1 - Last Use / Amount: unknown 1 - Method of Aquiring: unknown 1- Route of Use: unknown.                       ASAM's:  Six Dimensions of Multidimensional Assessment  Dimension 1:  Acute Intoxication and/or Withdrawal Potential:      Dimension 2:  Biomedical Conditions and Complications:      Dimension 3:  Emotional, Behavioral, or Cognitive Conditions and Complications:     Dimension 4:  Readiness to Change:     Dimension 5:  Relapse, Continued use, or Continued Problem Potential:     Dimension 6:  Recovery/Living Environment:     ASAM Severity Score:    ASAM Recommended  Level of Treatment:     Substance use Disorder (SUD)    Recommendations for Services/Supports/Treatments: Recommendations for Services/Supports/Treatments Recommendations For Services/Supports/Treatments: ACCTT (Assertive Community Treatment), Intensive In-Home Services, Medication Management, Individual Therapy, Inpatient Hospitalization  Discharge Disposition:    DSM5 Diagnoses: Patient Active Problem List   Diagnosis Date Noted   Bipolar I disorder, current or most recent episode manic, with psychotic features (HCC) 02/28/2021   Affective psychosis, bipolar (HCC)    History of anemia 10/05/2020   SOB (shortness of breath) 10/05/2020   Polysubstance abuse (HCC)    Acute respiratory failure with hypoxia (HCC) 04/07/2020   SIRS (systemic inflammatory response syndrome) (HCC) 04/05/2020   Hepatitis 04/04/2020   Acute liver failure 04/04/2020   Alcohol use disorder, mild, in sustained remission 04/04/2020   Thrombocytopenia (HCC) 04/04/2020   Coagulopathy (HCC) 04/04/2020   Ulcer of ankle, left, limited to breakdown of skin (HCC)    Osteomyelitis (HCC) 01/14/2018   Alcohol dependence with uncomplicated withdrawal (HCC) 01/14/2018     Referrals to Alternative Service(s): Referred to Alternative Service(s):   Place:   Date:   Time:    Referred to Alternative Service(s):   Place:   Date:   Time:    Referred to Alternative Service(s):   Place:   Date:   Time:    Referred to Alternative Service(s):   Place:   Date:   Time:     Melynda Ripple, Counselor

## 2021-03-13 DIAGNOSIS — F19959 Other psychoactive substance use, unspecified with psychoactive substance-induced psychotic disorder, unspecified: Secondary | ICD-10-CM | POA: Diagnosis present

## 2021-03-13 DIAGNOSIS — F312 Bipolar disorder, current episode manic severe with psychotic features: Secondary | ICD-10-CM | POA: Diagnosis not present

## 2021-03-13 DIAGNOSIS — F1491 Cocaine use, unspecified, in remission: Secondary | ICD-10-CM | POA: Diagnosis present

## 2021-03-13 DIAGNOSIS — F1420 Cocaine dependence, uncomplicated: Secondary | ICD-10-CM | POA: Diagnosis present

## 2021-03-13 HISTORY — DX: Other psychoactive substance use, unspecified with psychoactive substance-induced psychotic disorder, unspecified: F19.959

## 2021-03-13 MED ORDER — LORAZEPAM 2 MG/ML IJ SOLN
INTRAMUSCULAR | Status: AC
  Administered 2021-03-13: 2 mg
  Filled 2021-03-13: qty 1

## 2021-03-13 MED ORDER — OLANZAPINE 10 MG PO TBDP
20.0000 mg | ORAL_TABLET | Freq: Every day | ORAL | Status: DC
  Administered 2021-03-13: 20 mg via ORAL
  Filled 2021-03-13 (×3): qty 2

## 2021-03-13 MED ORDER — DIPHENHYDRAMINE HCL 50 MG/ML IJ SOLN: 50.0000 mg | Freq: Once | INTRAMUSCULAR | Status: AC

## 2021-03-13 MED ORDER — OLANZAPINE 5 MG PO TBDP
5.0000 mg | ORAL_TABLET | Freq: Every day | ORAL | Status: DC
  Administered 2021-03-14: 5 mg via ORAL
  Filled 2021-03-13 (×4): qty 1

## 2021-03-13 MED ORDER — LORAZEPAM 2 MG/ML IJ SOLN: 2.0000 mg | Freq: Once | INTRAMUSCULAR | Status: AC

## 2021-03-13 MED ORDER — DIPHENHYDRAMINE HCL 50 MG/ML IJ SOLN
INTRAMUSCULAR | Status: AC
  Administered 2021-03-13: 50 mg
  Filled 2021-03-13: qty 1

## 2021-03-13 MED ORDER — HALOPERIDOL LACTATE 5 MG/ML IJ SOLN
INTRAMUSCULAR | Status: AC
  Administered 2021-03-13: 5 mg
  Filled 2021-03-13: qty 1

## 2021-03-13 MED ORDER — HALOPERIDOL LACTATE 5 MG/ML IJ SOLN: 5.0000 mg | Freq: Once | INTRAMUSCULAR | Status: AC

## 2021-03-13 NOTE — Progress Notes (Signed)
Progress note  Pt found in bed. Pt refused morning medications. Pt became agitated/angry after a phone call. Pt was throwing items and slamming their door. Pt was provided IM medication and took these willingly. Pt has been resting since. Pt still seemed disorganized and hyper religious this morning. Pt denies si/hi/ah/vh and verbally agrees to approach staff if these become apparent or before harming themselves/others while at [facility].  A: Pt provided support and encouragement. Pt given medication per protocol and standing orders. Q49m safety checks implemented and continued.  R: Pt safe on the unit. Will continue to monitor.

## 2021-03-13 NOTE — [facility] Counselor (Signed)
CSW attempted to complete this pt's PSA, however this pt was sleeping and could not be woken.    Ruthann Cancer MSW, LCSW Clincal Social Worker  [facility]

## 2021-03-13 NOTE — Tx Team (Signed)
Interdisciplinary Treatment and Diagnostic Plan Update  03/13/2021 Time of Session: 9:25am  Gina Hooper MRN: [id number]  Principal Diagnosis: Bipolar I disorder, current or most recent episode manic, with psychotic features ([place])  Secondary Diagnoses: Principal Problem:   Bipolar I disorder, current or most recent episode manic, with psychotic features ([place]) Active Problems:   Bipolar disorder with psychotic features ([place])   Current Medications:  Current Facility-Administered Medications  Medication Dose Route Frequency Provider Last Rate Last Admin   acetaminophen (TYLENOL) tablet 650 mg  650 mg Oral Q6H PRN Chalmers Guest, NP       albuterol (VENTOLIN HFA) 108 (90 Base) MCG/ACT inhaler 2 puff  2 puff Inhalation Q6H PRN Chalmers Guest, NP       alum & mag hydroxide-simeth (MAALOX/MYLANTA) 200-200-20 MG/5ML suspension 30 mL  30 mL Oral Q4H PRN Chalmers Guest, NP       hydrOXYzine (ATARAX/VISTARIL) tablet 25 mg  25 mg Oral TID PRN Chalmers Guest, NP       lithium carbonate (LITHOBID) CR tablet 300 mg  300 mg Oral Q12H Chalmers Guest, NP       risperiDONE (RISPERDAL M-TABS) disintegrating tablet 2 mg  2 mg Oral Q8H PRN Chalmers Guest, NP       And   LORazepam (ATIVAN) tablet 1 mg  1 mg Oral PRN Chalmers Guest, NP       And   ziprasidone (GEODON) injection 20 mg  20 mg Intramuscular PRN Chalmers Guest, NP       magnesium hydroxide (MILK OF MAGNESIA) suspension 30 mL  30 mL Oral Daily PRN Chalmers Guest, NP       OLANZapine zydis (ZYPREXA) disintegrating tablet 15 mg  15 mg Oral QHS Chalmers Guest, NP       traZODone (DESYREL) tablet 50 mg  50 mg Oral QHS PRN Chalmers Guest, NP       PTA Medications: Medications Prior to Admission  Medication Sig Dispense Refill Last Dose   albuterol (VENTOLIN HFA) 108 (90 Base) MCG/ACT inhaler Inhale 2 puffs into the lungs every 6 (six) hours as needed for wheezing or shortness of breath. (Patient not taking: Reported on 03/13/2021) 1 each 0 Not  Taking   hydrOXYzine (ATARAX/VISTARIL) 25 MG tablet Take 1 tablet (25 mg total) by mouth every 6 (six) hours as needed for anxiety. (Patient not taking: Reported on 03/13/2021) 75 tablet 0 Not Taking   lithium carbonate (LITHOBID) 300 MG CR tablet Take 1 tablet (300 mg total) by mouth every 12 (twelve) hours. For mood stabilization (Patient not taking: Reported on 03/13/2021) 60 tablet 0 Not Taking   OLANZapine (ZYPREXA) 15 MG tablet Take 1 tablet (15 mg total) by mouth at bedtime. For mood control (Patient not taking: Reported on 03/13/2021) 30 tablet 0 Not Taking   traZODone (DESYREL) 50 MG tablet Take 1 tablet (50 mg total) by mouth at bedtime as needed for sleep. (Patient not taking: Reported on 03/13/2021) 30 tablet 0 Not Taking    Patient Stressors: Health problems Medication change or noncompliance  Patient Strengths: Average or above average intelligence Motivation for treatment/growth Physical Health Supportive family/friends  Treatment Modalities: Medication Management, Group therapy, Case management,  1 to 1 session with clinician, Psychoeducation, Recreational therapy.   Physician Treatment Plan for Primary Diagnosis: Bipolar I disorder, current or most recent episode manic, with psychotic features ([place]) Long Term Goal(s):     Short Term Goals:  Medication Management: Evaluate patient's response, side effects, and tolerance of medication regimen.  Therapeutic Interventions: 1 to 1 sessions, Unit Group sessions and Medication administration.  Evaluation of Outcomes: Not Met  Physician Treatment Plan for Secondary Diagnosis: Principal Problem:   Bipolar I disorder, current or most recent episode manic, with psychotic features ([place]) Active Problems:   Bipolar disorder with psychotic features ([place])  Long Term Goal(s):     Short Term Goals:       Medication Management: Evaluate patient's response, side effects, and tolerance of medication regimen.  Therapeutic  Interventions: 1 to 1 sessions, Unit Group sessions and Medication administration.  Evaluation of Outcomes: Not Met   RN Treatment Plan for Primary Diagnosis: Bipolar I disorder, current or most recent episode manic, with psychotic features ([place]) Long Term Goal(s): Knowledge of disease and therapeutic regimen to maintain health will improve  Short Term Goals: Ability to remain free from injury will improve, Ability to demonstrate self-control, Ability to participate in decision making will improve, Ability to verbalize feelings will improve, Ability to disclose and discuss suicidal ideas, and Ability to identify and develop effective coping behaviors will improve  Medication Management: RN will administer medications as ordered by provider, will assess and evaluate patient's response and provide education to patient for prescribed medication. RN will report any adverse and/or side effects to prescribing provider.  Therapeutic Interventions: 1 on 1 counseling sessions, Psychoeducation, Medication administration, Evaluate responses to treatment, Monitor vital signs and CBGs as ordered, Perform/monitor CIWA, COWS, AIMS and Fall Risk screenings as ordered, Perform wound care treatments as ordered.  Evaluation of Outcomes: Not Met   LCSW Treatment Plan for Primary Diagnosis: Bipolar I disorder, current or most recent episode manic, with psychotic features ([place]) Long Term Goal(s): Safe transition to appropriate next level of care at discharge, Engage patient in therapeutic group addressing interpersonal concerns.  Short Term Goals: Engage patient in aftercare planning with referrals and resources, Increase social support, Increase ability to appropriately verbalize feelings, Increase emotional regulation, Facilitate acceptance of mental health diagnosis and concerns, Facilitate patient progression through stages of change regarding substance use diagnoses and concerns, Identify triggers associated with  mental health/substance abuse issues, and Increase skills for wellness and recovery  Therapeutic Interventions: Assess for all discharge needs, 1 to 1 time with Social worker, Explore available resources and support systems, Assess for adequacy in community support network, Educate family and significant other(s) on suicide prevention, Complete Psychosocial Assessment, Interpersonal group therapy.  Evaluation of Outcomes: Not Met   Progress in Treatment: Attending groups: No. Participating in groups: No. Taking medication as prescribed: No. Toleration medication: Yes. Family/Significant other contact made: Yes, individual(s) contacted:  If consents are provided  Patient understands diagnosis: No. Discussing patient identified problems/goals with staff: Yes. Medical problems stabilized or resolved: Yes. Denies suicidal/homicidal ideation: Yes. Issues/concerns per patient self-inventory: No.   New problem(s) identified: No, Describe:  None   New Short Term/Long Term Goal(s): medication stabilization, elimination of SI thoughts, development of comprehensive mental wellness plan.   Patient Goals: Did not attend   Discharge Plan or Barriers: Patient recently admitted. CSW will continue to follow and assess for appropriate referrals and possible discharge planning.   Reason for Continuation of Hospitalization: Aggression Anxiety Delusions  Medication stabilization  Estimated Length of Stay: 3 to 5 days   Attendees: Patient: Did not attend  03/13/2021   Physician: Lestine Mount, DO 03/13/2021   Nursing:  03/13/2021   RN Care Manager: 03/13/2021   Social Worker: Glenard Haring  Lauralee Evener 03/13/2021   Recreational Therapist:  03/13/2021   Other:  03/13/2021   Other:  03/13/2021   Other: 03/13/2021     Scribe for Treatment Team: Darleen Crocker, [place] 03/13/2021 2:33 PM

## 2021-03-13 NOTE — Progress Notes (Signed)
Pt did not attend orientation/goals group. 
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
## 2021-03-13 NOTE — Progress Notes (Signed)
Recreation Therapy Notes  Date: 7.20.22 Time: 1000 Location: 500 Hall Dayroom  Group Topic: Coping Skills  Goal Area(s) Addresses:  Patient is to identify healthy and unhealthy coping skills. Patient is to identify the benefits and consequences of each coping strategy. Patient is to identify how healthy coping strategies can be used post d/c.  Behavioral Response: None  Intervention: Worksheet  Activity: Healthy vs Unhealthy Coping Strategies.  Patient is to identify a problem they are currently dealing with.  Patient is to then identify unhealthy coping strategies used to deal with problem and consequences.  Pt then identifies healthy coping strategies to deal with problem.  Pt also identifies the expected outcome and barriers to using healthy coping strategies.  Education: Pharmacologist, Building control surveyor.   Education Outcome: Acknowledges understanding/In group clarification offered/Needs additional education.   Clinical Observations/Feedback: Due to outburst on unit by another pt.  Pt was unable to take place in dayroom.  LRT left worksheets in patients rooms.  LRT did complete worksheets with patients who were able to complete them.    Caroll Rancher, LRT/CTRS         Caroll Rancher A 03/13/2021 12:30 PM

## 2021-03-13 NOTE — Progress Notes (Signed)
Pt initially refused her HS medication, pt educated on the forced medication order and protocol and consequences of refusing medication. Pt agreed to take the PO medication.

## 2021-03-13 NOTE — Progress Notes (Signed)
Pt did not attend psychoeducational group. 

## 2021-03-13 NOTE — BHH Suicide Risk Assessment (Signed)
[facility] Admission Suicide Risk Assessment   Nursing information obtained from:  Patient, Review of record Demographic factors:  Low socioeconomic status Current Mental Status:  NA Loss Factors:  Decline in physical health Historical Factors:  Impulsivity Risk Reduction Factors:  Sense of responsibility to family, Positive social support, Living with another person, especially a relative, Positive therapeutic relationship  Total Time spent with patient: 20 minutes Principal Problem: Bipolar I disorder, current or most recent episode manic, with psychotic features ([place]) Diagnosis:  Principal Problem:   Bipolar I disorder, current or most recent episode manic, with psychotic features ([place]) Active Problems:   Polysubstance abuse ([place])   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  Patient's case discussed in detail with members of the treatment team.  I met with and evaluated the patient today on the unit. Gina Hooper is a 30 year old female with a history of bipolar disorder with psychotic features, polysubstance abuse, cocaine use, alcohol dependence in early remission who was recently discharged from inpatient psychiatric hospitalization on 03/07/2021.  Patient presented as a walk-in accompanied by her fianc to [facility] on 03/12/2021 for worsening mood swings.  Per TTS notes, patient's fianc stated that patient was back to her normal self when she discharged on July 14 but patient stopped taking her medication on Sunday and started to have mood swings.  Fianc reported finding patient's medications in the trash.  On initial evaluation in TTS the patient presented as illogical, paranoid, delusional and tangential, was experiencing auditory hallucinations and referred to herself as "Gina Hooper".  She made vague statements about "cameras out in the world," people lying to her, and reported she had not slept in 2 days.  She  denied alcohol or drug use after discharge.  Chart notes document that patient denied SI, HI on initial assessment.  Involuntary commitment petition was completed at [facility].  After admission to the 500 unit, patient was grandiose last night, stated she was a doctor and refused her nighttime medication (olanzapine 15 mg QHS and Lithobid 300 mg).  Urine drug screen collected on the evening of admission was positive for cocaine.  Urine pregnancy test was negative.  Patient slept 8.5 hours last night.  She refused morning medications and refused to have blood drawn for lab work this morning.  Later in the morning the patient became agitated and angry after a phone call, throwing items and slamming the door.  She received PRN IM medication of Haldol 5 mg IM, diphenhydramine 50 mg IM and lorazepam 2 mg IM at approximately 9 AM today.  She continues to be disorganized and hyperreligious but has been resting in her room for most of the day today.  I attempted to meet with her this afternoon but patient was still tired from the medications received this morning and was asleep in her bed..  When I attempted to speak with her, the patient opened her eyes and only briefly engaged with me.  She was unable to state why she was admitted to the hospital.  Patient denied SI, AI or HI.  She denied pain or physical problems.  She stated she was tired and then closed her eyes and return to sleep thereby concluding the interaction with me.  Patient was recently admitted to [facility] from 02/28/2021 until 03/07/2021 for worsening insomnia, confusion, responding to internal stimuli and disorganized and bizarre behavior in the context of medication nonadherence and substance use.  She was treated with a  combination of olanzapine 15 mg at bedtime and lithium carbonate 300 mg twice a day with good resolution of symptoms.  She has a past psychiatric history of bipolar disorder and outpatient treatment at [facility].  Continued Clinical Symptoms:   Alcohol Use Disorder Identification Test Final Score (AUDIT): 0 The "Alcohol Use Disorders Identification Test", Guidelines for Use in Primary Care, Second Edition.  World Pharmacologist [facility]). Score between 0-7:  no or low risk or alcohol related problems. Score between 8-15:  moderate risk of alcohol related problems. Score between 16-19:  high risk of alcohol related problems. Score 20 or above:  warrants further diagnostic evaluation for alcohol dependence and treatment.   CLINICAL FACTORS:   Severe Anxiety and/or Agitation Bipolar Disorder  Alcohol/Substance Abuse/Dependencies Currently Psychotic Previous Psychiatric Diagnoses and Treatments   Musculoskeletal: Strength & Muscle Tone: within normal limits Gait & Station: normal Patient leans: N/A  Psychiatric Specialty Exam:  Presentation  General Appearance: Fairly Groomed  Eye Contact:Fair  Speech:Normal Rate  Speech Volume:Normal  Handedness:Right   Mood and Affect  Mood:Anxious  Affect:Congruent   Thought Process  Thought Processes:Irrevelant  Descriptions of Associations:Tangential  Orientation:Partial  Thought Content:Illogical; Paranoid Ideation; Tangential  History of Schizophrenia/Schizoaffective disorder:No  Duration of Psychotic Symptoms:N/A  Hallucinations:Hallucinations: Auditory Description of Auditory Hallucinations: "my cmaras out in the world"  Ideas of Reference:Paranoia (threw medications away in trash and reports that she does not need them)  Suicidal Thoughts:Suicidal Thoughts: No  Homicidal Thoughts:Homicidal Thoughts: No   Sensorium  Memory:Immediate Fair; Recent Fair; Remote Fair  Judgment:Poor  Insight:Lacking   Executive Functions  Concentration:Fair  Attention Span:Fair  [place]  Language:Good   Psychomotor Activity  Psychomotor Activity:Psychomotor Activity: Normal   Assets  Assets:Communication Skills; Desire  for Improvement; Housing; Resilience; Social Support; Leisure Time   Sleep  Sleep:Number of Hours of Sleep: 1    Physical Exam: Physical Exam Vitals and nursing note reviewed.  Constitutional:      General: She is not in acute distress.    Appearance: Normal appearance. She is not diaphoretic.  HENT:     Head: Normocephalic and atraumatic.  Pulmonary:     Effort: Pulmonary effort is normal.  Neurological:     General: No focal deficit present.     Mental Status: She is oriented to person, place, and time.   Review of Systems  Unable to perform ROS: Psychiatric disorder (Patient is currently sleepy from receiving medications for agitation and is unable to participate and review of systems questioning)  Blood pressure 120/85, pulse 79, temperature 98.7 F (37.1 C), temperature source Oral, resp. rate 18, height $RemoveBe'5\' 5"'xGHipgQAU$  (1.651 m), weight 67 kg, SpO2 100 %. Body mass index is 24.58 kg/m.   COGNITIVE FEATURES THAT CONTRIBUTE TO RISK:  Loss of executive function    SUICIDE RISK:   Severe:  Frequent, intense, and enduring suicidal ideation, specific plan, no subjective intent, but some objective markers of intent (i.e., choice of lethal method), the method is accessible, some limited preparatory behavior, evidence of impaired self-control, severe dysphoria/symptomatology, multiple risk factors present, and few if any protective factors, particularly a lack of social support.  PLAN OF CARE: The patient was admitted for worsening psychotic and manic symptoms in the context of recent cocaine use and nonadherence to psychiatric medications.  Strongly suspect that a major contributing factor to current symptoms was recent cocaine use (UDS positive for cocaine on admission) since patient was back to baseline at discharge and has only been  out of the hospital for a few days prior to presentation for current admission.  Continue IVC status.  Continue every 15-minute safety checks.  Available lab  results reviewed.  Urine drug screen was positive for cocaine.  Urine pregnancy was negative.  Influenza A, influenza B and coronavirus testing were negative.  Patient refused blood draw this morning and no results for labs ordered with a.m. draw today.  EKG performed on the evening of 03/12/2021 showed normal sinus rhythm with sinus arrhythmia, ventricular rate of 63 and QT/QTc of 420/429.  Will continue olanzapine and increase to 20 mg nightly.  Continue Lithobid CR 300 mg every 12 hours.  Agitation protocol as per MAR.  Patient lacks insight into her psychiatric condition and has been intermittently agitated and aggressive requiring IM medications less than 24 hours into her hospital stay.  As a result of her current psychiatric symptoms, she is unable to participate meaningfully in conversations and decisions regarding medications.  Believe her current symptoms place her and others at potential risk of harm and believe the potential benefits of medications over objection for patient outweigh the potential risks.  I have asked Dr. Viann Fish to perform a second opinion regarding administration of medications over objection.  Estimated length of stay 5 to 7 days.  I certify that inpatient services furnished can reasonably be expected to improve the patient's condition.   Arthor Captain, MD 03/13/2021, 4:02 PM

## 2021-03-13 NOTE — Progress Notes (Signed)
[facility] Second Physician Opinion Progress Note for Medication Administration to Non-consenting Patients (For Involuntarily Committed Patients)  Patient: Gina Hooper Date of Birth: [birth year] MRN: [id number]  Reason for the Medication: The patient, without the benefit of the specific treatment measure, is incapable of participating in any available treatment plan that will give the patient a realistic opportunity of improving the patient's condition. There is, without the benefit of the specific treatment measure, a significant possibility that the patient will harm self or others before improvement of the patient's condition is realized.  Consideration of Side Effects: Consideration of the side effects related to the medication plan has been given.  Rationale for Medication Administration:  I met with this patient in her room on 03/13/21. She was initially sedated but upon awakening was unable to engage in meaningful conversation. She made mumbling statements about "[name]" and was disorganized and paranoid appearing during my interaction. She refused to answer when questioned about AVH, ideas of reference, or first rank symptoms. She would not cooperate for questioning about SI or HI.   According to her notes, she has been medication noncompliant since her most recent hospital discharge and initially presented to [facility] yesterday with evidence of paranoid delusions, flight of ideas, and illogical thoughts. Per staff, she has been refusing some of her medications since readmission, has been hyper-religious, and has been easily agitated on the unit. She was seen throwing items and slamming her door earlier this morning.   At this time, the patient currently is too symptomatic to engage in meaningful conversations regarding the treatment of her psychiatric illness and is unlikely to improve without administration of forced medications. She is not able to participate in a meaningful way in decisions  regarding medications or treatment. At this time I am in agreement that the patient needs psychotropic medications in order to have a realistic expectation for improving her paranoia/psychosis and managing aggression/agitation. It is my opinion that should the patient refuse medications that this would lead to more risk of potential self-harm or harm to others than if she was on a protocol for medications against objection.    Harlow Asa, MD, FAPA 03/13/21  4:32 PM   This documentation is good for (7) seven days from the date of the MD signature. New documentation must be completed every seven (7) days with detailed justification in the medical record if the patient requires continued non-emergent administration of psychotropic medications.

## 2021-03-13 NOTE — Plan of Care (Signed)
  Problem: Education: Goal: Ability to state activities that reduce stress will improve Outcome: Progressing   Problem: Coping: Goal: Ability to identify and develop effective coping behavior will improve Outcome: Progressing   Problem: Self-Concept: Goal: Ability to identify factors that promote anxiety will improve Outcome: Progressing   

## 2021-03-13 NOTE — BHH Group Notes (Signed)
Type of Therapy/Topic: Group Therapy: Emotion Regulation  Participation Level: Did Not Attend  Description of Group: The purpose of this group is to assist patients in learning to regulate negative emotions and experience positive emotions. Patients will be guided to discuss ways in which they have been vulnerable to their negative emotions. These vulnerabilities will be juxtaposed with experiences of positive emotions or situations, and patients will be challenged to use positive emotions to combat negative ones. Special emphasis will be placed on coping with negative emotions in conflict situations, and patients will process healthy conflict resolution skills.  Therapeutic Goals: 1. Patient will identify two positive emotions or experiences to reflect on in order to balance out negative emotions 2. Patient will label two or more emotions that they find the most difficult to experience 3. Patient will demonstrate positive conflict resolution skills through discussion and/or role plays  Summary of Patient Progress: Did not attend     Therapeutic Modalities: Cognitive Behavioral Therapy Feelings Identification Dialectical Behavioral Therapy

## 2021-03-13 NOTE — Progress Notes (Signed)
Pt visible on the unit some this evening . Pt took HS medication    03/13/21 2100  Psych Admission Type (Psych Patients Only)  Admission Status Involuntary  Psychosocial Assessment  Patient Complaints Anxiety  Eye Contact Fair;Watchful  Facial Expression Blank;Pensive;Worried  Affect Angry;Anxious;Irritable;Preoccupied  Speech Aggressive  Interaction Cautious;Defensive;Guarded;Hostile;Minimal  Motor Activity Slow  Appearance/Hygiene Unremarkable  Behavior Characteristics Cooperative;Anxious  Mood Suspicious;Labile;Anxious  Thought Process  Coherency Concrete thinking  Content Confabulation;Paranoia  Delusions Controlled;Paranoid  Perception Derealization  Hallucination None reported or observed  Judgment Poor  Confusion Moderate  Danger to Self  Current suicidal ideation? Denies  Danger to Others  Danger to Others None reported or observed

## 2021-03-13 NOTE — Progress Notes (Signed)
   03/13/21 0500  Sleep  Number of Hours 8.5

## 2021-03-14 LAB — CBC
HCT: 37.1 % (ref 36.0–46.0)
Hemoglobin: 11.8 g/dL — ABNORMAL LOW (ref 12.0–15.0)
MCH: 27.8 pg (ref 26.0–34.0)
MCHC: 31.8 g/dL (ref 30.0–36.0)
MCV: 87.3 fL (ref 80.0–100.0)
Platelets: 418 10*3/uL — ABNORMAL HIGH (ref 150–400)
RBC: 4.25 MIL/uL (ref 3.87–5.11)
RDW: 14.5 % (ref 11.5–15.5)
WBC: 9.8 10*3/uL (ref 4.0–10.5)
nRBC: 0 % (ref 0.0–0.2)

## 2021-03-14 LAB — LIPID PANEL
Cholesterol: 157 mg/dL (ref 0–200)
HDL: 44 mg/dL (ref >40)
LDL Cholesterol: 101 mg/dL — ABNORMAL HIGH (ref 0–99)
Total CHOL/HDL Ratio: 3.6 RATIO
Triglycerides: 59 mg/dL (ref <150)
VLDL: 12 mg/dL (ref 0–40)

## 2021-03-14 LAB — HCG, QUANTITATIVE, PREGNANCY: hCG, Beta Chain, Quant, S: 1 m[IU]/mL (ref <5)

## 2021-03-14 LAB — LITHIUM LEVEL: Lithium Lvl: 0.24 mmol/L — ABNORMAL LOW (ref 0.60–1.20)

## 2021-03-14 LAB — COMPREHENSIVE METABOLIC PANEL
ALT: 24 U/L (ref 0–44)
AST: 33 U/L (ref 15–41)
Albumin: 4.1 g/dL (ref 3.5–5.0)
Alkaline Phosphatase: 90 U/L (ref 38–126)
Anion gap: 12 (ref 5–15)
BUN: 8 mg/dL (ref 6–20)
CO2: 27 mmol/L (ref 22–32)
Calcium: 9.7 mg/dL (ref 8.9–10.3)
Chloride: 99 mmol/L (ref 98–111)
Creatinine, Ser: 0.69 mg/dL (ref 0.44–1.00)
GFR, Estimated: >60 mL/min (ref >60)
Glucose, Bld: 82 mg/dL (ref 70–99)
Potassium: 3.8 mmol/L (ref 3.5–5.1)
Sodium: 138 mmol/L (ref 135–145)
Total Bilirubin: 0.3 mg/dL (ref 0.3–1.2)
Total Protein: 7.9 g/dL (ref 6.5–8.1)

## 2021-03-14 LAB — HEMOGLOBIN A1C
Hgb A1c MFr Bld: 5.6 % (ref 4.8–5.6)
Mean Plasma Glucose: 114.02 mg/dL

## 2021-03-14 LAB — TSH: TSH: 0.523 u[IU]/mL (ref 0.350–4.500)

## 2021-03-14 LAB — T4, FREE: Free T4: 0.93 ng/dL (ref 0.61–1.12)

## 2021-03-14 MED ORDER — LORAZEPAM 2 MG/ML IJ SOLN
1.0000 mg | Freq: Three times a day (TID) | INTRAMUSCULAR | Status: DC | PRN
Start: 2021-03-14 — End: 2021-03-22

## 2021-03-14 MED ORDER — OLANZAPINE 5 MG PO TBDP
15.0000 mg | ORAL_TABLET | Freq: Every day | ORAL | Status: DC
  Administered 2021-03-14 – 2021-03-21 (×8): 15 mg via ORAL
  Filled 2021-03-14 (×9): qty 1

## 2021-03-14 MED ORDER — HALOPERIDOL LACTATE 5 MG/ML IJ SOLN: 5.0000 mg | Freq: Three times a day (TID) | INTRAMUSCULAR | Status: DC | PRN

## 2021-03-14 MED ORDER — OLANZAPINE 5 MG PO TBDP
5.0000 mg | ORAL_TABLET | Freq: Every day | ORAL | Status: DC
  Administered 2021-03-15 – 2021-03-18 (×4): 5 mg via ORAL
  Filled 2021-03-14 (×7): qty 1

## 2021-03-14 MED ORDER — HALOPERIDOL LACTATE 5 MG/ML IJ SOLN
5.0000 mg | Freq: Every day | INTRAMUSCULAR | Status: DC
  Filled 2021-03-14 (×6): qty 1

## 2021-03-14 MED ORDER — HALOPERIDOL LACTATE 5 MG/ML IJ SOLN
5.0000 mg | Freq: Every day | INTRAMUSCULAR | Status: DC
  Filled 2021-03-14 (×8): qty 1

## 2021-03-14 MED ORDER — HALOPERIDOL 5 MG PO TABS
5.0000 mg | ORAL_TABLET | Freq: Three times a day (TID) | ORAL | Status: DC | PRN
  Administered 2021-03-17 – 2021-03-18 (×2): 5 mg via ORAL
  Filled 2021-03-14 (×2): qty 1

## 2021-03-14 MED ORDER — LORAZEPAM 1 MG PO TABS
1.0000 mg | ORAL_TABLET | Freq: Three times a day (TID) | ORAL | Status: DC | PRN
  Administered 2021-03-17 – 2021-03-18 (×2): 1 mg via ORAL
  Filled 2021-03-14 (×2): qty 1

## 2021-03-14 MED ORDER — DIPHENHYDRAMINE HCL 50 MG/ML IJ SOLN: 50.0000 mg | Freq: Four times a day (QID) | INTRAMUSCULAR | Status: DC | PRN

## 2021-03-14 NOTE — Progress Notes (Signed)
Recreation Therapy Notes  Patient admitted to unit 7.19.22. Due to admission within last year, no new recreation therapy assessment conducted at this time. Last assessment conducted on 7.11.22.  Reason for current admission per patient, "nothing".  Pt expressed coping skills and leisure interests were the same.  Strengths and areas of improvement were the same as well.  Patient reports no stressors from previous admission.  Patient reports goal of "continue having a good life".  Patient denies SI, HI, AVH at this time.   Information found below from assessment conducted 7.11.22.   Coping Skills: Isolation, Sports, TV, Arguments, Aggression, Music, Exercise, Meditate, Deep Breathing, Talk, Art, Prayer, Read, Hot Bath/Shower  Leisure Interests: Walking, Sports, Skating, [place], Sit out in nature  Patient Strengths: Keeping smile on face; Music; Dancing; Church  Areas of Improvement: "Not really"    Caroll Rancher, LRT/CTRS      Caroll Rancher A 03/14/2021 12:59 PM

## 2021-03-14 NOTE — Progress Notes (Signed)
Pt presents with avertive eye contact, blunted affect and irritable mood on interactions. Pt isolative to her room most of this shift. OOB for meals and medications with prompts. Verbal outburst X 1, observed with increased paranoia, suspicions and was verbally abusive towards Clinical research associate and evening female nurse in dayroom. Per pt "Get your asses out of this room, none of these men in here want y'all. They are all fucking married, stop touching another woman's man. Y'all are some fucking bitches. I will fire your asses from my fucking establishment right now" while walking towards evening nurse with her fingers pointed at her. Required verbal redirections from female MHT staff to cooperate with instructions. Pt is medication compliant and mouth checks done with female presence to ensure compliance. Pt denies SI, HI, AVH and pain when assessed but is remains disorganized. Support and encouragement provided to pt this shift. All medications given with verbal education and effects monitored. Q 15 minutes safety checks maintained without self harm gestures.  Pt attended one group this shift, left early but was cooperative. Tolerates meals, fluids and medications well without discomfort to note at this time.

## 2021-03-14 NOTE — Progress Notes (Signed)
Pt visible on the unit, pt cooperative with medications    03/14/21 2100  Psych Admission Type (Psych Patients Only)  Admission Status Involuntary  Psychosocial Assessment  Patient Complaints Anxiety  Eye Contact Fair;Watchful  Facial Expression Blank;Pensive;Worried  Affect Appropriate to circumstance  Speech Pressured;Argumentative  Interaction Defensive;Guarded;Superficial  Motor Activity Slow  Appearance/Hygiene Unremarkable  Behavior Characteristics Cooperative  Mood Labile;Suspicious  Thought Process  Coherency Concrete thinking  Content Confabulation;Paranoia  Delusions Controlled;Paranoid  Perception Derealization  Hallucination None reported or observed  Judgment Poor  Confusion Moderate  Danger to Self  Current suicidal ideation? Denies  Danger to Others  Danger to Others None reported or observed

## 2021-03-14 NOTE — BHH Group Notes (Signed)
Adult Psychoeducational Group Note  Date:  03/14/2021 Time:  8:40 PM  Group Topic/Focus:  Healthy Communication:   The focus of this group is to discuss communication, barriers to communication, as well as healthy ways to communicate with others.  Participation Level:  Active  Participation Quality:  Appropriate  Affect:  Appropriate  Cognitive:  Alert  Insight: Appropriate and Good  Engagement in Group:  Engaged  Modes of Intervention:  Discussion  Additional Comments  Jacalyn Lefevre 03/14/2021, 8:40 PM

## 2021-03-14 NOTE — Progress Notes (Signed)
   03/14/21 0500  Sleep  Number of Hours 8

## 2021-03-14 NOTE — Progress Notes (Signed)
Recreation Therapy Notes  Date: 7.21.22 Time: 1000 Location: 500 Hall Dayroom   Group Topic: Decision Making, Problem Solving, Communication   Goal Area(s) Addresses:  Patient will effectively work with peer towards shared goal. Patient will identify factors that guided their decision making. Patient will pro-socially communicate ideas during group session.   Behavioral Response: Engaged   Intervention: Survival Scenario - pencil, paper   Activity: Patients were given a scenario that they were going to into space for several months and needed to bring 15 things necessary for their "survival". The word survival was not defined for the patient, allowing for open interpretation and self-exploration of current values.The list of items selected was prioritized most important to least. Each patient would come up with their own list, then work together to create a combined list of 15 items with a small group of 3-5 peers. LRT discussed each persons list and how it differed from others. The debrief included discussion of priorities, good decisions versus bad decisions, and how it is important to think before acting so we can make the best decision possible.   Education: Pharmacist, community, Priorities, Support System, Discharge Planning   Education Outcome: Acknowledges education/In group clarification/Needs additional education   Clinical Observations/Feedback: Pt arrived late to group.  Pt decided on items like air, paper, soap, sun, stars, moon, etc.  Pt was  content with the items picked for her list.  Pt was attentive during processing.         Caroll Rancher, LRT/CTRS      Caroll Rancher A 03/14/2021 12:44 PM
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
## 2021-03-14 NOTE — [facility] Group Notes (Signed)
The focus of this group is to help patients establish daily goals to achieve during treatment and discuss how the patient can incorporate goal setting into their daily lives to aide in recovery.  Pt did not attend group 

## 2021-03-14 NOTE — Progress Notes (Signed)
Psychoeducational Group Note  Date:  03/14/2021 Time:  2425  Group Topic/Focus:  Wrap-Up Group:   The focus of this group is to help patients review their daily goal of treatment and discuss progress on daily workbooks.  Participation Level: Did Not Attend  Participation Quality:  Not Applicable  Affect:  Not Applicable  Cognitive:  Not Applicable  Insight:  Not Applicable  Engagement in Group: Not Applicable  Additional Comments:  The patient did not attend group last evening.   Tamiyah Moulin S 03/14/2021, 12:24 AM

## 2021-03-14 NOTE — B[facility] Group Notes (Signed)
Occupational Therapy Group Note Date: 03/14/2021 Group Topic/Focus: Stress Management  Group Description: Group encouraged increased participation and engagement through discussion focused on topic of stress management. Patients engaged interactively to discuss components of stress including physical signs, emotional signs, negative management strategies, and positive management strategies. Each individual identified one new stress management strategy they would like to try moving forward.    Therapeutic Goals: Identify current stressors Identify healthy vs unhealthy stress management strategies/techniques Discuss and identify physical and emotional signs of stress Participation Level: Patient did not attend OT group session despite personal invitation.   Plan: Continue to engage patient in OT groups 2 - 3x/week.  03/14/2021  Dayrin Stallone, MOT, OTR/L  

## 2021-03-14 NOTE — [facility] Counselor (Signed)
Adult Comprehensive Assessment  Patient ID: Gina Hooper, female   DOB: 05/27/1991, 30 y.o.   MRN: [id number]  Information Source: Information source: Patient   Current Stressors:  Patient states their primary concerns and needs for treatment are:: "I don't remember what brought me to the hospital" Patient states their goals for this hospitilization and ongoing recovery are:: "To go home" Educational / Learning stressors: Pt reports an 11th grade education Employment / Job issues: Pt reports being unemployed Family Relationships: Pt reports no stressors Surveyor, quantity / Lack of resources (include bankruptcy): Pt reports no stressors Housing / Lack of housing: Pt reports living with her grandmother Physical health (include injuries & life threatening diseases): Pt reports medical concerns with her Liver Social relationships: Pt reports no stressors Substance abuse: Pt denies all substance use Bereavement / Loss: Pt reports no stressors   Living/Environment/Situation:  Living Arrangements: Other relatives Living conditions (as described by patient or guardian): "It is loving and supportive" Who else lives in the home?: Grandmother, great-grandmother, great-grandfather, and sister How long has patient lived in current situation?: "My entire life" What is atmosphere in current home: Supportive, Comfortable   Family History:  Marital status: Single Are you sexually active?: Yes What is your sexual orientation?: Heterosexual Has your sexual activity been affected by drugs, alcohol, medication, or emotional stress?: No Does patient have children?: Yes How many children?: 1 How is patient's relationship with their children?: "I have an 43 year old daughter and we get along great but she lives with her paternal grandmother"   Childhood History:  By whom was/is the patient raised?: Grandparents Additional childhood history information: Pt reports her parents were around often but she could  not live with them, Pt did not disclose any other information Description of patient's relationship with caregiver when they were a child: "We got along great" Patient's description of current relationship with people who raised him/her: "We all still get along great" How were you disciplined when you got in trouble as a child/adolescent?: Spankings Does patient have siblings?: Yes Number of Siblings: 4 Description of patient's current relationship with siblings: "I have 3 brothers and a sister and we all get along great" Did patient suffer any verbal/emotional/physical/sexual abuse as a child?: No Did patient suffer from severe childhood neglect?: No Has patient ever been sexually abused/assaulted/raped as an adolescent or adult?: No Was the patient ever a victim of a crime or a disaster?: No Witnessed domestic violence?: No Has patient been affected by domestic violence as an adult?: No   Education:  Highest grade of school patient has completed: 11th grade Currently a student?: No Learning disability?: No   Employment/Work Situation:   Employment Situation: Unemployed Patient's Job has Been Impacted by Current Illness: No What is the Longest Time Patient has Held a Job?: N/A Where was the Patient Employed at that Time?: Pt reports no previous employment Has Patient ever Been in the [employer]?: No   Financial Resources:   Surveyor, quantity resources: Support from parents / caregiver, Sales executive, Medicaid Does patient have a Lawyer or guardian?: No   Alcohol/Substance Abuse:   What has been your use of drugs/alcohol within the last 12 months?: Pt denies all substance use If attempted suicide, did drugs/alcohol play a role in this?: No Alcohol/Substance Abuse Treatment Hx: Denies past history Has alcohol/substance abuse ever caused legal problems?: No   Social Support System:   Patient's Community Support System: Good Describe Community Support System: Grandmother, family,  friends Type of faith/religion:  Christian How does patient's faith help to cope with current illness?: Church and prayer   Leisure/Recreation:   Do You Have Hobbies?: Yes Leisure and Hobbies: Dancing, running, basketball, and sports   Strengths/Needs:   What is the patient's perception of their strengths?: "I like to laugh" Patient states they can use these personal strengths during their treatment to contribute to their recovery: "It makes other people smile so then I smile" Patient states these barriers may affect/interfere with their treatment: None Patient states these barriers may affect their return to the community: None Other important information patient would like considered in planning for their treatment: None   Discharge Plan:   Currently receiving community mental health services: Yes (From Whom) ([facility]iatry at [facility]) Patient states concerns and preferences for aftercare planning are: Pt would like to keep her Psychatrist at [facility] and add therapy from [facility] as well Patient states they will know when they are safe and ready for discharge when: "I am safe and ready to leave now" Does patient have access to transportation?: Yes (Grandmother) Does patient have financial barriers related to discharge medications?: No Will patient be returning to same living situation after discharge?: Yes   Summary/Recommendations:   Summary and Recommendations (to be completed by the evaluator): Gina Hooper is a 30 year old, female, who was admitted to the hospital due to Paranoia and Delusions. The Pt reports that she does not recall why she is in the hospital but states that she is not experiencing SI or HI at this time.  The Pt reports that all of her information is the same and that nothing has changed since her previous admission.  Please see summary below.   Previous Admission from 03/07/2021 The Pt reports living with her grandmother, great-grandmother, great-grandfather, and sister.  The  Pt did not mention her Fiance during this interview.  The Pt reports having a 50 year old daughter that lives with her paternal grandmother and denies any family conflicts, past or present.  The Pt reports being unemployed but denies financial stressors stating that she has applied for SSDI and receives foodstamps and Medicaid.  The Pt reports her only stressor is an unknown issue with her Liver.  The Pt did not disclose any further health information.  The Pt also denies all substance use and any previous substance use treatment.  While in the hospital the Pt can benefit from crisis stabilization, medication management, group therapy, psycho-education, case management, and discahrge planning.  Upon discharge the Pt would like to continue her medication management with [facility] and would like to add therapy at the [facility] as well.    Aram Beecham. 03/14/2021

## 2021-03-15 MED ORDER — LITHIUM CARBONATE ER 450 MG PO TBCR
450.0000 mg | EXTENDED_RELEASE_TABLET | Freq: Two times a day (BID) | ORAL | Status: DC
  Administered 2021-03-15 – 2021-03-22 (×15): 450 mg via ORAL
  Filled 2021-03-15 (×5): qty 1
  Filled 2021-03-15: qty 14
  Filled 2021-03-15 (×10): qty 1
  Filled 2021-03-15: qty 14
  Filled 2021-03-15 (×2): qty 1

## 2021-03-15 NOTE — Progress Notes (Signed)
Pt in the dayroom stating she is going to fire staff, pt irritable needing re-direction

## 2021-03-15 NOTE — Progress Notes (Signed)
Patient refused to go for CT Scan stating " I am waiting for my boyfriend to go home. I am not doing it not even the President will do that, I am not getting my self arrested for doing a CT Scan on my head" Patient educated on the CT Scan, Support and encouragement provided as needed. Provider notified.

## 2021-03-15 NOTE — Progress Notes (Signed)
Adult Psychoeducational Group Note  Date:  03/15/2021 Time:  9:06 PM  Group Topic/Focus:  Wrap-Up Group:   The focus of this group is to help patients review their daily goal of treatment and discuss progress on daily workbooks.  Participation Level:  Active  Participation Quality:  Appropriate and Attentive  Affect:  Appropriate  Cognitive:  Alert and Appropriate  Insight: Appropriate and Good  Engagement in Group:  Engaged and Supportive  Modes of Intervention:  Discussion and Support  Additional Comments: Pt articulated that her goal for today was to focus on her treatment plan and participate more in group sessions. This goal was accomplished. Pt stated she did talk with a medical doctor about her care. Pt conveyed she made a phone call to her friend and reported relationship with her family and support system was exceptionally good. Pt reported she took all medications provided and attended all meal services with 100% intake. Pt said her appetite was improving today. Pt evaluated her sleep last night as improving. Pt verbalized she felt good about herself and rated her overall day a 10 out of 10 on this date. The patient provided positive statments and supported her peers in the group session. Pt articulated she had no physical pain. Pt denies no auditory or visual hallucinations or thoughts of harming herself or others. Pt said she would alert staff if anything changed. End of Wrap-Up Group progress report.   Nicoletta Dress 03/15/2021, 9:06 PM

## 2021-03-15 NOTE — [facility] Group Notes (Signed)
LCSW Aftercare Discharge Planning Group Note   Type of Group and Topic: Psychoeducational Group: Discharge Planning  Participation Level: Active  Description of Group  Discharge planning group reviews patient's anticipated discharge plans and assists patients to anticipate and address any barriers to wellness/recovery in the community. Suicide prevention education is reviewed with patients in group.  Therapeutic Goals  1. Patients will state their anticipated discharge plan and mental health aftercare  2. Patients will identify potential barriers to wellness in the community setting  3. Patients will engage in problem solving, solution focused discussion of ways to anticipate and address barriers to wellness/recovery  Summary of Patient Progress: Pt attended and participated in group. Pt shared and encouraged other pt's. Pt often tried to monopolize group and was often disorganized and off topic. Pt shared that her supports are her grandmother, a good friend and herself. She states her motivators are to continue to move forward and her own identity.    Therapeutic Modalities: Motivational Interviewing

## 2021-03-15 NOTE — Progress Notes (Signed)
Pt visible on the unit this evening, pt continues to appear to be responding at times. Pt pleasant on unit , took PO medication without incident    03/15/21 2100  Psych Admission Type (Psych Patients Only)  Admission Status Involuntary  Psychosocial Assessment  Patient Complaints Anxiety  Eye Contact Fair;Watchful  Facial Expression Blank;Pensive;Worried  Affect Appropriate to circumstance  Speech Pressured;Argumentative  Interaction Defensive;Guarded;Superficial  Motor Activity Slow  Appearance/Hygiene Unremarkable  Behavior Characteristics Cooperative  Mood Labile;Anxious;Preoccupied;Pleasant;Suspicious  Thought Process  Coherency Concrete thinking  Content Confabulation;Paranoia  Delusions Controlled;Paranoid  Perception Derealization  Hallucination None reported or observed  Judgment Poor  Confusion Moderate  Danger to Self  Current suicidal ideation? Denies  Danger to Others  Danger to Others None reported or observed

## 2021-03-15 NOTE — [facility] Suicide Risk Assessment (Signed)
[facility] INPATIENT:  Family/Significant Other Suicide Prevention Education  Suicide Prevention Education:  Education Completed; Marion Downer [phone] [facility]) has been identified by the patient as the family member/significant other with whom the patient will be residing, and identified as the person(s) who will aid the patient in the event of a mental health crisis (suicidal ideations/suicide attempt).  With written consent from the patient, the family member/significant other has been provided the following suicide prevention education, prior to the and/or following the discharge of the patient.  The suicide prevention education provided includes the following: Suicide risk factors Suicide prevention and interventions National Suicide Hotline telephone number [place] assessment telephone number [facility] Emergency Assistance 911 [facility] and/or Residential Mobile Crisis Unit telephone number  Request made of family/significant other to: Remove weapons (e.g., guns, rifles, knives), all items previously/currently identified as safety concern.   Remove drugs/medications (over-the-counter, prescriptions, illicit drugs), all items previously/currently identified as a safety concern.  The family member/significant other verbalizes understanding of the suicide prevention education information provided.  The family member/significant other agrees to remove the items of safety concern listed above.  CSW spoke with Mr. Allyne Gee who states that his Fiance stopped taking her medications on Saturday night and by Monday was "talking about things that did not make any sense".  Mr. Allyne Gee states that his Fiance's symptoms started approximately 2 months ago but states that he has been with her for 1 year.  He states that his Fiance was taking her mediations regularly until 2 months ago.  He also states that she often hides her medications under her tounge and spits them out when he  turns around.  Mr. Allyne Gee states that his Fiance does not work and stays at home, alone, during the day.  Mr. Allyne Gee confirms that his Fiance lives with him and that she can come home when she is discharged from the hospital.  Mr. Allyne Gee states that there are no firearms in the home.  Mr. Allyne Gee would like the staff to speak with his Fiance about agreeing to ACTT services.  CSW completed SPE with Mr. Allyne Gee.   Metro Kung Shawne Bulow 03/15/2021, 2:05 PM
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
## 2021-03-15 NOTE — Progress Notes (Signed)
Recreation Therapy Notes  Date: 7.22.22 Time: 1010 Location: [address] Dayroom  Group Topic: Communication, Team Building, Problem Solving  Goal Area(s) Addresses:  Patient will effectively work with peer towards shared goal.  Patient will identify skills used to make activity successful.  Patient will identify how skills used during activity can be used to reach post d/c goals.   Behavioral Response: Active  Intervention: STEM Activity  Activity: Straw Bridge. In teams of 3-5, patients were given 15 plastic drinking straws and an equal length of masking tape. Using the materials provided, patients were instructed to build a free standing bridge-like structure to suspend an everyday item (ex: puzzle box) off of the floor or table surface. All materials were required to be used by the team in their design. LRT facilitated post-activity discussion reviewing team process. Patients were encouraged to reflect how the skills used in this activity can be generalized to daily life post discharge.   Education: Pharmacist, community, Scientist, physiological, Discharge Planning   Education Outcome: Acknowledges education/In group clarification offered/Needs additional education.   Clinical Observations/Feedback: Pt was attentive and made suggestions to group members to complete activity.  Pt drew a sketch to show peers what their idea would look like.  Pt watched as peers put the structure together.  Pt was appropriate and bright during activity.     Caroll Rancher, LRT/CTRS         Caroll Rancher A 03/15/2021 12:23 PM

## 2021-03-15 NOTE — B[facility] Group Notes (Signed)
The focus of this group is to help patients establish daily goals to achieve during treatment and discuss how the patient can incorporate goal setting into their daily lives to aide in recovery.  Pt attended group, her goal is to, " Pull my retirement out and work with the less fortunate."

## 2021-03-16 ENCOUNTER — Inpatient Hospital Stay (HOSPITAL_COMMUNITY): Admit: 2021-03-16 | Payer: Self-pay

## 2021-03-16 DIAGNOSIS — F1910 Other psychoactive substance abuse, uncomplicated: Secondary | ICD-10-CM

## 2021-03-16 DIAGNOSIS — F19959 Other psychoactive substance use, unspecified with psychoactive substance-induced psychotic disorder, unspecified: Secondary | ICD-10-CM

## 2021-03-16 NOTE — Progress Notes (Signed)
Ct scan rescheduled for Monday and 2 p.m.
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
## 2021-03-16 NOTE — Progress Notes (Signed)
D- Patient alert and oriented. Denies SI, HI, and pain.   A- Scheduled medications administered to patient, per MD orders. Support and encouragement provided.  Routine safety checks conducted every 15 minutes.  Patient informed to notify staff with problems or concerns.  R- No adverse drug reactions noted. Patient contracts for safety at this time. Patient compliant with medications and treatment plan. Patient receptive, calm, and cooperative. Patient interacts well with others on the unit.  Patient remains safe at this time.   03/16/21 1210  Charting Type  Charting Type Shift assessment  Assessment of needs addressed Yes  Orders Chart Check (once per shift) Completed  Safety Check Verification  Has the RN verified the 15 minute safety check completion? Yes  Neurological  Neuro (WDL) WDL  HEENT  HEENT (WDL) WDL  Respiratory  Respiratory (WDL) WDL  Vascular  Vascular (WDL) WDL  Integumentary  Integumentary (WDL) WDL  Braden Scale (Ages 8 and up)  Sensory Perceptions 4  Moisture 4  Activity 4  Mobility 4  Nutrition 3  Friction and Shear 3  Braden Scale Score 22  Musculoskeletal  Musculoskeletal (WDL) WDL  Gastrointestinal  Gastrointestinal (WDL) WDL  GU Assessment  Genitourinary (WDL) WDL

## 2021-03-16 NOTE — [facility] Group Notes (Signed)
LCSW Group Therapy Note  09/29/2020   11:30am-12:00pm  Topic:  Anger Triggers and Coping Skills  Participation Level:  Active  Description of Group:   In this group, patients learned how to recognize the physical, cognitive, emotional, and behavioral responses they have to anger-provoking situations.  They identified their own common triggers and typical reactions then analyzed how these reactions are possibly beneficial and possibly unhelpful.  Focus was placed on how helpful it is to recognize the underlying emotions to anger in order to address these for more permanent resolution.  Emphasis was also on identifying possible replacement thoughts for the automatic thoughts generated in various situations shared by the group.  Therapeutic Goals: Patients will share situations that commonly incite their anger and how they typically respond Patients will identify how their coping skills work for them and/or against them Patients will explore possible alternative thoughts to their automatic ones Patients will learn that anger itself is normal and that healthier reactions can assist with resolving conflict rather than worsening situations  Summary of Patient Progress:  The patient shared that her frequent cause anger is "people's mouths" and said her irritation is really when people interrupt and do not listen.  Their volume and tone of voice can be part of the irritation, as can be them not giving her a chance to talk.  She stated she has developed the technique of staying calm and shutting down, because this allows the other person to keep talking and eventually it lets them "see themselves."  She insisted despite being asked several times in different ways that she does not get angry anymore and that she "just knows" how to calm herself.  Therapeutic Modalities:   Cognitive Behavioral Therapy Processing  Lynnell Chad

## 2021-03-16 NOTE — [facility] Group Notes (Signed)
Adult Psychoeducational Group Note  Date:  03/16/2021 Time:  8:15 PM  Group Topic/Focus:  Coping With Mental Health Crisis:   The purpose of this group is to help patients identify strategies for coping with mental health crisis.  Group discusses possible causes of crisis and ways to manage them effectively.  Participation Level:  Active  Participation Quality:  Appropriate and Attentive  Affect:  Appropriate  Cognitive:  Alert and Appropriate  Insight: Good  Engagement in Group:  Developing/Improving and Engaged  Modes of Intervention:  Discussion  Additional Comments  Jacalyn Lefevre 03/16/2021, 8:15 PM

## 2021-03-16 NOTE — [facility] Group Notes (Signed)
[facility] Group Notes:  (Nursing/MHT/Case Management/Adjunct)  Date:  03/16/2021  Time:  11:06 AM  Type of Therapy:  Group Therapy  Participation Level:  Active  Participation Quality:  Appropriate  Affect:  Labile  Cognitive:  Confused and Delusional  Insight:  Improving  Engagement in Group:  Engaged  Modes of Intervention:  Orientation  Summary of Progress/Problems: Pt goal for today is to take all of her medications and to be able to relax.   Lopez Dentinger J Quina Wilbourne 03/16/2021, 11:06 AM

## 2021-03-16 NOTE — Progress Notes (Signed)
Ok per Dr. Mason Jim, wait till Monday 7/25 for Head CT.

## 2021-03-16 NOTE — Progress Notes (Signed)
   03/16/21 0500  Sleep  Number of Hours 7.5   

## 2021-03-17 NOTE — Progress Notes (Signed)
D- Patient alert and oriented. Affect/mood reported as increased agitation. Patient is pre-occupied with being discharged today. Denies SI, HI, AVH, and pain.   A- Scheduled medications administered to patient, per MD orders. Support and encouragement provided.  Routine safety checks conducted every 15 minutes.  Patient informed to notify staff with problems or concerns.  R- No adverse drug reactions noted. Patient contracts for safety at this time. Patient compliant with medications and treatment plan. Patient receptive, calm, and cooperative after PRN medications was given. Patient remains safe at this time.    03/17/21 0953  Charting Type  Charting Type Shift assessment  Assessment of needs addressed Yes  Orders Chart Check (once per shift) Completed  Safety Check Verification  Has the RN verified the 15 minute safety check completion? Yes  Neurological  Neuro (WDL) WDL  HEENT  HEENT (WDL) WDL  Respiratory  Respiratory (WDL) WDL  Cardiac  Cardiac (WDL) WDL  Vascular  Vascular (WDL) WDL  Braden Scale (Ages 8 and up)  Sensory Perceptions 4  Moisture 4  Activity 4  Mobility 4  Nutrition 3  Friction and Shear 3  Braden Scale Score 22  Musculoskeletal  Musculoskeletal (WDL) WDL  Gastrointestinal  Gastrointestinal (WDL) WDL  GU Assessment  Genitourinary (WDL) WDL  Neurological  Level of Consciousness Alert
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
## 2021-03-17 NOTE — Progress Notes (Signed)
Adult Psychoeducational Group Note  Date:  03/17/2021 Time:  9:05 PM  Group Topic/Focus:  Wrap-Up Group:   The focus of this group is to help patients review their daily goal of treatment and discuss progress on daily workbooks.  Participation Level: Did Not Attend  Participation Quality: Did Not Attend  Affect: Did Not Attend  Cognitive: Did Not Attend  Insight: None  Engagement in Group: Did Not Attend  Modes of Intervention: Did Not Attend  Additional Comments: Pt did not attend evening wrap up group tonight.  Nicoletta Dress 03/17/2021, 9:05 PM

## 2021-03-17 NOTE — Plan of Care (Signed)
  Problem: Coping: Goal: Ability to identify and develop effective coping behavior will improve Outcome: Progressing   Problem: Self-Concept: Goal: Ability to identify factors that promote anxiety will improve Outcome: Progressing   

## 2021-03-17 NOTE — Progress Notes (Addendum)
PRN  Ativan p.o. given for agitation as ordered. Patient was observed standing on the chair in the dayroom and was drawing on the walls. Patient also stated " this is my hospital and you don't work here" " I will have to hire you before you can give me any medication". Patient also stated that she is being discharged today no matter what.   Staff will continue to monitor for changes in her behavior/condition.

## 2021-03-17 NOTE — Progress Notes (Signed)
   03/16/21 2200  Psych Admission Type (Psych Patients Only)  Admission Status Involuntary  Psychosocial Assessment  Patient Complaints Anxiety  Eye Contact Fair;Watchful  Facial Expression Blank;Pensive;Worried  Affect Appropriate to circumstance  Associate Professor Activity Slow  Appearance/Hygiene Unremarkable  Behavior Characteristics Cooperative  Mood Anxious  Thought Process  Coherency WDL  Content Paranoia  Delusions None reported or observed  Perception Derealization  Hallucination None reported or observed  Judgment Poor  Confusion Moderate  Danger to Self  Current suicidal ideation? Denies  Danger to Others  Danger to Others None reported or observed   Patient compliant with medication. Denies SI/HI/ Patient appears to be responding to internal stimuli. Support and encouragement provided.

## 2021-03-17 NOTE — Progress Notes (Signed)
PRN Haldol p.o. given for agitation. Patient was threatening to leave the unit to be discharged. Staff will continue to monitor for changes in condition/behavior.

## 2021-03-17 NOTE — [facility] Group Notes (Signed)
[facility] Group Notes:  (Nursing/MHT/Case Management/Adjunct)  Date:  03/17/2021  Time:  10:31 AM  Type of Therapy:  Group Therapy  Participation Level:  Active  Participation Quality:  Appropriate  Affect:  Appropriate  Cognitive:  Appropriate  Insight:  Appropriate  Engagement in Group:  Engaged  Modes of Intervention:  Orientation  Summary of Progress/Problems:Pt goal for today is to stay focused with a clear mind.   Gina Hooper J Glennis Montenegro 03/17/2021, 10:31 AM

## 2021-03-17 NOTE — Progress Notes (Signed)
Pt has been fixated on leaving ever since early this morning. She has been told by staff that she is not ready for discharge. 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 Pt has packed all belongings and brought them to the dayroom. Pt is standing in chairs and writing her signature on the wall in pencil and marker. Pt was redirected consistently and was given medication. Staff will continue to monitor and redirect as needed.

## 2021-03-17 NOTE — [facility] Group Notes (Signed)
[facility] LCSW Group Therapy Note  Date/Time:  03/17/2021  11:00AM-12:00PM  Type of Therapy and Topic:  Group Therapy:  Music and Mood  Participation Level:  Active   Description of Group: In this process group, members listened to a variety of genres of music and identified that different types of music evoke different responses.  Patients were encouraged to identify music that was soothing for them and music that was energizing for them.  Patients discussed how this knowledge can help with wellness and recovery in various ways including managing depression and anxiety as well as encouraging healthy sleep habits.    Therapeutic Goals: Patients will explore the impact of different varieties of music on mood Patients will verbalize the thoughts they have when listening to different types of music Patients will identify music that is soothing to them as well as music that is energizing to them Patients will discuss how to use this knowledge to assist in maintaining wellness and recovery Patients will explore the use of music as a coping skill  Summary of Patient Progress:  At the beginning of group, patient expressed that she felt grateful.  She was appropriate throughout group except in one song where she sang so loudly that the song itself could not be heard.  At the end of group, patient expressed that she felt "Yoga."  When CSW stated that is not a feeling, she stated that it is, and it means "relaxed nerves and brain.".    Therapeutic Modalities: Solution Focused Brief Therapy Activity   Ambrose Mantle, LCSW

## 2021-03-18 ENCOUNTER — Other Ambulatory Visit (HOSPITAL_COMMUNITY): Payer: Self-pay

## 2021-03-18 LAB — COMPREHENSIVE METABOLIC PANEL
ALT: 35 U/L (ref 0–44)
AST: 31 U/L (ref 15–41)
Albumin: 4.1 g/dL (ref 3.5–5.0)
Alkaline Phosphatase: 81 U/L (ref 38–126)
Anion gap: 8 (ref 5–15)
BUN: 14 mg/dL (ref 6–20)
CO2: 25 mmol/L (ref 22–32)
Calcium: 9.7 mg/dL (ref 8.9–10.3)
Chloride: 105 mmol/L (ref 98–111)
Creatinine, Ser: 0.71 mg/dL (ref 0.44–1.00)
GFR, Estimated: >60 mL/min (ref >60)
Glucose, Bld: 91 mg/dL (ref 70–99)
Potassium: 3.9 mmol/L (ref 3.5–5.1)
Sodium: 138 mmol/L (ref 135–145)
Total Bilirubin: 0.3 mg/dL (ref 0.3–1.2)
Total Protein: 7.5 g/dL (ref 6.5–8.1)

## 2021-03-18 LAB — LITHIUM LEVEL: Lithium Lvl: 0.63 mmol/L (ref 0.60–1.20)

## 2021-03-18 MED ORDER — HALOPERIDOL 5 MG PO TABS
5.0000 mg | ORAL_TABLET | ORAL | Status: AC
  Administered 2021-03-18: 5 mg via ORAL

## 2021-03-18 MED ORDER — HALOPERIDOL 5 MG PO TABS
5.0000 mg | ORAL_TABLET | Freq: Two times a day (BID) | ORAL | Status: DC
  Administered 2021-03-19 – 2021-03-20 (×3): 5 mg via ORAL
  Filled 2021-03-18 (×7): qty 1

## 2021-03-18 MED ORDER — BENZTROPINE MESYLATE 0.5 MG PO TABS: 0.5000 mg | ORAL_TABLET | Freq: Four times a day (QID) | ORAL | Status: DC | PRN

## 2021-03-18 MED ORDER — TRAZODONE HCL 50 MG PO TABS
50.0000 mg | ORAL_TABLET | Freq: Every evening | ORAL | Status: DC | PRN
  Administered 2021-03-18 – 2021-03-21 (×3): 50 mg via ORAL
  Filled 2021-03-18 (×3): qty 1

## 2021-03-18 MED ORDER — HALOPERIDOL 5 MG PO TABS
ORAL_TABLET | ORAL | Status: AC
  Filled 2021-03-18: qty 1

## 2021-03-18 NOTE — Progress Notes (Signed)
Adult Psychoeducational Group Note  Date:  03/18/2021 Time:  9:27 PM  Group Topic/Focus:  Wrap-Up Group:   The focus of this group is to help patients review their daily goal of treatment and discuss progress on daily workbooks.  Participation Level:  Minimal  Participation Quality:  Drowsy  Affect:  Anxious and Irritable  Cognitive:  Disorganized  Insight: Limited  Engagement in Group:  Limited  Modes of Intervention:  Discussion  Additional Comments:  Pt stated her goal for today was to focus on her treatment plan. Pt stated she accomplished her goal today. Pt stated she talked with her doctor and her social worker about her care today. Pt rated her overall day a 7 out of 10. Pt stated she was able to contact her Grandmother today which improved her overall day. Pt stated she felt better about herself tonight. Pt stated she was able to attend all groups held today. Pt stated she was able to attend all meals. Pt stated she took all medications provided today. Pt stated her appetite was pretty good today. Pt rated her sleep last night was pretty good. Pt stated the goal tonight was to get some rest. Pt stated she had no physical pain today. Pt deny visual hallucinations and auditory issues tonight.  Pt denies thoughts of harming herself or others. Pt stated she would alert staff if anything changed  Felipa Furnace 03/18/2021, 9:27 PM

## 2021-03-18 NOTE — Progress Notes (Signed)
   03/18/21 1800  Psych Admission Type (Psych Patients Only)  Admission Status Involuntary  Psychosocial Assessment  Patient Complaints Anxiety;Suspiciousness  Eye Contact Watchful;Suspiciousness  Facial Expression Anxious  Affect Anxious;Preoccupied  Speech Logical/coherent  Interaction Forwards little;Guarded;Cautious  Motor Activity Slow  Appearance/Hygiene Unremarkable  Behavior Characteristics Cooperative;Anxious  Mood Anxious;Preoccupied;Suspicious  Aggressive Behavior  Effect No apparent injury  Thought Process  Coherency Disorganized;Tangential  Content Delusions;Paranoia  Delusions Grandeur;Paranoid  Perception Derealization  Hallucination None reported or observed  Judgment Poor  Confusion Moderate  Danger to Self  Current suicidal ideation? Denies  Danger to Others  Danger to Others None reported or observed

## 2021-03-18 NOTE — [facility] Group Notes (Signed)
[facility] LCSW Group Therapy  03/18/2021 2:35 PM  Type of Therapy:  Group Therapy: Stress Exploration   Participation Level:  Did Not Attend   Summary of Progress/Problems:  Did not attend  Gina Hooper 03/18/2021, 2:35 PM  

## 2021-03-18 NOTE — [facility] Group Notes (Signed)
[facility] Group Notes:  (Nursing/MHT/Case Management/Adjunct)  Date:  03/18/2021  Time:  10:01 AM  Type of Therapy:  Group Therapy  Participation Level:  Active  Participation Quality:  Attentive  Affect:  Irritable  Cognitive:  Disorganized and Delusional  Insight:  Lacking  Engagement in Group:  Lacking and Off Topic  Modes of Intervention:  Orientation  Summary of Progress/Problems: Pt goal for today is to get released with her consent since she states that she is the head of the hospital and that she has the outside world to attend to since she runs the world.   Edger Husain J Dujuan Stankowski 03/18/2021, 10:01 AM

## 2021-03-18 NOTE — Progress Notes (Signed)
   03/18/21 2100  Psych Admission Type (Psych Patients Only)  Admission Status Involuntary  Psychosocial Assessment  Patient Complaints None  Eye Contact Watchful;Suspiciousness  Facial Expression Flat  Affect Preoccupied  Speech Logical/coherent  Interaction Forwards little;Guarded;Cautious  Motor Activity Slow  Appearance/Hygiene Unremarkable  Behavior Characteristics Cooperative  Mood Anxious;Preoccupied  Thought Process  Coherency WDL  Content Paranoia  Delusions None reported or observed  Perception WDL  Hallucination None reported or observed  Judgment Poor  Confusion UTA  Danger to Self  Current suicidal ideation? Denies  Danger to Others  Danger to Others None reported or observed   Pt sitting in dayroom. Pt denies SI, HI, AVH and pain. Pt says she feels better since she was given something for anxiety earlier. No complaints noted. Pt took evening meds as prescribed.

## 2021-03-18 NOTE — Progress Notes (Signed)
Patient had to be escorted back onto the unit from the cafeteria due to making gang signs and general homicidal threats to no one person particular. Pt administered po ativan and haldol, and is currently sitting in the hallway cubby eating her dinner.

## 2021-03-18 NOTE — Progress Notes (Signed)
   03/17/21 2048  Psych Admission Type (Psych Patients Only)  Admission Status Involuntary  Psychosocial Assessment  Patient Complaints Anxiety;Isolation;Suspiciousness  Eye Contact Watchful;Suspiciousness  Facial Expression Anxious  Affect Anxious;Preoccupied  Speech Logical/coherent  Interaction Forwards little;Guarded;Cautious  Motor Activity Slow  Appearance/Hygiene Unremarkable  Behavior Characteristics Cooperative;Anxious;Guarded  Mood Anxious;Suspicious;Preoccupied  Thought Process  Coherency Disorganized;Tangential  Content Delusions;Paranoia  Delusions Grandeur;Paranoid  Perception Derealization  Hallucination None reported or observed  Judgment Poor  Confusion Moderate  Danger to Self  Current suicidal ideation? Denies  Danger to Others  Danger to Others None reported or observed

## 2021-03-18 NOTE — Tx Team (Signed)
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[place])   Substance-induced psychotic disorder ([place])   Current Medications:  Current Facility-Administered Medications  Medication Dose Route Frequency Provider Last Rate Last Admin   acetaminophen (TYLENOL) tablet 650 mg  650 mg Oral Q6H PRN Chalmers Guest, NP       albuterol (VENTOLIN HFA) 108 (90 Base) MCG/ACT inhaler 2 puff  2 puff Inhalation Q6H PRN Chalmers Guest, NP       alum & mag hydroxide-simeth (MAALOX/MYLANTA) 200-200-20 MG/5ML suspension 30 mL  30 mL Oral Q4H PRN Chalmers Guest, NP       diphenhydrAMINE (BENADRYL) injection 50 mg  50 mg Intramuscular Q6H PRN Arthor Captain, MD       haloperidol (HALDOL) tablet 5 mg  5 mg Oral Q8H PRN Arthor Captain, MD   5 mg at 03/17/21 1524   Or   haloperidol lactate (HALDOL) injection 5 mg  5 mg Intramuscular Q8H PRN Arthor Captain, MD       OLANZapine zydis (ZYPREXA) disintegrating tablet 15 mg  15 mg Oral QHS Arthor Captain, MD   15 mg at 03/17/21 2048   Or   haloperidol lactate (HALDOL) injection 5 mg  5 mg Intramuscular QHS Arthor Captain, MD       OLANZapine zydis (ZYPREXA) disintegrating tablet 5 mg  5 mg Oral Daily Arthor Captain, MD   5 mg at 03/18/21 2355   Or   haloperidol lactate (HALDOL) injection 5 mg  5 mg Intramuscular Daily Arthor Captain, MD       hydrOXYzine (ATARAX/VISTARIL) tablet 25 mg  25 mg Oral TID PRN Chalmers Guest, NP       lithium carbonate (ESKALITH) CR tablet 450 mg  450 mg Oral Q12H Arthor Captain, MD   450 mg at 03/18/21 0949   LORazepam (ATIVAN) tablet 1 mg   1 mg Oral Q8H PRN Arthor Captain, MD   1 mg at 03/17/21 1455   Or   LORazepam (ATIVAN) injection 1 mg  1 mg Intramuscular Q8H PRN Arthor Captain, MD       magnesium hydroxide (MILK OF MAGNESIA) suspension 30 mL  30 mL Oral Daily PRN Chalmers Guest, NP       traZODone (DESYREL) tablet 50 mg  50 mg Oral QHS PRN Chalmers Guest, NP       PTA Medications: Medications Prior to Admission  Medication Sig Dispense Refill Last Dose   albuterol (VENTOLIN HFA) 108 (90 Base) MCG/ACT inhaler Inhale 2 puffs into the lungs every 6 (six) hours as needed for wheezing or shortness of breath. (Patient not taking: Reported on 03/13/2021) 1 each 0 Not Taking   hydrOXYzine (ATARAX/VISTARIL) 25 MG tablet Take 1 tablet (25 mg total) by mouth every 6 (six) hours as needed for anxiety. (Patient not taking: Reported on 03/13/2021) 75 tablet 0 Not Taking   lithium carbonate (LITHOBID) 300 MG CR tablet Take 1 tablet (300 mg total) by mouth every 12 (twelve) hours. For mood stabilization (Patient not taking: Reported on 03/13/2021) 60 tablet 0 Not Taking   OLANZapine (ZYPREXA)  15 MG tablet Take 1 tablet (15 mg total) by mouth at bedtime. For mood control (Patient not taking: Reported on 03/13/2021) 30 tablet 0 Not Taking   traZODone (DESYREL) 50 MG tablet Take 1 tablet (50 mg total) by mouth at bedtime as needed for sleep. (Patient not taking: Reported on 03/13/2021) 30 tablet 0 Not Taking    Patient Stressors: Health problems Medication change or noncompliance  Patient Strengths: Average or above average intelligence Motivation for treatment/growth Physical Health Supportive family/friends  Treatment Modalities: Medication Management, Group therapy, Case management,  1 to 1 session with clinician, Psychoeducation, Recreational therapy.   Physician Treatment Plan for Primary Diagnosis: Bipolar I disorder, current or most recent episode manic, with psychotic features ([place]) Long Term Goal(s): Improvement in symptoms so as  ready for discharge   Short Term Goals: Ability to identify changes in lifestyle to reduce recurrence of condition will improve Ability to verbalize feelings will improve Ability to disclose and discuss suicidal ideas Ability to demonstrate self-control will improve Ability to identify and develop effective coping behaviors will improve Ability to maintain clinical measurements within normal limits will improve Compliance with prescribed medications will improve Ability to identify triggers associated with substance abuse/mental health issues will improve  Medication Management: Evaluate patient's response, side effects, and tolerance of medication regimen.  Therapeutic Interventions: 1 to 1 sessions, Unit Group sessions and Medication administration.  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[place])   Substance-induced psychotic disorder ([place])  Long Term Goal(s): Improvement in symptoms so as ready for discharge   Short Term Goals: Ability to identify changes in lifestyle to reduce recurrence of condition will improve Ability to verbalize feelings will improve Ability to disclose and discuss suicidal ideas Ability to demonstrate self-control will improve Ability to identify and develop effective coping behaviors will improve Ability to maintain clinical measurements within normal limits will improve Compliance with prescribed medications will improve Ability to identify triggers associated with substance abuse/mental health issues will improve     Medication Management: Evaluate patient's response, side effects, and tolerance of medication regimen.  Therapeutic Interventions: 1 to 1 sessions, Unit Group sessions and Medication administration.  Evaluation  of Outcomes: Not Met   RN Treatment Plan for Primary Diagnosis: Bipolar I disorder, current or most recent episode manic, with psychotic features ([place]) Long Term Goal(s): Knowledge of disease and therapeutic regimen to maintain health will improve  Short Term Goals: Ability to remain free from injury will improve, Ability to demonstrate self-control, Ability to participate in decision making will improve, Ability to verbalize feelings will improve, Ability to disclose and discuss suicidal ideas, and Ability to identify and develop effective coping behaviors will improve  Medication Management: RN will administer medications as ordered by provider, will assess and evaluate patient's response and provide education to patient for prescribed medication. RN will report any adverse and/or side effects to prescribing provider.  Therapeutic Interventions: 1 on 1 counseling sessions, Psychoeducation, Medication administration, Evaluate responses to treatment, Monitor vital signs and CBGs as ordered, Perform/monitor CIWA, COWS, AIMS and Fall Risk screenings as ordered, Perform wound care treatments as ordered.  Evaluation of Outcomes: Not Met   LCSW Treatment Plan for Primary Diagnosis: Bipolar I disorder, current or most recent episode manic, with psychotic features ([place]) Long Term Goal(s): Safe transition  to appropriate next level of care at discharge, Engage patient in therapeutic group addressing interpersonal concerns.  Short Term Goals: Engage patient in aftercare planning with referrals and resources, Increase social support, Increase ability to appropriately verbalize feelings, Increase emotional regulation, Facilitate acceptance of mental health diagnosis and concerns, Facilitate patient progression through stages of change regarding substance use diagnoses and concerns, Identify triggers associated with mental health/substance abuse issues, and Increase skills for wellness and  recovery  Therapeutic Interventions: Assess for all discharge needs, 1 to 1 time with Social worker, Explore available resources and support systems, Assess for adequacy in community support network, Educate family and significant other(s) on suicide prevention, Complete Psychosocial Assessment, Interpersonal group therapy.  Evaluation of Outcomes: Not Met   Progress in Treatment: Attending groups: No. Participating in groups: No. Taking medication as prescribed: No. Toleration medication: Yes. Family/Significant other contact made: Yes, individual(s) contacted:  If consents are provided  Patient understands diagnosis: No. Discussing patient identified problems/goals with staff: Yes. Medical problems stabilized or resolved: Yes. Denies suicidal/homicidal ideation: Yes. Issues/concerns per patient self-inventory: No.   New problem(s) identified: No, Describe:  None   New Short Term/Long Term Goal(s): medication stabilization, elimination of SI thoughts, development of comprehensive mental wellness plan.   Patient Goals: Did not attend   Discharge Plan or Barriers: Patient recently admitted. CSW will continue to follow and assess for appropriate referrals and possible discharge planning.   Reason for Continuation of Hospitalization: Aggression Anxiety Delusions  Medication stabilization  Estimated Length of Stay: 3 to 5 days   Attendees: Patient: Did not attend  03/18/2021   Physician: Lestine Mount, DO 03/18/2021   Nursing:  03/18/2021   RN Care Manager: 03/18/2021   Social Worker: Verdis Frederickson, [place] 03/18/2021   Recreational Therapist:  03/18/2021   Other:  03/18/2021   Other:  03/18/2021   Other: 03/18/2021     Scribe for Treatment Team: Mliss Fritz, [place] 03/18/2021 10:11 AM

## 2021-03-18 NOTE — [facility] Counselor (Signed)
CSW spoke with pt's fiance Marion Downer [phone] in regards to this pt's treatment and discharge plans.    Ruthann Cancer MSW, LCSW Clincal Social Worker  [facility]

## 2021-03-18 NOTE — Progress Notes (Signed)
Recreation Therapy Notes  Date: 7.25.22 Time: 1010 Location: [address] Dayroom  Group Topic: Coping Skills  Goal Area(s) Addresses:  Patient will identify positive coping skills. Patient will identify benefit of using positive coping skills post d/c.  Behavioral Response: Engaged  Intervention: [employer], Pencils, Airline pilot, Marker  Activity: Mind Map.  LRT and patients filled the first 8 boxes of the mind map (anger, frustration, depression, self esteem, patience, love/caring, tolerance and finances) with items that would need coping skills to be dealt with appropriately. Individually, patients were then given 20 minutes to come up with at least three coping skills for each area identified. The group would then come back together and write the answers on the board.  If anyone had any blank spots on their sheet, they could fill them in with the coping skills that had been identified by peers.  Education: Pharmacologist, Building control surveyor.   Education Outcome: Acknowledges understanding/In group clarification offered/Needs additional education.   Clinical Observations/Feedback: Pt was attentive and appropriate during group session.  Pt came up with coping skills for anger like- talk to someone; depression- sit by yourself, take a walk, read a book try to laugh, talk on the phone and pray to God; self-esteem- be around positive people; patience- evaluate your partner; love/caring love one another and tolerance- walk away.      Caroll Rancher, LRT/CTRS        Caroll Rancher A 03/18/2021 12:40 PM
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
## 2021-03-19 LAB — PREGNANCY, URINE: Preg Test, Ur: NEGATIVE

## 2021-03-19 NOTE — Progress Notes (Signed)
Recreation Therapy Notes  Date: 7.26.22 Time: 1000 Location: 500 Hall Dayroom  Group Topic: Self Expression  Goal Area(s) Addresses:  Patient will successfully identify lessons learned throughout the years.  Patient will successfully identify how those lessons have shaped their lives.  Behavioral Response: Engaged  Intervention: Pencils, Colored Pencils, Wellsite geologist  Activity: Letter To My Younger Self.  Patients were given a blank mirror handout.  Patients were asked to think about the things they have learned over the years.  Patients were then asked to write a letter to their younger selves giving wisdom about what they have learned to help their younger self avoid some of the pitfalls they have faced.  Education:  Self-Esteem, Discharge Planning  Education Outcome: Acknowledges education/In group clarification offered/Needs additional education  Clinical Observations/Feedback: Pt started off group appearing a little distant but became more focused as group progressed.  Pt became more active and interactive during group.  Pt expressed one of the many lessons learned was to "let others speak before you".  Pt wrote in the letter to her younger self "stay strong and be strong" which meant "to always pray before leaving out the door for more encouragement".  Stop being hard headed because "it gets you nowhere but jail".  Stay focused, listen to elders and always stay ahead of yourself.  Pt was brighter and more vocal by the time group ended.    Caroll Rancher, LRT/CTRS      Caroll Rancher A 03/19/2021 12:32 PM
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
## 2021-03-19 NOTE — Progress Notes (Signed)
DAR Note: Patient calm and cooperative, forwards little information, seems preoccupied and guarded, denies SI/HI/AVH, and took all of her morning meds as scheduled. Pt being maintained on Q15 minute checks for safety.   03/19/21 1033  Psych Admission Type (Psych Patients Only)  Admission Status Involuntary  Psychosocial Assessment  Patient Complaints None  Eye Contact Brief  Facial Expression Flat  Affect Preoccupied  Speech Logical/coherent  Interaction Forwards little;Guarded;Cautious  Motor Activity Slow  Appearance/Hygiene Unremarkable  Behavior Characteristics Cooperative  Mood Depressed  Thought Process  Coherency WDL  Content Paranoia  Delusions None reported or observed  Perception WDL  Hallucination None reported or observed  Judgment Poor  Confusion UTA  Danger to Self  Current suicidal ideation? Denies  Danger to Others  Danger to Others None reported or observed

## 2021-03-19 NOTE — Progress Notes (Signed)
Adult Psychoeducational Group Note  Date:  03/19/2021 Time:  10:57 PM  Group Topic/Focus:  Wrap-Up Group:   The focus of this group is to help patients review their daily goal of treatment and discuss progress on daily workbooks.  Participation Level:  Active  Participation Quality:  Appropriate  Affect:  Appropriate  Cognitive:  Appropriate  Insight: Appropriate  Engagement in Group:  Developing/Improving  Modes of Intervention:  Discussion  Additional Comments:  Pt stated her goal for today was to focus on her treatment plan. Pt stated she accomplished her goal today. Pt stated she talked with her doctor and her social worker about her care today. Pt rated her overall day a 10. Pt stated she was able to contact her Grandmother today which improved her overall day. Pt stated she felt better about herself tonight. Pt stated she was able to attend all groups held today. Pt stated she was able to attend all meals. Pt stated she took all medications provided today. Pt stated her appetite was pretty good today. Pt rated her sleep last night was pretty good. Pt stated the goal tonight was to get some rest. Pt stated she had some physical pain tonight. Pt stated she had some moderate pain in her back tonight. Pt rated the moderate pain in her back a 5 on the pain level scale. Pt nurse was updated on situation. Pt deny visual hallucinations and auditory issues tonight.  Pt denies thoughts of harming herself or others. Pt stated she would alert staff if anything changed  Felipa Furnace 03/19/2021, 10:57 PM

## 2021-03-19 NOTE — [facility] Group Notes (Signed)
[facility] Group Notes:  (Nursing/MHT/Case Management/Adjunct)  Date:  03/19/2021  Time:  10:27 AM  Type of Therapy:  Group Therapy  Participation Level:  Active  Participation Quality:  Appropriate  Affect:  Appropriate  Cognitive:  Alert and Appropriate  Insight:  Appropriate  Engagement in Group:  Engaged  Modes of Intervention:  Orientation  Summary of Progress/Problems:Pt goal for today is to get released so that she can go home and go pick up her medicine from the pharmacy.   Calven Gilkes J Racquel Arkin 03/19/2021, 10:27 AM

## 2021-03-20 MED ORDER — HALOPERIDOL 5 MG PO TABS
10.0000 mg | ORAL_TABLET | Freq: Every evening | ORAL | Status: DC
  Administered 2021-03-21: 10 mg via ORAL
  Filled 2021-03-20 (×3): qty 2

## 2021-03-20 MED ORDER — BENZTROPINE MESYLATE 0.5 MG PO TABS
0.5000 mg | ORAL_TABLET | Freq: Two times a day (BID) | ORAL | Status: DC
  Administered 2021-03-20 – 2021-03-22 (×4): 0.5 mg via ORAL
  Filled 2021-03-20 (×2): qty 1
  Filled 2021-03-20: qty 14
  Filled 2021-03-20 (×3): qty 1
  Filled 2021-03-20: qty 14
  Filled 2021-03-20 (×3): qty 1

## 2021-03-20 MED ORDER — HALOPERIDOL 5 MG PO TABS
10.0000 mg | ORAL_TABLET | Freq: Once | ORAL | Status: AC
  Filled 2021-03-20: qty 2

## 2021-03-20 MED ORDER — HALOPERIDOL 5 MG PO TABS
5.0000 mg | ORAL_TABLET | Freq: Every day | ORAL | Status: DC
  Filled 2021-03-20 (×2): qty 1
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
## 2021-03-20 NOTE — Progress Notes (Signed)
Recreation Therapy Notes  Date: 7.27.22 Time: 1005 Location: [address] Day Room   Group Topic: Leisure Education    Goal Area(s) Addresses:  Patient will successfully identify benefits of leisure participation. Patient will successfully identify ways to access leisure activities. Patient will identify leisure activities available based on their geographical location in proximity to their home. Patient will follow directions on first prompt.    Behavioral Response: Engaged  Intervention: Scientist, clinical (histocompatibility and immunogenetics), Markers, Scissors, Tax adviser, Magazines   Activity: Patients were to select an activity provided by LRT.  Patients were to then create a PSA poster describing the activity.  Patients also explained the benefits, equipment needed, where it can be done and who can do the activity.  Patients were to be as creative as needed and presented PSAs to group when completed.    Education:  Leisure Education, Building control surveyor   Education Outcome: Acknowledges education   Clinical Observations/Feedback: Pt didn't fully understand the instructions because pt created a poster about herself.  Pt had the activity of journaling.  Pt explained to journal all that was needed was "myself, picture myself cruising and going to [place]".  When prompted, pt stated journaling can be done anywhere at anytime.  Pt identified the benefits of journaling as "keeps the mind focused, steady and you stay two steps ahead".     Caroll Rancher, LRT/CTRS         Caroll Rancher A 03/20/2021 12:58 PM

## 2021-03-20 NOTE — BHH Group Notes (Signed)
[facility] LCSW Group Therapy Note  Date/Time: 03/20/2021  Type of Therapy and Topic:  Group Therapy:  Strengths and Qualities  Participation Level:  Active  Description of Group:    In this group patients will be asked to explore and define the terms strength ans qualities.  Patients will be guided to discuss their thoughts, feelings, and behaviors as to where strengths and qualities originate. Participants will then list some of their strengths and qualities related to each subject topic. This group will be process-oriented, with patients participating in exploration of their own experiences as well as giving and receiving support and challenge from other group members.  Therapeutic Goals: Patient will identify specific strengths related to their personal life. Patient will identify feelings, thoughts, and beliefs about strengths and qualities. Patient will identify ways their strengths have been used. . Patient will identify situations where they have helped others or made someone else happy. .  Summary of Patient Progress Patient participate and engaged appropriately in group. Patient identified specific strengths that they had including having wisdom and having discipline/boudaries.  Patient explored different strengths utilized in relationships, professional life and Arboriculturist.  Patient engaged in discussion and was there to support others in affirming and recognizing there strengths.      Therapeutic Modalities:   Cognitive Behavioral Therapy Solution Focused Therapy Motivational Interviewing Brief Therapy    Sandon Yoho, LCSW, LCAS Clincal Social Worker  [facility]

## 2021-03-20 NOTE — Progress Notes (Signed)
Pt visible in milieu majority of this shift. Attended scheduled groups and activities off unit. Observed with brief but fair eye contact, concrete speech, blunted affect but does brightens up when engaged and is pleasant on interactions. Pt reports she slept well last night and her appetite is good. Denies SI, HI, AVH and pain when assessed earlier this shift "not right now". Tolerates all PO medications, meals and fluids well when offered.  Support and encouragement offered to pt this shift. Q 15 minutes safety checks maintained without issues. All medications given as ordered with verbal education.  Pt remains safe on and off unit. Denies concerns at this time. Ambulatory in milieu with a steady gait. Appears to be in no distress / discomfort.

## 2021-03-20 NOTE — Progress Notes (Signed)
   03/19/21 2030  Psych Admission Type (Psych Patients Only)  Admission Status Involuntary  Psychosocial Assessment  Patient Complaints Anxiety  Eye Contact Brief  Facial Expression Other (Comment) (appropriate)  Affect Appropriate to circumstance  Speech Logical/coherent  Interaction Forwards little;Guarded;Cautious  Motor Activity Slow  Appearance/Hygiene Unremarkable  Behavior Characteristics Cooperative  Mood Anxious  Thought Process  Coherency WDL  Content WDL  Delusions None reported or observed  Perception WDL  Hallucination None reported or observed  Judgment Poor  Confusion None  Danger to Self  Current suicidal ideation? Denies  Danger to Others  Danger to Others None reported or observed   Pt seen in her room. Pt denies SI, HI, AVH. Pt endorses acute back pain 5/10. Rates anxiety 10/10. Says she had a good day. Takes meds as prescribed.

## 2021-03-20 NOTE — [facility] Group Notes (Signed)
The focus of this group is to help patients establish daily goals to achieve during treatment and discuss how the patient can incorporate goal setting into their daily lives to aide in recovery.  Pt did not attend group 

## 2021-03-20 NOTE — BHH Group Notes (Signed)
Adult Psychoeducational Group Note  Date:  03/20/2021 Time:  4:56 PM  Group Topic/Focus:  Overcoming Stress:   The focus of this group is to define stress and help patients assess their triggers.  Participation Level:  Minimal  Participation Quality:  Attentive  Affect:  Appropriate  Cognitive:  Appropriate  Insight: Good  Engagement in Group:  Developing/Improving  Modes of Intervention:  Discussion  Additional Comments:    Donell Beers 03/20/2021, 4:56 PM

## 2021-03-21 LAB — PREGNANCY, URINE: Preg Test, Ur: NEGATIVE

## 2021-03-21 MED ORDER — HALOPERIDOL 5 MG PO TABS
10.0000 mg | ORAL_TABLET | Freq: Every day | ORAL | Status: DC
  Administered 2021-03-21 – 2021-03-22 (×2): 10 mg via ORAL
  Filled 2021-03-21 (×2): qty 2
  Filled 2021-03-21: qty 28
  Filled 2021-03-21: qty 2

## 2021-03-21 MED ORDER — HALOPERIDOL DECANOATE 100 MG/ML IM SOLN
50.0000 mg | Freq: Once | INTRAMUSCULAR | Status: AC
  Administered 2021-03-21: 50 mg via INTRAMUSCULAR
  Filled 2021-03-21: qty 0.5

## 2021-03-21 MED ORDER — LORATADINE 10 MG PO TABS
5.0000 mg | ORAL_TABLET | Freq: Every day | ORAL | Status: DC
  Administered 2021-03-21 – 2021-03-22 (×2): 5 mg via ORAL
  Filled 2021-03-21 (×2): qty 0.5
  Filled 2021-03-21: qty 1
  Filled 2021-03-21: qty 0.5

## 2021-03-21 NOTE — Progress Notes (Signed)
Recreation Therapy Notes  Date: 7.28.22 Time: 1000 Location: [address] Dayroom  Group Topic: Communication  Goal Area(s) Addresses:  Patient will effectively communicate with peers in group.  Patient will verbalize benefit of healthy communication. Patient will verbalize positive effect of healthy communication on post d/c goals.   Behavioral Response:  Appropriate  Intervention: Geometrical Drawings, Blank Paper, Pencils   Activity: What You Say.  Three patients would get to be speakers.  They were to describe Hooper picture to the group in as much detail as possible.  The remaining patients were to draw the picture how it is described to them.  The patients that are the listeners, could only as the speakers to repeat themselves.  They could not ask any detailed questions.  After each turn, the speaker will show the group the original picture and the group will compare their pictures to the original.  Education: Communication, Discharge Planning  Education Outcome: Acknowledges understanding/In group clarification offered/Needs additional education.   Clinical Observations/Feedback:  Pt attentive and appropriate.  Pt described one of the pictures to the group.  Pt had to be redirected to slow down while giving instructions.  Pt and peers felt she did [place] good job with describing the picture.  Pt was engaged throughout group session.     Gina Hooper, LRT/CTRS        Gina Hooper, Gina Hooper 03/21/2021 11:18 AM
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
## 2021-03-21 NOTE — Progress Notes (Signed)
   03/21/21 2100  Psych Admission Type (Psych Patients Only)  Admission Status Involuntary  Psychosocial Assessment  Patient Complaints None  Eye Contact Fair  Facial Expression Other (Comment) (appropriate)  Affect Appropriate to circumstance  Speech Logical/coherent  Interaction Forwards little;Guarded;Cautious  Motor Activity Other (Comment) ([place])  Appearance/Hygiene Unremarkable  Behavior Characteristics Cooperative  Mood Pleasant  Thought Process  Coherency Concrete thinking  Content WDL  Delusions None reported or observed  Perception WDL  Hallucination None reported or observed  Judgment Limited  Confusion None  Danger to Self  Current suicidal ideation? Denies  Danger to Others  Danger to Others None reported or observed   Pt seen in dayroom. Pt denies SI, HI, AVH and pain. Says she is ready to leave tomorrow. Says she will return to her boyfriend and he will pick her up tomorrow. Takes meds as prescribed.

## 2021-03-21 NOTE — [facility] Group Notes (Signed)
This pt. came to group was calm and cooperative and participate in the "coping strategies" conversation and filled out the "coping strategies" form.  

## 2021-03-22 LAB — BASIC METABOLIC PANEL
Anion gap: 8 (ref 5–15)
BUN: 10 mg/dL (ref 6–20)
CO2: 24 mmol/L (ref 22–32)
Calcium: 9.2 mg/dL (ref 8.9–10.3)
Chloride: 105 mmol/L (ref 98–111)
Creatinine, Ser: 0.76 mg/dL (ref 0.44–1.00)
GFR, Estimated: >60 mL/min (ref >60)
Glucose, Bld: 85 mg/dL (ref 70–99)
Potassium: 3.7 mmol/L (ref 3.5–5.1)
Sodium: 137 mmol/L (ref 135–145)

## 2021-03-22 LAB — LITHIUM LEVEL: Lithium Lvl: 0.69 mmol/L (ref 0.60–1.20)

## 2021-03-22 MED ORDER — OLANZAPINE 15 MG PO TABS: 15.0000 mg | ORAL_TABLET | Freq: Every day | ORAL | 0 refills | Status: DC

## 2021-03-22 MED ORDER — HALOPERIDOL DECANOATE 100 MG/ML IM SOLN: 50.0000 mg | INTRAMUSCULAR | Status: DC

## 2021-03-22 MED ORDER — HALOPERIDOL DECANOATE 100 MG/ML IM SOLN: 50.0000 mg | INTRAMUSCULAR | 0 refills | Status: DC

## 2021-03-22 MED ORDER — HALOPERIDOL 10 MG PO TABS: ORAL_TABLET | ORAL | 0 refills | Status: DC

## 2021-03-22 MED ORDER — LITHIUM CARBONATE ER 450 MG PO TBCR: 450.0000 mg | EXTENDED_RELEASE_TABLET | Freq: Two times a day (BID) | ORAL | 0 refills | Status: DC

## 2021-03-22 MED ORDER — TRAZODONE HCL 50 MG PO TABS: 50.0000 mg | ORAL_TABLET | Freq: Every evening | ORAL | 0 refills | Status: DC | PRN

## 2021-03-22 MED ORDER — BENZTROPINE MESYLATE 0.5 MG PO TABS: 0.5000 mg | ORAL_TABLET | Freq: Two times a day (BID) | ORAL | 0 refills | Status: DC

## 2021-03-22 MED ORDER — CETIRIZINE HCL 10 MG PO TABS: 10.0000 mg | ORAL_TABLET | Freq: Every day | ORAL | 0 refills | Status: DC

## 2021-03-22 MED ORDER — OLANZAPINE 7.5 MG PO TABS
15.0000 mg | ORAL_TABLET | Freq: Every day | ORAL | Status: DC
  Filled 2021-03-22: qty 14
  Filled 2021-03-22: qty 2

## 2021-03-22 MED ORDER — HYDROXYZINE HCL 25 MG PO TABS: 25.0000 mg | ORAL_TABLET | Freq: Three times a day (TID) | ORAL | 0 refills | Status: DC | PRN

## 2021-03-22 NOTE — [facility] Group Notes (Signed)
[facility] LCSW Group Therapy  03/22/2021 1:20 PM  Type of Therapy:  Group Therapy: Impulse Control  Participation Level:  Active  Participation Quality:  Appropriate  Affect:  Appropriate  Cognitive:  Appropriate  Insight:  Developing/Improving  Engagement in Therapy:  Developing/Improving  Modes of Intervention:  Discussion and Education  Summary of Progress/Problems: Patient participated and engaged in group. Patient discussed different ways she is able to handle stressors in life and to avoid impulsive decision making. Patient provided insight and lived experience about how to handle triggers in life and avoid triggers that lead to impulsive behaviors.   Jashon Ishida E Kyan Yurkovich 03/22/2021, 1:20 PM

## 2021-03-22 NOTE — Plan of Care (Signed)
Patient attended all recreation therapy group sessions in a calm and appropriate mood at completion of recreation therapy group sessions.   Caroll Rancher, LRT/CTRS
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
## 2021-03-22 NOTE — Progress Notes (Signed)
Recreation Therapy Notes  INPATIENT RECREATION TR PLAN  Patient Details Name: RYLIEGH MCDUFFEY MRN: [id number] DOB: [birth year] Today's Date: 03/22/2021  Rec Therapy Plan Is patient appropriate for Therapeutic Recreation?: Yes Treatment times per week: about 3 days Estimated Length of Stay: 5-7 days TR Treatment/Interventions: Group participation (Comment)  Discharge Criteria Pt will be discharged from therapy if:: Discharged Treatment plan/goals/alternatives discussed and agreed upon by:: Patient/family  Discharge Summary Short term goals set: See patient care plan Short term goals met: Complete Progress toward goals comments: Groups attended Which groups?: Coping skills, Leisure education, Communication, Other (Comment) (Team Building; Self Expression; Decision Making) Reason goals not met: None Therapeutic equipment acquired: N/A Reason patient discharged from therapy: Discharge from hospital Pt/family agrees with progress & goals achieved: Yes Date patient discharged from therapy: 03/22/21    Victorino Sparrow, LRT/CTRS   Ria Comment, [place] 03/22/2021, 1:01 PM

## 2021-03-22 NOTE — Progress Notes (Signed)
  [facility] Adult Case Management Discharge Plan :  Will you be returning to the same living situation after discharge:  Yes,  Home  At discharge, do you have transportation home?: Yes,  Fiance  Do you have the ability to pay for your medications: Yes,  Medicaid   Release of information consent forms completed and in the chart;  Patient's signature needed at discharge.  Patient to Follow up at:  Follow-up Information     [facility] Follow up on 03/28/2021.   Specialty: Behavioral Health Why: Appointment for Medications Management is scheduled for 03/28/2021 at 8:00 am.  This appointment will be held in person.  For therapy services, please go during walk in hours:  Monday through Wednesday from 8:00 am to 11:00 am. Contact information: [address] [place] [address] [phone]        ADS. Go to.   Why: Please go to this provider for substance abuse intensive outpatient therapy services during the walk in hours:  Monday through Friday, from 6:00 am to 10:30 am. Contact information: Contact information: [address] [place], [place] [address]   P: [phone] F: [phone]                Next level of care provider has access to [facility] Link:yes  Safety Planning and Suicide Prevention discussed: Yes,  with patient and fiance   Have you used any form of tobacco in the last 30 days? (Cigarettes, Smokeless Tobacco, Cigars, and/or Pipes): Yes  Has patient been referred to the Quitline?: Patient refused referral  Patient has been referred for addiction treatment: Yes  Aram Beecham, LCSWA 03/22/2021, 9:55 AM

## 2021-03-22 NOTE — Tx Team (Signed)
Interdisciplinary Treatment and Diagnostic Plan Update  03/22/2021 Time of Session: 9:25am  Gina Hooper MRN: [id number]  Principal Diagnosis: Bipolar I disorder, current or most recent episode manic, with psychotic features (HCC)  Secondary Diagnoses: Principal Problem:   Bipolar I disorder, current or most recent episode manic, with psychotic features (HCC) Active Problems:   Polysubstance abuse (HCC)   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HCC)   Substance-induced psychotic disorder (HCC)   Current Medications:  Current Facility-Administered Medications  Medication Dose Route Frequency Provider Last Rate Last Admin   acetaminophen (TYLENOL) tablet 650 mg  650 mg Oral Q6H PRN Novella Olive, NP   650 mg at 03/19/21 2115   albuterol (VENTOLIN HFA) 108 (90 Base) MCG/ACT inhaler 2 puff  2 puff Inhalation Q6H PRN Novella Olive, NP   2 puff at 03/22/21 0751   alum & mag hydroxide-simeth (MAALOX/MYLANTA) 200-200-20 MG/5ML suspension 30 mL  30 mL Oral Q4H PRN Novella Olive, NP       benztropine (COGENTIN) tablet 0.5 mg  0.5 mg Oral Q6H PRN Claudie Revering, MD       benztropine (COGENTIN) tablet 0.5 mg  0.5 mg Oral BID Claudie Revering, MD   0.5 mg at 03/22/21 0750   diphenhydrAMINE (BENADRYL) injection 50 mg  50 mg Intramuscular Q6H PRN Claudie Revering, MD       haloperidol (HALDOL) tablet 10 mg  10 mg Oral QPM Claudie Revering, MD   10 mg at 03/21/21 1654   haloperidol (HALDOL) tablet 10 mg  10 mg Oral Q1200 Claudie Revering, MD   10 mg at 03/22/21 0749   haloperidol (HALDOL) tablet 5 mg  5 mg Oral Q8H PRN Claudie Revering, MD   5 mg at 03/18/21 1751   Or   haloperidol lactate (HALDOL) injection 5 mg  5 mg Intramuscular Q8H PRN Claudie Revering, MD       hydrOXYzine (ATARAX/VISTARIL) tablet 25 mg  25 mg Oral TID PRN Novella Olive, NP       lithium carbonate (ESKALITH) CR tablet 450 mg  450 mg Oral Q12H Claudie Revering, MD   450 mg at 03/22/21 0749   loratadine (CLARITIN) tablet 5 mg  5 mg  Oral Daily Claudie Revering, MD   5 mg at 03/22/21 0750   LORazepam (ATIVAN) tablet 1 mg  1 mg Oral Q8H PRN Claudie Revering, MD   1 mg at 03/18/21 1751   Or   LORazepam (ATIVAN) injection 1 mg  1 mg Intramuscular Q8H PRN Claudie Revering, MD       magnesium hydroxide (MILK OF MAGNESIA) suspension 30 mL  30 mL Oral Daily PRN Novella Olive, NP       OLANZapine zydis (ZYPREXA) disintegrating tablet 15 mg  15 mg Oral QHS Claudie Revering, MD   15 mg at 03/21/21 2051   traZODone (DESYREL) tablet 50 mg  50 mg Oral QHS PRN,MR X 1 Claudie Revering, MD   50 mg at 03/21/21 2050   PTA Medications: Medications Prior to Admission  Medication Sig Dispense Refill Last Dose   albuterol (VENTOLIN HFA) 108 (90 Base) MCG/ACT inhaler Inhale 2 puffs into the lungs every 6 (six) hours as needed for wheezing or shortness of breath. (Patient not taking: Reported on 03/13/2021) 1 each 0 Not Taking   hydrOXYzine (ATARAX/VISTARIL) 25 MG tablet Take 1 tablet (25 mg total) by mouth every 6 (six) hours as needed for  anxiety. (Patient not taking: Reported on 03/13/2021) 75 tablet 0 Not Taking   lithium carbonate (LITHOBID) 300 MG CR tablet Take 1 tablet (300 mg total) by mouth every 12 (twelve) hours. For mood stabilization (Patient not taking: Reported on 03/13/2021) 60 tablet 0 Not Taking   OLANZapine (ZYPREXA) 15 MG tablet Take 1 tablet (15 mg total) by mouth at bedtime. For mood control (Patient not taking: Reported on 03/13/2021) 30 tablet 0 Not Taking   traZODone (DESYREL) 50 MG tablet Take 1 tablet (50 mg total) by mouth at bedtime as needed for sleep. (Patient not taking: Reported on 03/13/2021) 30 tablet 0 Not Taking    Patient Stressors: Health problems Medication change or noncompliance  Patient Strengths: Average or above average intelligence Motivation for treatment/growth Physical Health Supportive family/friends  Treatment Modalities: Medication Management, Group therapy, Case management,  1 to 1 session with  clinician, Psychoeducation, Recreational therapy.   Physician Treatment Plan for Primary Diagnosis: Bipolar I disorder, current or most recent episode manic, with psychotic features (HCC) Long Term Goal(s): Improvement in symptoms so as ready for discharge   Short Term Goals: Ability to identify changes in lifestyle to reduce recurrence of condition will improve Ability to verbalize feelings will improve Ability to disclose and discuss suicidal ideas Ability to demonstrate self-control will improve Ability to identify and develop effective coping behaviors will improve Ability to maintain clinical measurements within normal limits will improve Compliance with prescribed medications will improve Ability to identify triggers associated with substance abuse/mental health issues will improve  Medication Management: Evaluate patient's response, side effects, and tolerance of medication regimen.  Therapeutic Interventions: 1 to 1 sessions, Unit Group sessions and Medication administration.  Evaluation of Outcomes: Adequate for Discharge  Physician Treatment Plan for Secondary Diagnosis: Principal Problem:   Bipolar I disorder, current or most recent episode manic, with psychotic features (HCC) Active Problems:   Polysubstance abuse (HCC)   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HCC)   Substance-induced psychotic disorder (HCC)  Long Term Goal(s): Improvement in symptoms so as ready for discharge   Short Term Goals: Ability to identify changes in lifestyle to reduce recurrence of condition will improve Ability to verbalize feelings will improve Ability to disclose and discuss suicidal ideas Ability to demonstrate self-control will improve Ability to identify and develop effective coping behaviors will improve Ability to maintain clinical measurements within normal limits will improve Compliance with prescribed medications will improve Ability to identify triggers associated with substance  abuse/mental health issues will improve     Medication Management: Evaluate patient's response, side effects, and tolerance of medication regimen.  Therapeutic Interventions: 1 to 1 sessions, Unit Group sessions and Medication administration.  Evaluation of Outcomes: Adequate for Discharge   RN Treatment Plan for Primary Diagnosis: Bipolar I disorder, current or most recent episode manic, with psychotic features (HCC) Long Term Goal(s): Knowledge of disease and therapeutic regimen to maintain health will improve  Short Term Goals: Ability to remain free from injury will improve, Ability to demonstrate self-control, Ability to participate in decision making will improve, Ability to verbalize feelings will improve, Ability to disclose and discuss suicidal ideas, and Ability to identify and develop effective coping behaviors will improve  Medication Management: RN will administer medications as ordered by provider, will assess and evaluate patient's response and provide education to patient for prescribed medication. RN will report any adverse and/or side effects to prescribing provider.  Therapeutic Interventions: 1 on 1 counseling sessions, Psychoeducation, Medication administration, Evaluate responses to treatment, Monitor vital  signs and CBGs as ordered, Perform/monitor CIWA, COWS, AIMS and Fall Risk screenings as ordered, Perform wound care treatments as ordered.  Evaluation of Outcomes: Adequate for Discharge   LCSW Treatment Plan for Primary Diagnosis: Bipolar I disorder, current or most recent episode manic, with psychotic features (HCC) Long Term Goal(s): Safe transition to appropriate next level of care at discharge, Engage patient in therapeutic group addressing interpersonal concerns.  Short Term Goals: Engage patient in aftercare planning with referrals and resources, Increase social support, Increase ability to appropriately verbalize feelings, Increase emotional regulation,  Facilitate acceptance of mental health diagnosis and concerns, Facilitate patient progression through stages of change regarding substance use diagnoses and concerns, Identify triggers associated with mental health/substance abuse issues, and Increase skills for wellness and recovery  Therapeutic Interventions: Assess for all discharge needs, 1 to 1 time with Social worker, Explore available resources and support systems, Assess for adequacy in community support network, Educate family and significant other(s) on suicide prevention, Complete Psychosocial Assessment, Interpersonal group therapy.  Evaluation of Outcomes: Adequate for Discharge   Progress in Treatment: Attending groups: No. Participating in groups: No. Taking medication as prescribed: No. Toleration medication: Yes. Family/Significant other contact made: Yes, individual(s) contacted:  fiancee Patient understands diagnosis: No. Discussing patient identified problems/goals with staff: Yes. Medical problems stabilized or resolved: Yes. Denies suicidal/homicidal ideation: Yes. Issues/concerns per patient self-inventory: No.   New problem(s) identified: No, Describe:  None   New Short Term/Long Term Goal(s): medication stabilization, elimination of SI thoughts, development of comprehensive mental wellness plan.   Patient Goals: Did not attend   Discharge Plan or Barriers: Patient recently admitted. CSW will continue to follow and assess for appropriate referrals and possible discharge planning.   Reason for Continuation of Hospitalization: Aggression Anxiety Delusions  Medication stabilization  Estimated Length of Stay: 3 to 5 days   Attendees: Patient: Did not attend  03/22/2021   Physician: Rhea Belton, DO 03/22/2021   Nursing:  03/22/2021   RN Care Manager: 03/22/2021   Social Worker: Melba Coon, LCSWA 03/22/2021   Recreational Therapist:  03/22/2021   Other:  03/22/2021   Other:  03/22/2021   Other:  03/22/2021     Scribe for Treatment Team: Felizardo Hoffmann, LCSWA 03/22/2021 9:38 AM

## 2021-03-22 NOTE — Progress Notes (Signed)
Recreation Therapy Notes  Date: 7.29.22 Time: 1000 Location: 500 Hall Dayroom  Group Topic: Communication, Team Building, Problem Solving  Goal Area(s) Addresses:  Patient will effectively work with peer towards shared goal.  Patient will identify skills used to make activity successful.  Patient will share challenges and verbalize solution-driven approaches used. Patient will identify how skills used during activity can be used to reach post d/c goals.   Behavioral Response: Engaged  Intervention: STEM Activity   Activity: [employer]. Patients were provided the following materials: 5 drinking straws, 5 rubber bands, 5 paper clips, 2 index cards and 2 drinking cups. Using the provided materials patients were asked to build a launching mechanism to launch a ping pong ball across the room, approximately 10 feet. Patients were divided into teams of 3-5. Instructions required all materials be incorporated into the device, functionality of items left to the peer group's discretion.  Education: Pharmacist, community, Scientist, physiological, Air cabin crew, Building control surveyor.   Education Outcome: Acknowledges education/In group clarification offered/Needs additional education.   Clinical Observations/Feedback: Pt was bright and working well with peers to try and come up with a way to build the launcher.  Pt was called out of group by doctor.  Pt returned near the end of group.     Caroll Rancher, LRT/CTRS         Caroll Rancher A 03/22/2021 12:49 PM

## 2021-03-22 NOTE — [facility] Group Notes (Signed)
SPIRITUALITY GROUP NOTE   Spirituality group facilitated by Kathleen Argue, BCC.   Group Description: Group focused on topic of hope. Patients participated in facilitated discussion around topic, connecting with one another around experiences and definitions for hope. Group members engaged with visual explorer photos, reflecting on what hope looks like for them today. Group engaged in discussion around how their definitions of hope are present today in hospital.   Modalities: Psycho-social ed, Adlerian, Narrative, MI   Patient Progress: Gina Hooper attended group and engaged in the conversation appropriately.  At times she monopolized the group, but she was also an attentive listener.  Chaplain Dyanne Carrel, Bcc Pager, [phone] 11:04 PM

## 2021-03-22 NOTE — Progress Notes (Addendum)
Discharge Note:   AVS reviewed with Pt. No belongings PTA. Pt denies SI/HI/AVH. Pt escorted to lobby.

## 2021-03-22 NOTE — [facility] Suicide Risk Assessment (Signed)
[facility] Discharge Suicide Risk Assessment   Principal Problem: Bipolar I disorder, current or most recent episode manic, with psychotic features [facility]) Discharge Diagnoses: Principal Problem:   Bipolar I disorder, current or most recent episode manic, with psychotic features (HCC) Active Problems:   Polysubstance abuse (HCC)   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HCC)   Substance-induced psychotic disorder (HCC)   Total Time spent with patient: 20 minutes  Musculoskeletal: Strength & Muscle Tone: within normal limits Gait & Station: normal Patient leans: N/A  Psychiatric Specialty Exam  Presentation  General Appearance: Appropriate for Environment; Fairly Groomed  Eye Contact:Good  Speech:Clear and Coherent; Normal Rate  Speech Volume:Normal  Handedness:Right   Mood and Affect  Mood:Euthymic  Duration of Depression Symptoms: Greater than two weeks  Affect:Appropriate; Congruent   Thought Process  Thought Processes:Coherent; Goal Directed  Descriptions of Associations:Intact  Orientation:Full (Time, Place and Person)  Thought Content:Logical; WDL  History of Schizophrenia/Schizoaffective disorder:No  Duration of Psychotic Symptoms:Greater than six months  Hallucinations:Hallucinations: None  Ideas of Reference:None  Suicidal Thoughts:Suicidal Thoughts: No  Homicidal Thoughts:Homicidal Thoughts: No   Sensorium  Memory:Immediate Good; Recent Fair  Judgment:Fair  Insight:Fair   Executive Functions  Concentration:Good  Attention Span:Good  Recall:Good  Fund of Knowledge:Good  Language:Good   Psychomotor Activity  Psychomotor Activity:Psychomotor Activity: Normal   Assets  Assets:Communication Skills; Desire for Improvement; Housing; Social Support   Sleep  Sleep:Sleep: Good Number of Hours of Sleep: 7.25   Physical Exam: Physical Exam Vitals and nursing note reviewed.  Constitutional:      General: She is not in acute distress.     Appearance: Normal appearance. She is not diaphoretic.  HENT:     Head: Normocephalic and atraumatic.  Pulmonary:     Effort: Pulmonary effort is normal.  Neurological:     General: No focal deficit present.     Mental Status: She is alert and oriented to person, place, and time.   Review of Systems  Constitutional:  Negative for chills, diaphoresis and fever.  HENT: Negative.    Respiratory: Negative.    Cardiovascular: Negative.   Gastrointestinal: Negative.   Genitourinary: Negative.   Musculoskeletal: Negative.   Neurological: Negative.   Psychiatric/Behavioral:  Negative for depression, hallucinations and suicidal ideas. The patient is not nervous/anxious and does not have insomnia.   Blood pressure 116/71, pulse 78, temperature 98.4 F (36.9 C), temperature source Oral, resp. rate 18, height 5\' 5"  (1.651 m), weight 67 kg, SpO2 100 %. Body mass index is 24.58 kg/m.  Mental Status Per Nursing Assessment::   On Admission:  NA  Demographic Factors:  Low socioeconomic status  Loss Factors: NA  Historical Factors: Impulsivity  Risk Reduction Factors:   Sense of responsibility to family, Living with another person, especially a relative, Positive social support, and Positive therapeutic relationship  Continued Clinical Symptoms:  Bipolar Disorder - improved and nearing euthymia Alcohol/Substance Abuse/Dependencies Previous Psychiatric Diagnoses and Treatments  Cognitive Features That Contribute To Risk:  None    Suicide Risk:  Minimal acute risk: No identifiable suicidal ideation.  Patients presenting with no risk factors but with morbid ruminations; may be classified as minimal risk based on the severity of the depressive symptoms   Follow-up Information     [facility] Follow up on 03/28/2021.   Specialty: Behavioral Health Why: Appointment for Medications Management is scheduled for 03/28/2021 at 8:00 am.  This appointment will be held  in person.  For therapy services, please go during walk  in hours:  Monday through Wednesday from 8:00 am to 11:00 am. Contact information: [address] [place] [address] [phone]        ADS. Go to.   Why: Please go to this provider for substance abuse intensive outpatient therapy services during the walk in hours:  Monday through Friday, from 6:00 am to 10:30 am. Contact information: Contact information: [address] [place], [place] [address]   P: [phone] F: [phone]                Plan Of Care/Follow-up recommendations:  Activity:  as tolerated  Tests:  -You will periodically need to have blood drawn for lab work to monitor blood sugar and cholesterol levels while taking olanzapine/Zyprexa.   -You also will periodically need to have blood drawn for lab work to monitor lithium level, kidney function and thyroid function while taking lithium carbonate.   -Your outpatient doctor will let you know when lab work needs to be performed.  Other:  -Take medications as prescribed.   -Do not drink alcohol.  Do not use marijuana/cannabis, cocaine or any other street drugs. -Attend substance abuse treatment program.   -Keep outpatient mental health follow-up appointments with therapist and psychiatrist.  -See your lung doctor and your primary care provider for treatment of medical conditions.     Claudie Revering, MD 03/22/2021, 12:09 PM

## 2021-03-28 ENCOUNTER — Ambulatory Visit (HOSPITAL_COMMUNITY): Payer: Medicaid Other | Admitting: Psychiatry

## 2021-05-01 ENCOUNTER — Emergency Department (HOSPITAL_BASED_OUTPATIENT_CLINIC_OR_DEPARTMENT_OTHER)
Admission: EM | Admit: 2021-05-01 | Discharge: 2021-05-01 | Disposition: A | Discharge status: Home / Self Care | Payer: Medicaid Other | Attending: Emergency Medicine | Admitting: Emergency Medicine

## 2021-05-01 ENCOUNTER — Encounter (HOSPITAL_BASED_OUTPATIENT_CLINIC_OR_DEPARTMENT_OTHER): Payer: Self-pay

## 2021-05-01 ENCOUNTER — Other Ambulatory Visit: Payer: Self-pay

## 2021-05-01 DIAGNOSIS — R110 Nausea: Secondary | ICD-10-CM | POA: Insufficient documentation

## 2021-05-01 DIAGNOSIS — F17210 Nicotine dependence, cigarettes, uncomplicated: Secondary | ICD-10-CM | POA: Insufficient documentation

## 2021-05-01 DIAGNOSIS — R109 Unspecified abdominal pain: Secondary | ICD-10-CM | POA: Diagnosis present

## 2021-05-01 LAB — LITHIUM LEVEL: Lithium Lvl: <0.06 mmol/L — ABNORMAL LOW (ref 0.60–1.20)

## 2021-05-01 LAB — URINALYSIS, ROUTINE W REFLEX MICROSCOPIC
Bilirubin Urine: NEGATIVE
Glucose, UA: NEGATIVE mg/dL
Hgb urine dipstick: NEGATIVE
Ketones, ur: NEGATIVE mg/dL
Leukocytes,Ua: NEGATIVE
Nitrite: NEGATIVE
Protein, ur: NEGATIVE mg/dL
Specific Gravity, Urine: 1.03 (ref 1.005–1.030)
pH: 6 (ref 5.0–8.0)

## 2021-05-01 LAB — CBC
HCT: 33.5 % — ABNORMAL LOW (ref 36.0–46.0)
Hemoglobin: 11.1 g/dL — ABNORMAL LOW (ref 12.0–15.0)
MCH: 27.8 pg (ref 26.0–34.0)
MCHC: 33.1 g/dL (ref 30.0–36.0)
MCV: 83.8 fL (ref 80.0–100.0)
Platelets: 374 10*3/uL (ref 150–400)
RBC: 4 MIL/uL (ref 3.87–5.11)
RDW: 14.1 % (ref 11.5–15.5)
WBC: 12.7 10*3/uL — ABNORMAL HIGH (ref 4.0–10.5)
nRBC: 0 % (ref 0.0–0.2)

## 2021-05-01 LAB — COMPREHENSIVE METABOLIC PANEL
ALT: 13 U/L (ref 0–44)
AST: 17 U/L (ref 15–41)
Albumin: 4 g/dL (ref 3.5–5.0)
Alkaline Phosphatase: 102 U/L (ref 38–126)
Anion gap: 7 (ref 5–15)
BUN: 7 mg/dL (ref 6–20)
CO2: 26 mmol/L (ref 22–32)
Calcium: 9.4 mg/dL (ref 8.9–10.3)
Chloride: 106 mmol/L (ref 98–111)
Creatinine, Ser: 0.64 mg/dL (ref 0.44–1.00)
GFR, Estimated: >60 mL/min (ref >60)
Glucose, Bld: 96 mg/dL (ref 70–99)
Potassium: 3.5 mmol/L (ref 3.5–5.1)
Sodium: 139 mmol/L (ref 135–145)
Total Bilirubin: 0.4 mg/dL (ref 0.3–1.2)
Total Protein: 6.9 g/dL (ref 6.5–8.1)

## 2021-05-01 LAB — TSH: TSH: 0.896 u[IU]/mL (ref 0.350–4.500)

## 2021-05-01 LAB — T4, FREE: Free T4: 1.01 ng/dL (ref 0.61–1.12)

## 2021-05-01 LAB — LIPASE, BLOOD: Lipase: 23 U/L (ref 11–51)

## 2021-05-01 LAB — PREGNANCY, URINE: Preg Test, Ur: NEGATIVE

## 2021-05-01 MED ORDER — HYDROXYZINE HCL 25 MG PO TABS
25.0000 mg | ORAL_TABLET | Freq: Once | ORAL | Status: AC
  Administered 2021-05-01: 25 mg via ORAL
  Filled 2021-05-01: qty 1

## 2021-05-01 MED ORDER — LACTATED RINGERS IV BOLUS
1000.0000 mL | Freq: Once | INTRAVENOUS | Status: AC
  Administered 2021-05-01: 1000 mL via INTRAVENOUS

## 2021-05-01 MED ORDER — METOCLOPRAMIDE HCL 5 MG/ML IJ SOLN
10.0000 mg | Freq: Once | INTRAMUSCULAR | Status: AC
  Administered 2021-05-01: 10 mg via INTRAVENOUS
  Filled 2021-05-01: qty 2
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
## 2021-05-01 NOTE — ED Notes (Signed)
ED Provider at bedside. Dixon MD. 

## 2021-05-01 NOTE — ED Triage Notes (Signed)
Abdominal pain for the past two days, denies any n/v/d. Starting new meds and not sure if from that. Has hx of liver problems.

## 2021-05-02 ENCOUNTER — Ambulatory Visit: Payer: Self-pay | Admitting: Internal Medicine

## 2021-05-03 ENCOUNTER — Ambulatory Visit (INDEPENDENT_AMBULATORY_CARE_PROVIDER_SITE_OTHER): Payer: Medicaid Other | Admitting: Gastroenterology

## 2021-05-03 ENCOUNTER — Encounter: Payer: Self-pay | Admitting: Gastroenterology

## 2021-05-03 VITALS — BP 110/66 | HR 74 | Ht 66.0 in | Wt 146.0 lb

## 2021-05-03 DIAGNOSIS — R1084 Generalized abdominal pain: Secondary | ICD-10-CM

## 2021-05-06 DIAGNOSIS — R1084 Generalized abdominal pain: Secondary | ICD-10-CM | POA: Insufficient documentation

## 2021-05-06 HISTORY — DX: Generalized abdominal pain: R10.84

## 2021-05-06 NOTE — Progress Notes (Signed)
I went in at 3:40 PM (her appointment was at 3:30 PM). Patient had left and not told the staff. Front desk staff had thought that her clinic visit was over and so did not think much when she left the building. Thus I did not get to evaluate the patient. We will be happy to see the patient in follow-up if she desires.   Corliss Parish, MD [facility] Office # [phone]

## 2021-06-10 ENCOUNTER — Encounter (HOSPITAL_COMMUNITY): Payer: Self-pay | Admitting: Psychiatry

## 2021-06-10 ENCOUNTER — Ambulatory Visit (INDEPENDENT_AMBULATORY_CARE_PROVIDER_SITE_OTHER): Payer: Medicaid Other | Admitting: Psychiatry

## 2021-06-10 VITALS — BP 108/74 | HR 65 | Ht 66.0 in | Wt 154.0 lb

## 2021-06-10 DIAGNOSIS — F319 Bipolar disorder, unspecified: Secondary | ICD-10-CM | POA: Diagnosis not present

## 2021-06-10 DIAGNOSIS — F411 Generalized anxiety disorder: Secondary | ICD-10-CM | POA: Diagnosis not present

## 2021-06-10 MED ORDER — HYDROXYZINE HCL 25 MG PO TABS: 25.0000 mg | ORAL_TABLET | Freq: Three times a day (TID) | ORAL | 3 refills | Status: DC | PRN

## 2021-06-10 MED ORDER — ARIPIPRAZOLE ER 400 MG IM PRSY
400.0000 mg | PREFILLED_SYRINGE | Freq: Once | INTRAMUSCULAR | Status: AC
  Administered 2021-06-20: 400 mg via INTRAMUSCULAR

## 2021-06-10 MED ORDER — ARIPIPRAZOLE ER 400 MG IM PRSY: 400.0000 mg | PREFILLED_SYRINGE | INTRAMUSCULAR | 11 refills | Status: DC

## 2021-06-10 MED ORDER — MIRTAZAPINE 15 MG PO TABS: 15.0000 mg | ORAL_TABLET | Freq: Every day | ORAL | 3 refills | Status: DC
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
## 2021-06-11 ENCOUNTER — Telehealth (HOSPITAL_COMMUNITY): Payer: Self-pay | Admitting: *Deleted

## 2021-06-11 ENCOUNTER — Ambulatory Visit (HOSPITAL_COMMUNITY): Payer: Medicaid Other

## 2021-06-11 NOTE — Telephone Encounter (Signed)
[facility] TRACKS APPROVED  ARIPiprazole ER (ABILIFY MAINTENA) 400 MG prefilled syringe .  P.A.#  [id number]  EFFECTIVE : 06-11-2021 THRU  06-11-2022

## 2021-06-18 ENCOUNTER — Ambulatory Visit (HOSPITAL_COMMUNITY): Payer: Medicaid Other

## 2021-06-18 ENCOUNTER — Telehealth (HOSPITAL_COMMUNITY): Payer: Self-pay | Admitting: *Deleted

## 2021-06-19 ENCOUNTER — Ambulatory Visit (HOSPITAL_COMMUNITY): Payer: Medicaid Other

## 2021-06-20 ENCOUNTER — Ambulatory Visit (HOSPITAL_COMMUNITY): Payer: Medicaid Other | Admitting: *Deleted

## 2021-06-20 ENCOUNTER — Other Ambulatory Visit: Payer: Self-pay

## 2021-06-20 ENCOUNTER — Encounter (HOSPITAL_COMMUNITY): Payer: Self-pay

## 2021-06-20 VITALS — BP 126/82 | HR 76 | Ht 66.0 in | Wt 158.0 lb

## 2021-06-20 DIAGNOSIS — F319 Bipolar disorder, unspecified: Secondary | ICD-10-CM

## 2021-06-20 NOTE — Progress Notes (Signed)
In for her first injection of Abilify M 400 mg today. She was first trialed on oral abilify and in the past when she had been hospitalized she has had at least one injection of Abilify per her report. She states she is motivated for this shot, states last time she got sick she got"way out there" and it really scared her. She is pleasant and appropriate. She is attractive and well presented. She reports when she has sx of her illness she has AH and doesn't sleep and gets really active. She denies these sx now. She got her shot today in her R Deltoid without difficulty. She is to return in 27 days due to the Thanksgiving holiday.

## 2021-07-17 ENCOUNTER — Ambulatory Visit (HOSPITAL_COMMUNITY): Payer: Medicaid Other | Admitting: *Deleted

## 2021-07-17 ENCOUNTER — Encounter (HOSPITAL_COMMUNITY): Payer: Self-pay

## 2021-07-17 VITALS — BP 106/74 | HR 71 | Ht 66.0 in | Wt 168.0 lb

## 2021-07-17 DIAGNOSIS — F319 Bipolar disorder, unspecified: Secondary | ICD-10-CM

## 2021-07-17 MED ORDER — ARIPIPRAZOLE ER 400 MG IM PRSY
400.0000 mg | PREFILLED_SYRINGE | INTRAMUSCULAR | Status: DC
  Administered 2021-07-17 – 2024-03-17 (×25): 400 mg via INTRAMUSCULAR

## 2021-07-17 NOTE — Progress Notes (Signed)
In as scheduled for her second Abilify M 400 mg injection. She has mcd and brings her own shot. She states she is glad to be here today to get her shot because she could feel "her schizophrenia was coming back" states she felt her medicine was wearing off. She got her shot today in her L DELTOID. Scheduled her back on a Tues so she can be seen by a NP to discuss any changes needed to respond to her report the medicine wears off before her next shot is due. Today she is pleasant, well presented and only compliant is increasing paranoia in the last week.

## 2021-07-29 ENCOUNTER — Ambulatory Visit (INDEPENDENT_AMBULATORY_CARE_PROVIDER_SITE_OTHER): Payer: Medicaid Other | Admitting: Clinical

## 2021-07-29 ENCOUNTER — Other Ambulatory Visit: Payer: Self-pay

## 2021-07-29 DIAGNOSIS — F319 Bipolar disorder, unspecified: Secondary | ICD-10-CM
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
## 2021-08-13 ENCOUNTER — Ambulatory Visit (INDEPENDENT_AMBULATORY_CARE_PROVIDER_SITE_OTHER): Payer: Medicaid Other | Admitting: Family

## 2021-08-13 ENCOUNTER — Encounter (HOSPITAL_COMMUNITY): Payer: Self-pay

## 2021-08-13 ENCOUNTER — Other Ambulatory Visit: Payer: Self-pay

## 2021-08-13 ENCOUNTER — Encounter (HOSPITAL_COMMUNITY): Payer: Self-pay | Admitting: Family

## 2021-08-13 ENCOUNTER — Ambulatory Visit (INDEPENDENT_AMBULATORY_CARE_PROVIDER_SITE_OTHER): Payer: Medicaid Other | Admitting: *Deleted

## 2021-08-13 VITALS — BP 118/66 | HR 82 | Ht 66.0 in | Wt 172.0 lb

## 2021-08-13 DIAGNOSIS — F319 Bipolar disorder, unspecified: Secondary | ICD-10-CM

## 2021-08-13 DIAGNOSIS — F411 Generalized anxiety disorder: Secondary | ICD-10-CM | POA: Diagnosis not present

## 2021-08-13 MED ORDER — ARIPIPRAZOLE ER 400 MG IM PRSY: 400.0000 mg | PREFILLED_SYRINGE | INTRAMUSCULAR | Status: AC

## 2021-08-13 NOTE — Progress Notes (Signed)
Patient has arrived for her Injection ARIPiprazole ER (ABILIFY MAINTENA) 400 MG  today after finishing her 1st Therapy appt. And she's feeling pleased & satisfied with Therapist.  Pleasant as Always & Tolerated Injection Well in Right-Arm
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
## 2021-08-15 ENCOUNTER — Ambulatory Visit (HOSPITAL_COMMUNITY): Payer: Medicaid Other

## 2021-09-10 ENCOUNTER — Encounter (HOSPITAL_COMMUNITY): Payer: Self-pay

## 2021-09-10 ENCOUNTER — Other Ambulatory Visit: Payer: Self-pay

## 2021-09-10 ENCOUNTER — Ambulatory Visit (HOSPITAL_COMMUNITY): Payer: Medicaid Other | Admitting: *Deleted

## 2021-09-10 VITALS — BP 115/74 | HR 89 | Ht 66.0 in | Wt 173.0 lb

## 2021-09-10 DIAGNOSIS — F319 Bipolar disorder, unspecified: Secondary | ICD-10-CM
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
## 2021-09-11 ENCOUNTER — Encounter (HOSPITAL_COMMUNITY): Payer: Medicaid Other | Admitting: Psychiatry

## 2021-09-23 ENCOUNTER — Ambulatory Visit (HOSPITAL_COMMUNITY): Payer: Medicaid Other | Admitting: Clinical

## 2021-10-05 IMAGING — DX DG CHEST 2V
2 series · 2 of 2 positions shown · non-contrast
Comparison: 04/14/2020 chest radiograph.

CLINICAL DATA: Dyspnea

EXAM:
CHEST - 2 VIEW

[chest pa]
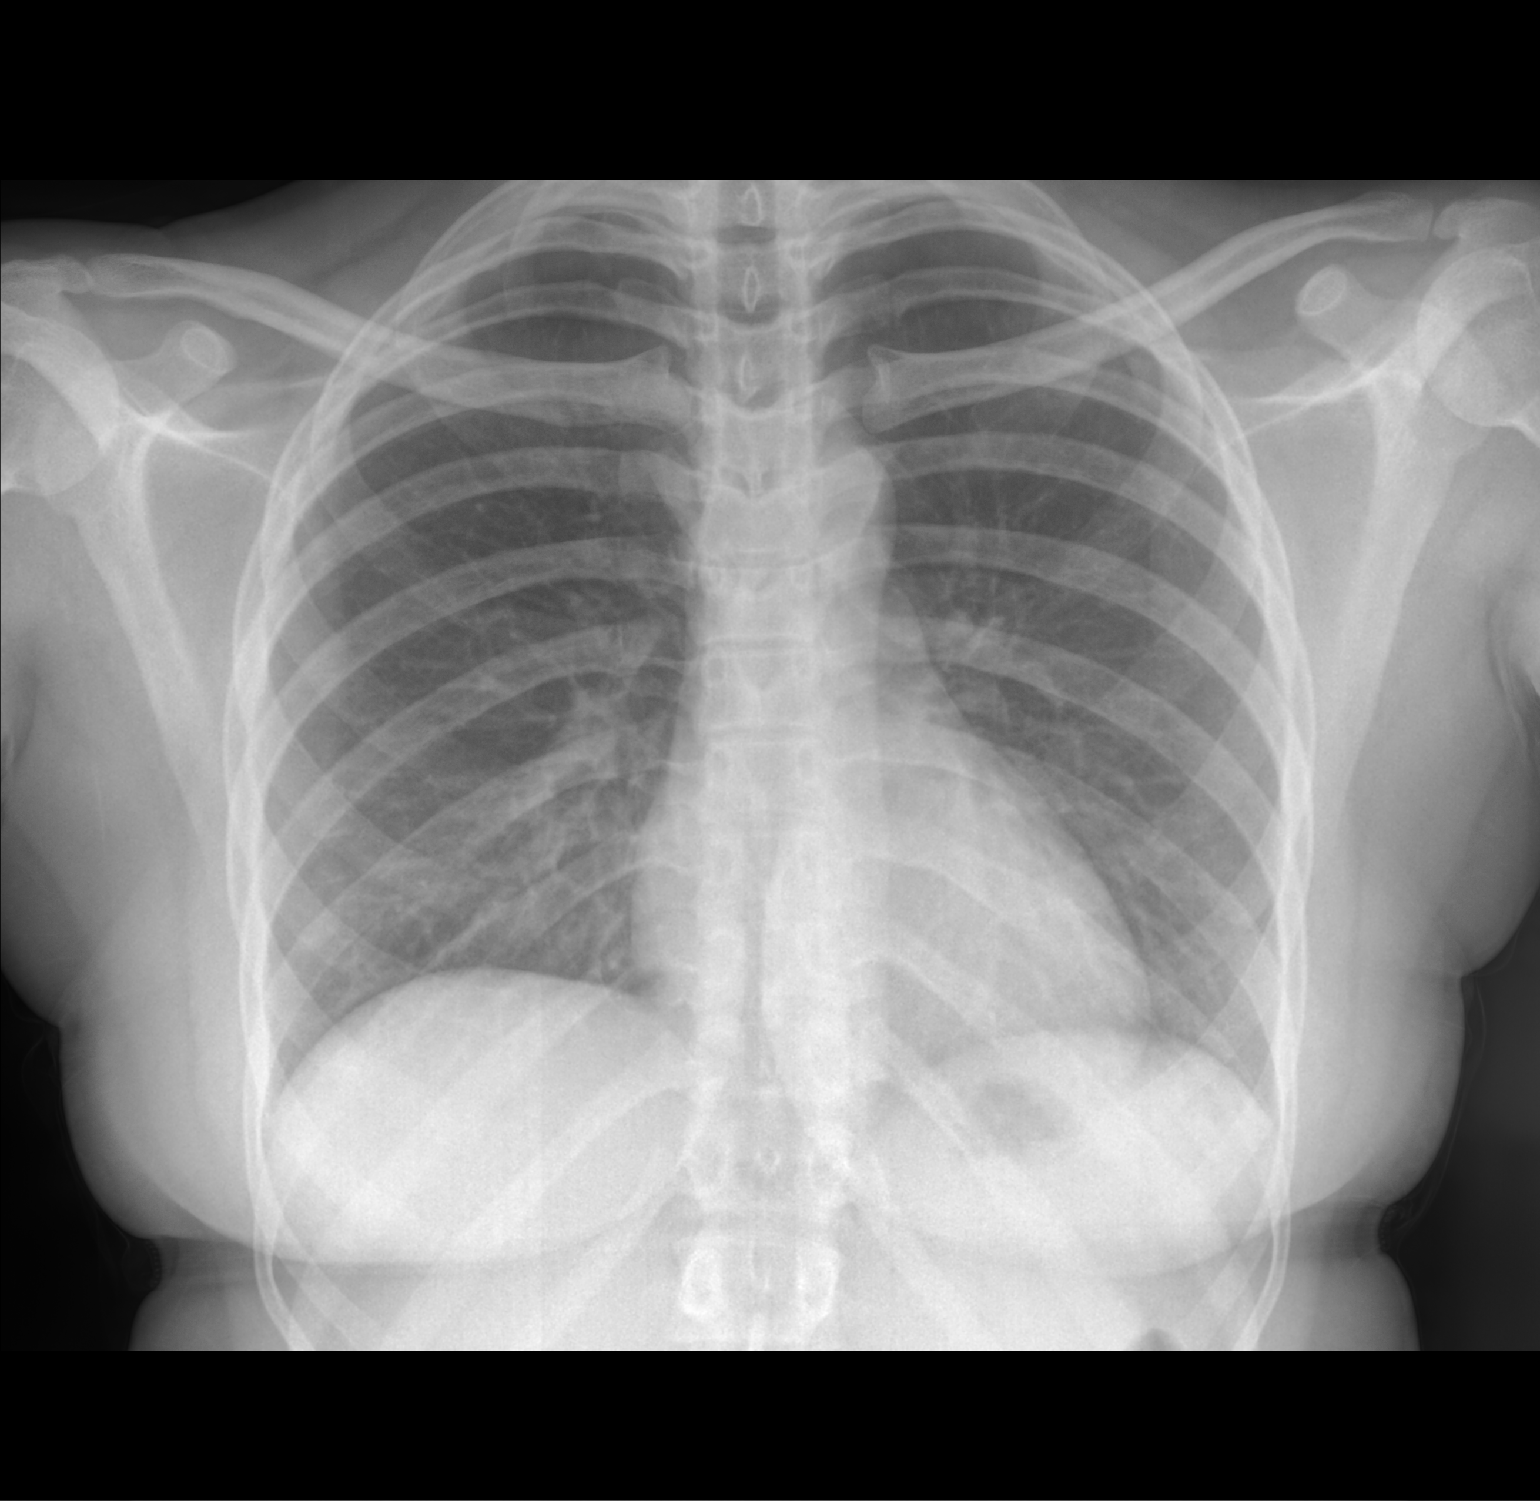

[chest lat]
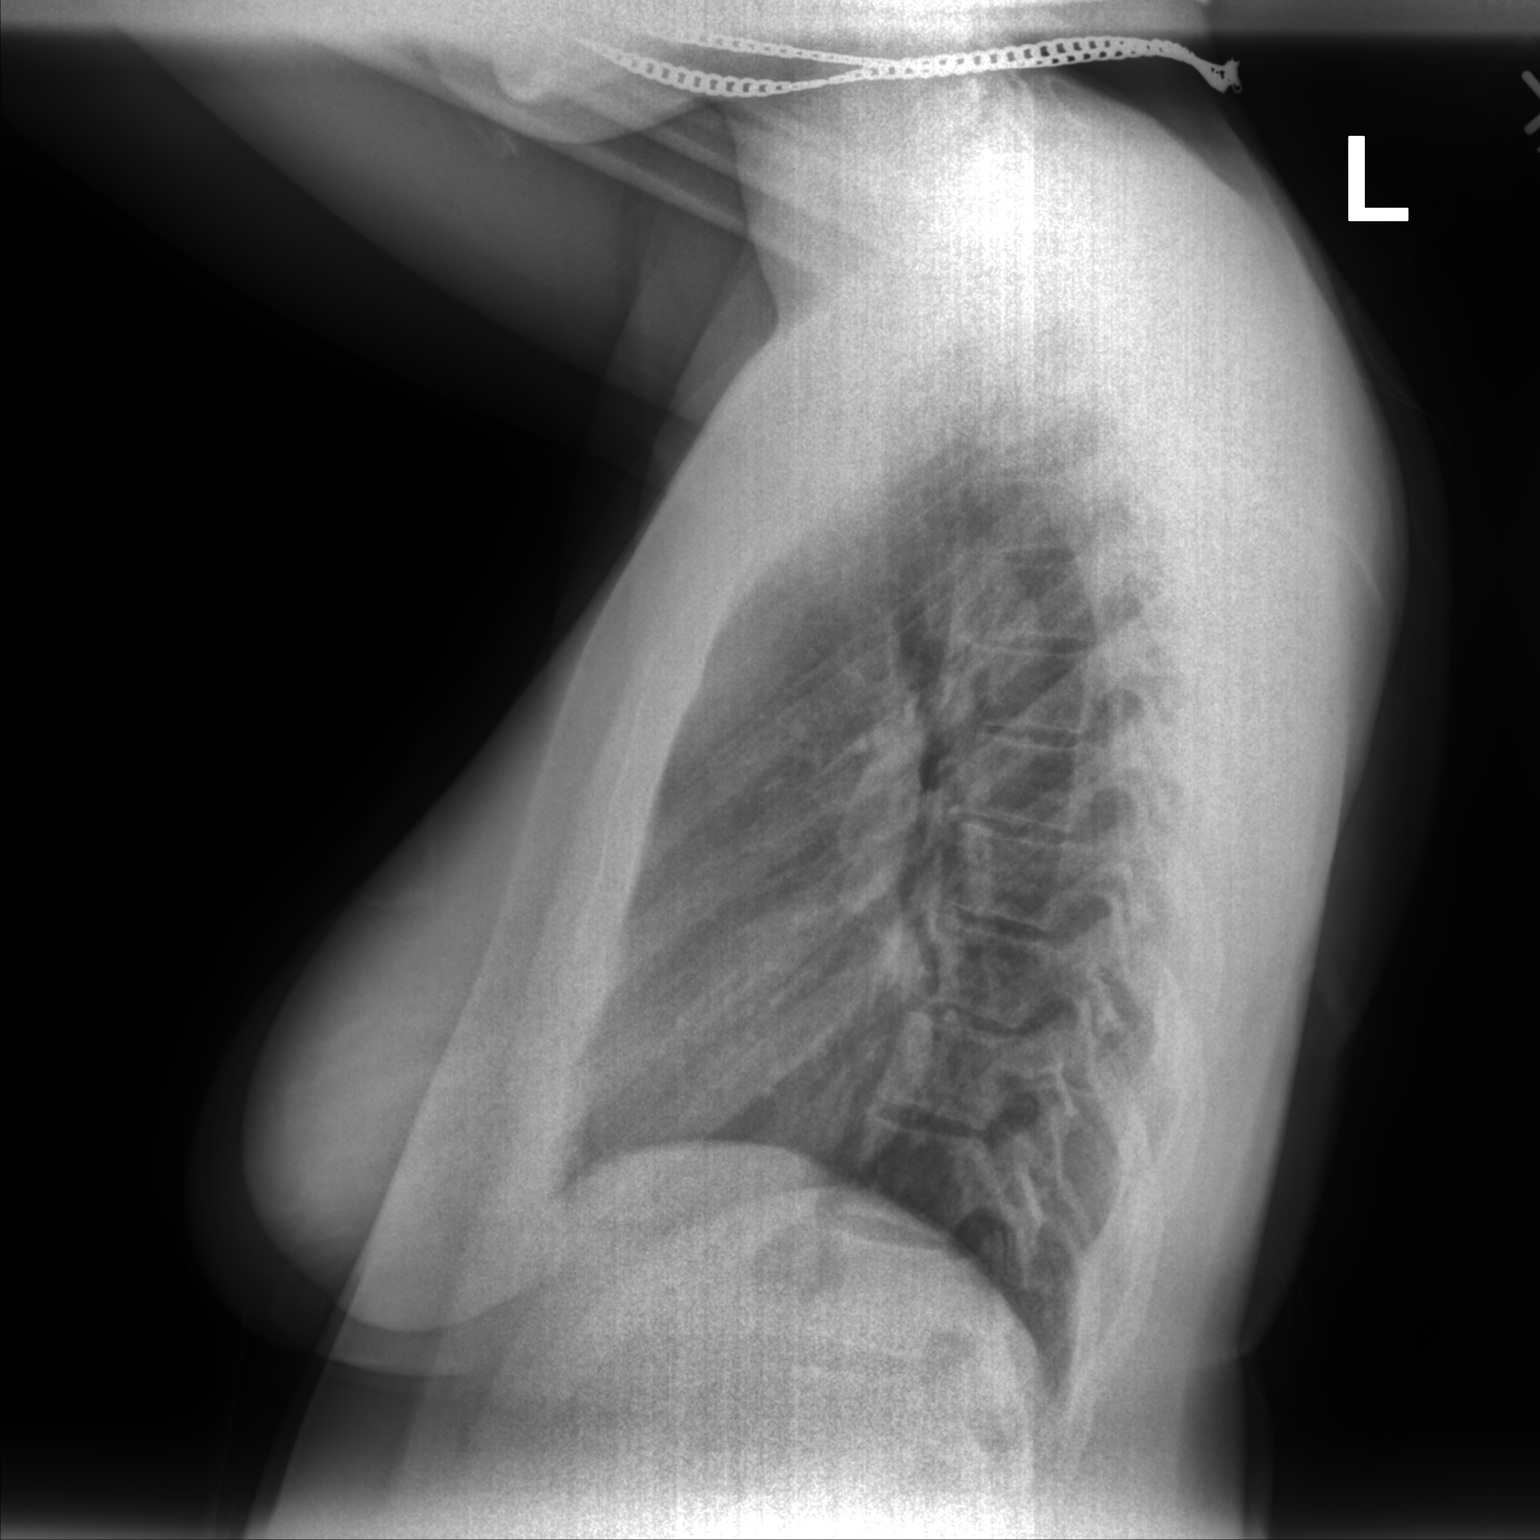

[2 of 2 positions shown; findings below may reference images not displayed]

FINDINGS: Stable cardiomediastinal silhouette with normal heart size. No
pneumothorax. No pleural effusion. Lungs appear clear, with no acute
consolidative airspace disease and no pulmonary edema.
IMPRESSION: No active cardiopulmonary disease.

## 2021-10-07 ENCOUNTER — Ambulatory Visit (HOSPITAL_COMMUNITY): Payer: Medicaid Other | Admitting: Clinical

## 2021-10-08 ENCOUNTER — Other Ambulatory Visit: Payer: Self-pay

## 2021-10-08 ENCOUNTER — Encounter (HOSPITAL_COMMUNITY): Payer: Self-pay

## 2021-10-08 ENCOUNTER — Ambulatory Visit (HOSPITAL_COMMUNITY): Payer: Medicaid Other | Admitting: *Deleted

## 2021-10-08 VITALS — BP 124/72 | HR 75 | Ht 66.0 in | Wt 181.0 lb

## 2021-10-08 DIAGNOSIS — F319 Bipolar disorder, unspecified: Secondary | ICD-10-CM

## 2021-10-08 NOTE — Progress Notes (Signed)
PATIENT ARRIVED FOR ARIPiprazole ER (ABILIFY MAINTENA) 400 MG INJECTION. INJECTION TOLERATED WELL IN RIGHT-ARM. PLEASANT AS ALWAYS.

## 2021-10-30 ENCOUNTER — Other Ambulatory Visit (HOSPITAL_COMMUNITY): Payer: Self-pay | Admitting: Psychiatry

## 2021-10-30 DIAGNOSIS — F411 Generalized anxiety disorder: Secondary | ICD-10-CM

## 2021-10-31 ENCOUNTER — Telehealth (HOSPITAL_COMMUNITY): Payer: Self-pay | Admitting: *Deleted

## 2021-10-31 ENCOUNTER — Other Ambulatory Visit (HOSPITAL_COMMUNITY): Payer: Self-pay | Admitting: Registered Nurse

## 2021-10-31 DIAGNOSIS — F319 Bipolar disorder, unspecified: Secondary | ICD-10-CM

## 2021-10-31 DIAGNOSIS — F411 Generalized anxiety disorder: Secondary | ICD-10-CM

## 2021-10-31 MED ORDER — MIRTAZAPINE 15 MG PO TABS: 15.0000 mg | ORAL_TABLET | Freq: Every day | ORAL | 3 refills | Status: DC

## 2021-10-31 MED ORDER — HYDROXYZINE HCL 25 MG PO TABS: 25.0000 mg | ORAL_TABLET | Freq: Three times a day (TID) | ORAL | 3 refills | Status: DC | PRN

## 2021-10-31 NOTE — Telephone Encounter (Signed)
Call from patient requesting new rxs for her Hydroxyzine and her Mirtazapine. She should be out and will ask Shuvon Rankin NP to escribe her medicine to her preferred pharmacy. ? ?

## 2021-11-05 ENCOUNTER — Other Ambulatory Visit: Payer: Self-pay

## 2021-11-05 ENCOUNTER — Encounter (HOSPITAL_COMMUNITY): Payer: Self-pay | Admitting: Family

## 2021-11-05 ENCOUNTER — Ambulatory Visit (INDEPENDENT_AMBULATORY_CARE_PROVIDER_SITE_OTHER): Payer: Medicaid Other | Admitting: Family

## 2021-11-05 ENCOUNTER — Ambulatory Visit (HOSPITAL_COMMUNITY): Payer: Medicaid Other | Admitting: *Deleted

## 2021-11-05 VITALS — BP 118/90 | HR 91 | Ht 66.0 in | Wt 182.0 lb

## 2021-11-05 DIAGNOSIS — F319 Bipolar disorder, unspecified: Secondary | ICD-10-CM

## 2021-11-05 DIAGNOSIS — F411 Generalized anxiety disorder: Secondary | ICD-10-CM

## 2021-11-05 MED ORDER — MIRTAZAPINE 15 MG PO TABS: 15.0000 mg | ORAL_TABLET | Freq: Every day | ORAL | 3 refills | Status: DC

## 2021-11-05 MED ORDER — HYDROXYZINE HCL 25 MG PO TABS: 25.0000 mg | ORAL_TABLET | Freq: Three times a day (TID) | ORAL | 3 refills | Status: DC | PRN

## 2021-11-05 MED ORDER — ABILIFY MAINTENA 400 MG IM PRSY: 400.0000 mg | PREFILLED_SYRINGE | INTRAMUSCULAR | 3 refills | Status: DC

## 2021-11-05 MED ORDER — ARIPIPRAZOLE ER 400 MG IM PRSY: 400.0000 mg | PREFILLED_SYRINGE | INTRAMUSCULAR | 3 refills | Status: DC

## 2021-11-05 NOTE — Progress Notes (Signed)
Patient arrived for ABILIFY MAINTENA 400 MG Tolerated injection well  in Left-Am ?Pleasant as Always . Informed that she will be moving back with Grandmother & she's pleased & happy ?
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
## 2021-12-03 ENCOUNTER — Ambulatory Visit (INDEPENDENT_AMBULATORY_CARE_PROVIDER_SITE_OTHER): Payer: Medicaid Other | Admitting: *Deleted

## 2021-12-03 ENCOUNTER — Encounter (HOSPITAL_COMMUNITY): Payer: Self-pay

## 2021-12-03 VITALS — BP 115/71 | HR 95 | Ht 66.0 in | Wt 186.0 lb

## 2021-12-03 DIAGNOSIS — F319 Bipolar disorder, unspecified: Secondary | ICD-10-CM | POA: Diagnosis not present
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
## 2021-12-31 ENCOUNTER — Encounter (HOSPITAL_COMMUNITY): Payer: Self-pay

## 2021-12-31 ENCOUNTER — Other Ambulatory Visit (HOSPITAL_COMMUNITY): Payer: Self-pay | Admitting: Registered Nurse

## 2021-12-31 ENCOUNTER — Ambulatory Visit (INDEPENDENT_AMBULATORY_CARE_PROVIDER_SITE_OTHER): Payer: Medicaid Other | Admitting: *Deleted

## 2021-12-31 VITALS — BP 114/70 | HR 82 | Wt 183.0 lb

## 2021-12-31 DIAGNOSIS — F319 Bipolar disorder, unspecified: Secondary | ICD-10-CM

## 2021-12-31 DIAGNOSIS — F411 Generalized anxiety disorder: Secondary | ICD-10-CM

## 2021-12-31 MED ORDER — MIRTAZAPINE 15 MG PO TABS: 15.0000 mg | ORAL_TABLET | Freq: Every day | ORAL | 3 refills | Status: DC

## 2021-12-31 MED ORDER — HYDROXYZINE HCL 25 MG PO TABS: 25.0000 mg | ORAL_TABLET | Freq: Three times a day (TID) | ORAL | 3 refills | Status: DC | PRN

## 2021-12-31 MED ORDER — BENZTROPINE MESYLATE 0.5 MG PO TABS: 0.5000 mg | ORAL_TABLET | Freq: Two times a day (BID) | ORAL | 0 refills | Status: DC

## 2021-12-31 MED ORDER — ARIPIPRAZOLE ER 400 MG IM PRSY: 400.0000 mg | PREFILLED_SYRINGE | INTRAMUSCULAR | 3 refills | Status: DC

## 2021-12-31 NOTE — Progress Notes (Signed)
Administered left deltoid  IM -abilify maintena 400 mg. Pt tolerated well. ?

## 2022-01-30 ENCOUNTER — Telehealth (HOSPITAL_COMMUNITY): Payer: Self-pay | Admitting: *Deleted

## 2022-01-30 ENCOUNTER — Ambulatory Visit (INDEPENDENT_AMBULATORY_CARE_PROVIDER_SITE_OTHER): Payer: Medicaid Other | Admitting: Registered Nurse

## 2022-01-30 ENCOUNTER — Encounter (HOSPITAL_COMMUNITY): Payer: Self-pay | Admitting: Registered Nurse

## 2022-01-30 ENCOUNTER — Ambulatory Visit (INDEPENDENT_AMBULATORY_CARE_PROVIDER_SITE_OTHER): Payer: Medicaid Other | Admitting: *Deleted

## 2022-01-30 ENCOUNTER — Encounter (HOSPITAL_COMMUNITY): Payer: Self-pay

## 2022-01-30 VITALS — BP 117/75 | HR 84 | Ht 66.0 in | Wt 185.0 lb

## 2022-01-30 DIAGNOSIS — F319 Bipolar disorder, unspecified: Secondary | ICD-10-CM

## 2022-01-30 DIAGNOSIS — F411 Generalized anxiety disorder: Secondary | ICD-10-CM

## 2022-01-30 MED ORDER — ARIPIPRAZOLE ER 400 MG IM PRSY: 400.0000 mg | PREFILLED_SYRINGE | INTRAMUSCULAR | 3 refills | Status: DC

## 2022-01-30 MED ORDER — MIRTAZAPINE 15 MG PO TABS: 15.0000 mg | ORAL_TABLET | Freq: Every day | ORAL | 3 refills | Status: DC

## 2022-01-30 MED ORDER — HYDROXYZINE HCL 25 MG PO TABS: 25.0000 mg | ORAL_TABLET | Freq: Three times a day (TID) | ORAL | 3 refills | Status: DC | PRN

## 2022-01-30 NOTE — Telephone Encounter (Signed)
PA done for patients Abilify M 400 mg injection. Valid till 01/25/23 and PA #[id number]. Pharmacy notified.

## 2022-01-30 NOTE — Progress Notes (Signed)
In as scheduled for monthly injection of Abiliify M 400 mg, given today in her R deltoid without issue. She is pleasant and verbal Seen by [facility] NP for 3 month assessment and shared with her she was having A/H esp near shot being due. No changes made to her medicine today, see Shuvons NP note for information. She is to return in one month for her next shot.
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
## 2022-02-27 ENCOUNTER — Ambulatory Visit (INDEPENDENT_AMBULATORY_CARE_PROVIDER_SITE_OTHER): Payer: Medicaid Other | Admitting: *Deleted

## 2022-02-27 ENCOUNTER — Encounter (HOSPITAL_COMMUNITY): Payer: Self-pay

## 2022-02-27 VITALS — BP 107/80 | HR 78 | Ht 66.0 in | Wt 180.0 lb

## 2022-02-27 DIAGNOSIS — F319 Bipolar disorder, unspecified: Secondary | ICD-10-CM | POA: Diagnosis not present

## 2022-02-27 NOTE — Progress Notes (Signed)
PATIENT ARRIVED FOR INJECTION: ARIPiprazole ER (ABILIFY MAINTENA) 400 MG  TOLERATED INJECTION WELL  LEFT ARM  PLEASANT AS ALWAYS . STATED EVERYTHING IS GOING WELL & BETTER.

## 2022-03-25 ENCOUNTER — Ambulatory Visit (INDEPENDENT_AMBULATORY_CARE_PROVIDER_SITE_OTHER): Payer: Medicaid Other | Admitting: *Deleted

## 2022-03-25 ENCOUNTER — Encounter (HOSPITAL_COMMUNITY): Payer: Self-pay

## 2022-03-25 VITALS — BP 120/77 | HR 79 | Ht 66.0 in | Wt 181.0 lb

## 2022-03-25 DIAGNOSIS — F319 Bipolar disorder, unspecified: Secondary | ICD-10-CM

## 2022-03-25 NOTE — Progress Notes (Signed)
Patient Arrived For her Injection ARIPiprazole ER (ABILIFY MAINTENA)  400 MG  Pleasant as Always Tolerated  Well in Right-Arm  NO HI/SI   NOR AH/VH

## 2022-05-23 ENCOUNTER — Encounter (HOSPITAL_COMMUNITY): Payer: Self-pay

## 2022-05-23 ENCOUNTER — Ambulatory Visit (INDEPENDENT_AMBULATORY_CARE_PROVIDER_SITE_OTHER): Payer: Medicaid Other | Admitting: *Deleted

## 2022-05-23 VITALS — BP 123/82 | HR 77 | Ht 66.0 in | Wt 190.0 lb

## 2022-05-23 DIAGNOSIS — F319 Bipolar disorder, unspecified: Secondary | ICD-10-CM

## 2022-05-23 NOTE — Progress Notes (Signed)
PATIENT ARRIVED FOR INJECTION -- ARIPiprazole ER (ABILIFY MAINTENA) 400 MG  TOLERATED WELL IN LEFT-ARM .Marland Kitchen PLEASANT AS ALWAYS

## 2022-06-18 ENCOUNTER — Telehealth (HOSPITAL_COMMUNITY): Payer: Self-pay | Admitting: *Deleted

## 2022-06-18 NOTE — Telephone Encounter (Signed)
Fax request for patients Abilify M 400 mg from [place]. PA completed via phone with [employer]. It has been approved PA # [id number], and its effective till 06/13/23.notified the phamarcy.

## 2022-06-19 ENCOUNTER — Ambulatory Visit (HOSPITAL_COMMUNITY): Payer: Medicaid Other

## 2022-06-19 ENCOUNTER — Other Ambulatory Visit (HOSPITAL_COMMUNITY): Payer: Self-pay | Admitting: Psychiatry

## 2022-06-19 DIAGNOSIS — F319 Bipolar disorder, unspecified: Secondary | ICD-10-CM

## 2022-06-19 MED ORDER — ARIPIPRAZOLE ER 400 MG IM PRSY: 400.0000 mg | PREFILLED_SYRINGE | INTRAMUSCULAR | 3 refills | Status: DC

## 2022-07-30 ENCOUNTER — Telehealth (HOSPITAL_COMMUNITY): Payer: Self-pay | Admitting: *Deleted

## 2022-07-30 NOTE — Telephone Encounter (Signed)
Rx  [id number] - [place] ([place]), [place] - [address]  Refill Request mirtazapine mirtazapine (REMERON) 15 MG tablet

## 2022-08-14 ENCOUNTER — Ambulatory Visit (INDEPENDENT_AMBULATORY_CARE_PROVIDER_SITE_OTHER): Payer: Medicaid Other | Admitting: Registered Nurse

## 2022-08-14 ENCOUNTER — Encounter (HOSPITAL_COMMUNITY): Payer: Self-pay | Admitting: Registered Nurse

## 2022-08-14 ENCOUNTER — Ambulatory Visit (INDEPENDENT_AMBULATORY_CARE_PROVIDER_SITE_OTHER): Payer: Medicaid Other | Admitting: *Deleted

## 2022-08-14 ENCOUNTER — Encounter (HOSPITAL_COMMUNITY): Payer: Self-pay

## 2022-08-14 VITALS — BP 107/94 | HR 86 | Ht 66.0 in | Wt 188.0 lb

## 2022-08-14 DIAGNOSIS — F411 Generalized anxiety disorder: Secondary | ICD-10-CM | POA: Diagnosis not present

## 2022-08-14 DIAGNOSIS — F319 Bipolar disorder, unspecified: Secondary | ICD-10-CM | POA: Diagnosis not present

## 2022-08-14 MED ORDER — HYDROXYZINE HCL 25 MG PO TABS: 25.0000 mg | ORAL_TABLET | Freq: Three times a day (TID) | ORAL | 3 refills | Status: DC | PRN

## 2022-08-14 MED ORDER — ARIPIPRAZOLE ER 400 MG IM PRSY: 400.0000 mg | PREFILLED_SYRINGE | INTRAMUSCULAR | 3 refills | Status: DC

## 2022-08-14 MED ORDER — MIRTAZAPINE 15 MG PO TABS: 15.0000 mg | ORAL_TABLET | Freq: Every day | ORAL | 3 refills | Status: DC
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
## 2022-08-14 NOTE — Progress Notes (Signed)
In months late for her shot. Denies any psychotic sx. States she has been a little depressed. She was seen by the provider today for her 3 month eval, though today its been 6 months since she has seen the provider. She got her Abilify M 400 mg shot in her R DELTOID today without issue. Encouraged to be more consistant with her appts to better assure mental wellness. She agreed. To return in 28 days.

## 2022-08-19 ENCOUNTER — Other Ambulatory Visit (HOSPITAL_COMMUNITY): Payer: Self-pay

## 2022-09-09 ENCOUNTER — Telehealth (HOSPITAL_COMMUNITY): Payer: Self-pay

## 2022-09-09 NOTE — Telephone Encounter (Signed)
Medication management - Telephone call with [place] [employer] to assist pt with completing her needed prior authorization for Abilify Maintena 400 mg IM q 28 days. Medication approved until 09/04/23, [id number]. Called pt's [employer] in [place] to inform of approved medication and they will ship out today to the [facility], 2nd floor to be there for patient when she comes in on 09/11/22 for her next due injection. Message left for patient to remind of appointment set for the [facility] on Thursday 09/11/22 and informed her medication was being shipped there to be ready for her that day.

## 2022-09-11 ENCOUNTER — Encounter (HOSPITAL_COMMUNITY): Payer: Self-pay

## 2022-09-11 ENCOUNTER — Ambulatory Visit (INDEPENDENT_AMBULATORY_CARE_PROVIDER_SITE_OTHER): Payer: Medicaid Other | Admitting: Psychiatry

## 2022-09-11 ENCOUNTER — Ambulatory Visit (HOSPITAL_COMMUNITY): Payer: Medicaid Other

## 2022-09-11 VITALS — BP 122/72 | HR 87 | Ht 66.0 in | Wt 189.0 lb

## 2022-09-11 DIAGNOSIS — F411 Generalized anxiety disorder: Secondary | ICD-10-CM | POA: Diagnosis not present

## 2022-09-11 DIAGNOSIS — F319 Bipolar disorder, unspecified: Secondary | ICD-10-CM

## 2022-09-11 MED ORDER — HYDROXYZINE HCL 25 MG PO TABS: 25.0000 mg | ORAL_TABLET | Freq: Three times a day (TID) | ORAL | 3 refills | Status: DC | PRN

## 2022-09-11 MED ORDER — MIRTAZAPINE 30 MG PO TABS: 30.0000 mg | ORAL_TABLET | Freq: Every day | ORAL | 3 refills | Status: DC

## 2022-09-11 MED ORDER — MIRTAZAPINE 15 MG PO TABS: 15.0000 mg | ORAL_TABLET | Freq: Every day | ORAL | 3 refills | Status: DC

## 2022-09-11 MED ORDER — ARIPIPRAZOLE ER 400 MG IM PRSY: 400.0000 mg | PREFILLED_SYRINGE | INTRAMUSCULAR | 3 refills | Status: DC

## 2022-09-11 NOTE — Progress Notes (Signed)
PATIENT ARRIVED FOR INJECTION -- ARIPiprazole ER (ABILIFY MAINTENA) 400 MG  TOLERATED WELL IN LEFT-ARM .Marland Kitchen PLEASANT AS ALWAYS
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
## 2022-10-09 ENCOUNTER — Encounter (HOSPITAL_COMMUNITY): Payer: Self-pay

## 2022-10-09 ENCOUNTER — Other Ambulatory Visit (HOSPITAL_COMMUNITY): Payer: Self-pay | Admitting: Psychiatry

## 2022-10-09 ENCOUNTER — Ambulatory Visit (INDEPENDENT_AMBULATORY_CARE_PROVIDER_SITE_OTHER): Payer: Medicaid Other

## 2022-10-09 VITALS — BP 122/78 | HR 96 | Ht 66.0 in | Wt 190.0 lb

## 2022-10-09 DIAGNOSIS — F411 Generalized anxiety disorder: Secondary | ICD-10-CM

## 2022-10-09 DIAGNOSIS — F319 Bipolar disorder, unspecified: Secondary | ICD-10-CM | POA: Diagnosis not present

## 2022-10-09 MED ORDER — HYDROXYZINE HCL 50 MG PO TABS: 50.0000 mg | ORAL_TABLET | Freq: Three times a day (TID) | ORAL | 3 refills | Status: DC | PRN

## 2022-10-09 NOTE — Telephone Encounter (Signed)
Patient informed Probation officer that she has been having increased anxiety. She notes that it worsens at night. Patient also reports that at times she wakes up in a panic due to having bad dreams. Today she is agreeable to increasing hydroxyzine 25 mg three times daily to 50 mg three times daily as needed to help manage anxiety. She will continue all other medications as prescribed. Patient also informed writer that her chest has been tight. She notes that her albuterol inhaler is not as effective as it once was. Provider recommended that she call her PCP to discuss other treatment options. No other concerns noted at this time.

## 2022-10-09 NOTE — Progress Notes (Signed)
  PATIENT ARRIVED FOR INJECTION -- ARIPiprazole ER (ABILIFY MAINTENA) 400 MG  TOLERATED WELL IN RIGHT-ARM .Marland Kitchen PLEASANT AS ALWAYS

## 2022-11-06 ENCOUNTER — Ambulatory Visit (HOSPITAL_COMMUNITY): Payer: Medicaid Other

## 2022-11-11 ENCOUNTER — Ambulatory Visit (INDEPENDENT_AMBULATORY_CARE_PROVIDER_SITE_OTHER): Payer: Medicaid Other

## 2022-11-11 ENCOUNTER — Encounter (HOSPITAL_COMMUNITY): Payer: Self-pay

## 2022-11-11 VITALS — BP 123/73 | HR 81 | Ht 66.0 in | Wt 190.0 lb

## 2022-11-11 DIAGNOSIS — F411 Generalized anxiety disorder: Secondary | ICD-10-CM

## 2022-11-11 DIAGNOSIS — F319 Bipolar disorder, unspecified: Secondary | ICD-10-CM

## 2022-11-11 NOTE — Progress Notes (Signed)
PATIENT ARRIVED FOR INJECTION -- ARIPiprazole ER (ABILIFY MAINTENA) 400 MG  TOLERATED WELL IN LEFT-ARM .Marland Kitchen PLEASANT AS ALWAYS

## 2022-11-25 ENCOUNTER — Encounter (HOSPITAL_COMMUNITY): Payer: Self-pay

## 2022-11-25 ENCOUNTER — Ambulatory Visit (INDEPENDENT_AMBULATORY_CARE_PROVIDER_SITE_OTHER): Payer: Medicaid Other | Admitting: Psychiatry

## 2022-11-25 DIAGNOSIS — F411 Generalized anxiety disorder: Secondary | ICD-10-CM

## 2022-11-25 DIAGNOSIS — F319 Bipolar disorder, unspecified: Secondary | ICD-10-CM | POA: Diagnosis not present

## 2022-11-25 MED ORDER — BENZTROPINE MESYLATE 0.5 MG PO TABS
0.5000 mg | ORAL_TABLET | Freq: Two times a day (BID) | ORAL | 3 refills | Status: DC
Start: 2022-11-25 — End: 2023-03-31

## 2022-11-25 MED ORDER — HYDROXYZINE HCL 50 MG PO TABS: 50.0000 mg | ORAL_TABLET | Freq: Three times a day (TID) | ORAL | 3 refills | Status: DC | PRN

## 2022-11-25 MED ORDER — HALOPERIDOL 5 MG PO TABS
5.0000 mg | ORAL_TABLET | Freq: Every evening | ORAL | 3 refills | Status: DC
Start: 2022-11-25 — End: 2023-03-31

## 2022-11-25 MED ORDER — ARIPIPRAZOLE ER 400 MG IM PRSY: 400.0000 mg | PREFILLED_SYRINGE | INTRAMUSCULAR | 3 refills | Status: DC

## 2022-11-25 MED ORDER — MIRTAZAPINE 30 MG PO TABS: 30.0000 mg | ORAL_TABLET | Freq: Every day | ORAL | 3 refills | Status: DC
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
## 2022-12-09 ENCOUNTER — Ambulatory Visit (INDEPENDENT_AMBULATORY_CARE_PROVIDER_SITE_OTHER): Payer: Medicaid Other

## 2022-12-09 ENCOUNTER — Encounter (HOSPITAL_COMMUNITY): Payer: Self-pay

## 2022-12-09 ENCOUNTER — Other Ambulatory Visit (HOSPITAL_COMMUNITY): Payer: Self-pay | Admitting: Psychiatry

## 2022-12-09 VITALS — BP 105/71 | HR 86 | Resp 16 | Ht 66.0 in | Wt 189.8 lb

## 2022-12-09 DIAGNOSIS — F319 Bipolar disorder, unspecified: Secondary | ICD-10-CM

## 2022-12-09 MED ORDER — ARIPIPRAZOLE ER 400 MG IM PRSY: 400.0000 mg | PREFILLED_SYRINGE | INTRAMUSCULAR | 11 refills | Status: DC

## 2022-12-09 NOTE — Progress Notes (Signed)
  PATIENT ARRIVED FOR INJECTION -- ARIPiprazole ER (ABILIFY MAINTENA) 400 MG  TOLERATED WELL IN RIGHT-ARM .. PLEASANT AS ALWAYS   

## 2023-01-06 ENCOUNTER — Ambulatory Visit (INDEPENDENT_AMBULATORY_CARE_PROVIDER_SITE_OTHER): Payer: Medicaid Other

## 2023-01-06 ENCOUNTER — Encounter (HOSPITAL_COMMUNITY): Payer: Self-pay

## 2023-01-06 VITALS — BP 114/75 | HR 85 | Ht 66.0 in | Wt 189.0 lb

## 2023-01-06 DIAGNOSIS — F200 Paranoid schizophrenia: Secondary | ICD-10-CM | POA: Diagnosis not present

## 2023-01-06 DIAGNOSIS — F319 Bipolar disorder, unspecified: Secondary | ICD-10-CM

## 2023-01-06 DIAGNOSIS — G4700 Insomnia, unspecified: Secondary | ICD-10-CM

## 2023-01-06 NOTE — Progress Notes (Signed)
PATIENT ARRIVED FOR INJECTION -- ARIPiprazole ER (ABILIFY MAINTENA) 400 MG  TOLERATED WELL IN LEFT-ARM .. PLEASANT AS ALWAYS 

## 2023-01-27 ENCOUNTER — Ambulatory Visit (HOSPITAL_COMMUNITY): Payer: Medicaid Other

## 2023-02-03 ENCOUNTER — Ambulatory Visit (INDEPENDENT_AMBULATORY_CARE_PROVIDER_SITE_OTHER): Payer: Medicaid Other | Admitting: *Deleted

## 2023-02-03 ENCOUNTER — Encounter (HOSPITAL_COMMUNITY): Payer: Self-pay

## 2023-02-03 VITALS — BP 124/75 | HR 85 | Resp 16 | Ht 66.0 in | Wt 189.4 lb

## 2023-02-03 DIAGNOSIS — F319 Bipolar disorder, unspecified: Secondary | ICD-10-CM | POA: Diagnosis not present

## 2023-02-03 NOTE — Progress Notes (Cosign Needed)
  PATIENT ARRIVED FOR INJECTION -- ARIPiprazole ER (ABILIFY MAINTENA) 400 MG  TOLERATED WELL IN RIGHT-ARM .. PLEASANT AS ALWAYS   

## 2023-03-04 ENCOUNTER — Other Ambulatory Visit (HOSPITAL_COMMUNITY): Payer: Self-pay | Admitting: Psychiatry

## 2023-03-04 DIAGNOSIS — F319 Bipolar disorder, unspecified: Secondary | ICD-10-CM

## 2023-03-04 MED ORDER — ARIPIPRAZOLE ER 400 MG IM PRSY: 400.0000 mg | PREFILLED_SYRINGE | INTRAMUSCULAR | 11 refills | Status: DC

## 2023-03-05 ENCOUNTER — Ambulatory Visit (HOSPITAL_COMMUNITY): Payer: MEDICAID

## 2023-03-05 ENCOUNTER — Ambulatory Visit (INDEPENDENT_AMBULATORY_CARE_PROVIDER_SITE_OTHER): Payer: MEDICAID | Admitting: Student

## 2023-03-05 ENCOUNTER — Encounter (HOSPITAL_COMMUNITY): Payer: Self-pay | Admitting: Student

## 2023-03-05 ENCOUNTER — Encounter (HOSPITAL_COMMUNITY): Payer: Self-pay

## 2023-03-05 VITALS — BP 106/75 | HR 73 | Temp 98.1°F | Ht 67.25 in | Wt 184.0 lb

## 2023-03-05 DIAGNOSIS — F1011 Alcohol abuse, in remission: Secondary | ICD-10-CM | POA: Diagnosis not present

## 2023-03-05 DIAGNOSIS — F411 Generalized anxiety disorder: Secondary | ICD-10-CM

## 2023-03-05 DIAGNOSIS — F319 Bipolar disorder, unspecified: Secondary | ICD-10-CM

## 2023-03-05 DIAGNOSIS — F1491 Cocaine use, unspecified, in remission: Secondary | ICD-10-CM | POA: Diagnosis not present

## 2023-03-05 DIAGNOSIS — F200 Paranoid schizophrenia: Secondary | ICD-10-CM

## 2023-03-05 MED ORDER — MIRTAZAPINE 15 MG PO TABS
15.0000 mg | ORAL_TABLET | Freq: Every day | ORAL | 1 refills | Status: DC
Start: 2023-03-05 — End: 2023-05-07

## 2023-03-05 NOTE — Progress Notes (Cosign Needed)
Patient arrived for injection of Abilify Maintena 400mg . Given in Left Deltoid without issue or complaint. Pleasant, cooperative with bright affect.
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
## 2023-03-06 ENCOUNTER — Encounter (HOSPITAL_COMMUNITY): Payer: Self-pay | Admitting: Student

## 2023-03-06 DIAGNOSIS — F319 Bipolar disorder, unspecified: Secondary | ICD-10-CM | POA: Insufficient documentation

## 2023-03-09 NOTE — Addendum Note (Signed)
Addended by: Theodoro Kos A on: 03/09/2023 02:15 PM   Modules accepted: Level of Service

## 2023-03-30 ENCOUNTER — Other Ambulatory Visit (HOSPITAL_COMMUNITY): Payer: Self-pay | Admitting: Psychiatry

## 2023-03-30 DIAGNOSIS — F319 Bipolar disorder, unspecified: Secondary | ICD-10-CM

## 2023-04-02 ENCOUNTER — Ambulatory Visit (HOSPITAL_COMMUNITY): Payer: Medicaid Other

## 2023-04-03 ENCOUNTER — Telehealth (HOSPITAL_COMMUNITY): Payer: Self-pay

## 2023-04-03 NOTE — Telephone Encounter (Signed)
Medication management - Message left for patient to follow up on her missed due injection 04/02/23 to request patient call back to get rescheduled for as soon as possible.

## 2023-04-08 ENCOUNTER — Encounter (HOSPITAL_COMMUNITY): Payer: Self-pay

## 2023-04-08 ENCOUNTER — Ambulatory Visit (INDEPENDENT_AMBULATORY_CARE_PROVIDER_SITE_OTHER): Payer: MEDICAID | Admitting: *Deleted

## 2023-04-08 VITALS — BP 138/77 | HR 90 | Resp 16 | Ht 67.25 in | Wt 186.8 lb

## 2023-04-08 DIAGNOSIS — F319 Bipolar disorder, unspecified: Secondary | ICD-10-CM | POA: Diagnosis not present

## 2023-04-08 NOTE — Progress Notes (Cosign Needed)
Patient arrived for injection of Abilify 400mg  given in Right Deltoid without issue or complaint. States medication is working well. No side effects. Pleasant and cooperative.

## 2023-04-10 ENCOUNTER — Encounter (HOSPITAL_COMMUNITY): Payer: MEDICAID | Admitting: Student

## 2023-04-29 ENCOUNTER — Other Ambulatory Visit (HOSPITAL_COMMUNITY): Payer: Self-pay | Admitting: Psychiatry

## 2023-04-29 DIAGNOSIS — F319 Bipolar disorder, unspecified: Secondary | ICD-10-CM

## 2023-05-07 ENCOUNTER — Ambulatory Visit (INDEPENDENT_AMBULATORY_CARE_PROVIDER_SITE_OTHER): Payer: MEDICAID | Admitting: Student

## 2023-05-07 ENCOUNTER — Ambulatory Visit (INDEPENDENT_AMBULATORY_CARE_PROVIDER_SITE_OTHER): Payer: MEDICAID

## 2023-05-07 ENCOUNTER — Encounter (HOSPITAL_COMMUNITY): Payer: Self-pay

## 2023-05-07 VITALS — BP 148/89 | HR 117 | Ht 67.0 in | Wt 185.2 lb

## 2023-05-07 DIAGNOSIS — F200 Paranoid schizophrenia: Secondary | ICD-10-CM | POA: Diagnosis not present

## 2023-05-07 DIAGNOSIS — F319 Bipolar disorder, unspecified: Secondary | ICD-10-CM | POA: Diagnosis not present

## 2023-05-07 DIAGNOSIS — Z79899 Other long term (current) drug therapy: Secondary | ICD-10-CM | POA: Diagnosis not present

## 2023-05-07 DIAGNOSIS — R29818 Other symptoms and signs involving the nervous system: Secondary | ICD-10-CM | POA: Diagnosis not present

## 2023-05-07 DIAGNOSIS — F411 Generalized anxiety disorder: Secondary | ICD-10-CM

## 2023-05-07 DIAGNOSIS — G4700 Insomnia, unspecified: Secondary | ICD-10-CM

## 2023-05-07 MED ORDER — ARIPIPRAZOLE 10 MG PO TABS: 10.0000 mg | ORAL_TABLET | Freq: Every day | ORAL | 2 refills | Status: DC

## 2023-05-07 MED ORDER — HYDROXYZINE HCL 25 MG PO TABS
25.0000 mg | ORAL_TABLET | Freq: Two times a day (BID) | ORAL | 3 refills | Status: AC | PRN
Start: 2023-05-07 — End: 2023-09-04

## 2023-05-07 MED ORDER — MIRTAZAPINE 15 MG PO TABS
15.0000 mg | ORAL_TABLET | Freq: Every day | ORAL | 2 refills | Status: DC
Start: 2023-05-07 — End: 2023-10-08
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
## 2023-05-07 NOTE — Progress Notes (Cosign Needed)
PATIENT ARRIVED FOR INJECTION -- ARIPiprazole ER (ABILIFY MAINTENA) 400 MG  TOLERATED WELL IN LEFT-ARM .. PLEASANT AS ALWAYS 

## 2023-05-13 ENCOUNTER — Other Ambulatory Visit (HOSPITAL_COMMUNITY): Payer: Medicaid Other

## 2023-05-19 DIAGNOSIS — Z79899 Other long term (current) drug therapy: Secondary | ICD-10-CM | POA: Insufficient documentation

## 2023-05-30 ENCOUNTER — Other Ambulatory Visit (HOSPITAL_COMMUNITY): Payer: Self-pay | Admitting: Psychiatry

## 2023-05-30 ENCOUNTER — Other Ambulatory Visit (HOSPITAL_COMMUNITY): Payer: Self-pay | Admitting: Student

## 2023-05-30 DIAGNOSIS — F319 Bipolar disorder, unspecified: Secondary | ICD-10-CM

## 2023-06-04 ENCOUNTER — Ambulatory Visit (INDEPENDENT_AMBULATORY_CARE_PROVIDER_SITE_OTHER): Payer: MEDICAID | Admitting: *Deleted

## 2023-06-04 ENCOUNTER — Encounter (HOSPITAL_COMMUNITY): Payer: MEDICAID | Admitting: Student

## 2023-06-04 ENCOUNTER — Encounter (HOSPITAL_COMMUNITY): Payer: Self-pay

## 2023-06-04 VITALS — BP 123/81 | HR 96 | Resp 16 | Ht 67.0 in | Wt 182.8 lb

## 2023-06-04 DIAGNOSIS — F319 Bipolar disorder, unspecified: Secondary | ICD-10-CM | POA: Diagnosis not present

## 2023-06-04 NOTE — Progress Notes (Signed)
Patient arrived for her injection of Abilify Maintena 400mg  that she provided from her pharmacy. States she has no side effects from medication and that it is working well for her. Pleasant, cooperative, smiling with appropriate affect. No SI/HI or AV hallucinations. Injection given in Right deltoid. Will return in 28 days.

## 2023-06-10 ENCOUNTER — Other Ambulatory Visit (HOSPITAL_COMMUNITY): Payer: MEDICAID

## 2023-06-12 ENCOUNTER — Other Ambulatory Visit (HOSPITAL_COMMUNITY): Payer: Self-pay | Admitting: Psychiatry

## 2023-06-12 DIAGNOSIS — F319 Bipolar disorder, unspecified: Secondary | ICD-10-CM

## 2023-06-15 ENCOUNTER — Other Ambulatory Visit (HOSPITAL_COMMUNITY): Payer: Medicaid Other

## 2023-06-22 ENCOUNTER — Other Ambulatory Visit (HOSPITAL_COMMUNITY): Payer: Medicaid Other

## 2023-07-01 ENCOUNTER — Ambulatory Visit (INDEPENDENT_AMBULATORY_CARE_PROVIDER_SITE_OTHER): Payer: MEDICAID

## 2023-07-01 ENCOUNTER — Other Ambulatory Visit: Payer: Self-pay

## 2023-07-01 ENCOUNTER — Encounter (HOSPITAL_COMMUNITY): Payer: Self-pay

## 2023-07-01 VITALS — BP 113/73 | HR 85 | Ht 66.0 in | Wt 179.0 lb

## 2023-07-01 DIAGNOSIS — F319 Bipolar disorder, unspecified: Secondary | ICD-10-CM | POA: Diagnosis not present

## 2023-07-01 NOTE — Progress Notes (Cosign Needed)
Patient presents today for her due injection of Abilify Maintena 400 mg. Patient presents well groomed and pleasant. Patient is looking forward to the holidays and is enjoying the cooler weather. Patient denies any SI/HI or AVH. Injection was prepared as ordered and administered in the patients Left Deltoid. Patient tolerated well and without complaint. Patient will return in 28 days for next due injection.    NDC: 16109-604-54 LOT: [id number] EXP: MAR 2027

## 2023-07-02 ENCOUNTER — Encounter (HOSPITAL_COMMUNITY): Payer: Medicaid Other | Admitting: Student

## 2023-07-22 ENCOUNTER — Other Ambulatory Visit (HOSPITAL_COMMUNITY): Payer: Self-pay | Admitting: Psychiatry

## 2023-07-22 DIAGNOSIS — F319 Bipolar disorder, unspecified: Secondary | ICD-10-CM

## 2023-07-29 ENCOUNTER — Ambulatory Visit (HOSPITAL_COMMUNITY): Payer: Medicaid Other

## 2023-08-05 ENCOUNTER — Ambulatory Visit (HOSPITAL_COMMUNITY): Payer: Medicaid Other

## 2023-08-12 ENCOUNTER — Encounter (HOSPITAL_COMMUNITY): Payer: Self-pay

## 2023-08-12 ENCOUNTER — Ambulatory Visit (INDEPENDENT_AMBULATORY_CARE_PROVIDER_SITE_OTHER): Payer: MEDICAID

## 2023-08-12 VITALS — BP 122/73 | HR 106 | Wt 178.4 lb

## 2023-08-12 DIAGNOSIS — G4700 Insomnia, unspecified: Secondary | ICD-10-CM

## 2023-08-12 DIAGNOSIS — F200 Paranoid schizophrenia: Secondary | ICD-10-CM | POA: Diagnosis not present

## 2023-08-12 DIAGNOSIS — F411 Generalized anxiety disorder: Secondary | ICD-10-CM

## 2023-08-12 NOTE — Progress Notes (Cosign Needed)
  PATIENT ARRIVED FOR INJECTION -- ARIPiprazole ER (ABILIFY MAINTENA) 400 MG  TOLERATED WELL IN RIGHT-ARM .. PLEASANT AS ALWAYS   

## 2023-08-31 ENCOUNTER — Other Ambulatory Visit (HOSPITAL_COMMUNITY): Payer: Self-pay | Admitting: Psychiatry

## 2023-08-31 DIAGNOSIS — F319 Bipolar disorder, unspecified: Secondary | ICD-10-CM

## 2023-09-09 ENCOUNTER — Ambulatory Visit (HOSPITAL_COMMUNITY): Payer: Medicaid Other | Admitting: Family

## 2023-09-09 ENCOUNTER — Encounter (HOSPITAL_COMMUNITY): Payer: Self-pay

## 2023-09-09 VITALS — BP 127/69 | HR 98 | Resp 16 | Ht 66.0 in | Wt 178.0 lb

## 2023-09-09 DIAGNOSIS — F319 Bipolar disorder, unspecified: Secondary | ICD-10-CM
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
## 2023-09-09 NOTE — Progress Notes (Cosign Needed)
Pt presents today for injection of Abilify Maintena 400MG  in left deltoid. Pt tolerated successfully with no complaints. Pt was very friendly and energetic states that she had an amazing New Years. Pt states she had no AVH, SI,or HI.

## 2023-09-30 ENCOUNTER — Other Ambulatory Visit (HOSPITAL_COMMUNITY): Payer: Self-pay | Admitting: Psychiatry

## 2023-09-30 DIAGNOSIS — F319 Bipolar disorder, unspecified: Secondary | ICD-10-CM

## 2023-10-01 ENCOUNTER — Telehealth (HOSPITAL_COMMUNITY): Payer: Self-pay | Admitting: *Deleted

## 2023-10-01 NOTE — Telephone Encounter (Signed)
 Patient called asking for refill of Remeron . No appointment scheduled except for injection clinic.

## 2023-10-02 ENCOUNTER — Telehealth (HOSPITAL_COMMUNITY): Payer: Self-pay | Admitting: *Deleted

## 2023-10-02 NOTE — Telephone Encounter (Signed)
 Fax received for PA of Abilify Maintena 400mg . Submitted online with cover my meds. Awaiting decision.

## 2023-10-05 NOTE — Telephone Encounter (Signed)
 Could she get rescheduled with me? I will send in enough until she is seen again (please let me know when that appointment date is scheduled), as I have not seen her since September. Otherwise, she could be seen during [facility] clinic and have a refill sent in by that provider.  Dr. Lorna Rose

## 2023-10-05 NOTE — Telephone Encounter (Signed)
 Medication management - Call with pharmacist at pt's [facility] to inform her Abilify  Maintena 400 mg prefilled Syringes were approved for every 28 days, good until 10/01/24.

## 2023-10-06 ENCOUNTER — Telehealth (HOSPITAL_COMMUNITY): Payer: Self-pay | Admitting: *Deleted

## 2023-10-06 NOTE — Telephone Encounter (Signed)
Fax received for PA approval of Abilify Maintena 400mg . Called to notify pharmacy.

## 2023-10-07 ENCOUNTER — Ambulatory Visit (HOSPITAL_COMMUNITY): Payer: Medicaid Other

## 2023-10-08 ENCOUNTER — Telehealth (HOSPITAL_COMMUNITY): Payer: Self-pay

## 2023-10-08 ENCOUNTER — Other Ambulatory Visit (HOSPITAL_COMMUNITY): Payer: Self-pay | Admitting: Psychiatry

## 2023-10-08 DIAGNOSIS — F411 Generalized anxiety disorder: Secondary | ICD-10-CM

## 2023-10-08 DIAGNOSIS — F319 Bipolar disorder, unspecified: Secondary | ICD-10-CM

## 2023-10-08 MED ORDER — MIRTAZAPINE 15 MG PO TABS: 15.0000 mg | ORAL_TABLET | Freq: Every day | ORAL | 3 refills | Status: DC

## 2023-10-08 NOTE — Telephone Encounter (Signed)
She only sees you during shot clinic     mirtazapine (REMERON) 15 MG tablet (Expired) 15 mg, Daily at bedtime         Summary: Take 1 tablet (15 mg total) by mouth at bedtime., Starting Thu 05/07/2023, Until Wed 08/05/2023, Normal Dose, Route, Frequency: 15 mg, Oral, Daily at bedtimeStart: 09/12/2024End: 12/11/2024Ord/Sold: 05/07/2023 (O)Ordered On: 09/12/2024Pharmacy: [id number] - [place] ([place]), [place] [address] Associated: Taking: Long-term: Med Note:        Change Patient Sig: Take 1 tablet (15 mg total) by mouth at bedtime. Ordering Department: [facility] Authorized By: Lamar Sprinkles, MD Dispense: 30 tablet

## 2023-10-08 NOTE — Telephone Encounter (Signed)
Medication sent to preferred pharmacy,

## 2023-10-14 ENCOUNTER — Encounter (HOSPITAL_COMMUNITY): Payer: Self-pay

## 2023-10-14 ENCOUNTER — Ambulatory Visit (HOSPITAL_COMMUNITY): Payer: MEDICAID

## 2023-10-21 ENCOUNTER — Ambulatory Visit (INDEPENDENT_AMBULATORY_CARE_PROVIDER_SITE_OTHER): Payer: MEDICAID

## 2023-10-21 VITALS — BP 122/75 | HR 79 | Ht 66.0 in | Wt 176.0 lb

## 2023-10-21 DIAGNOSIS — F319 Bipolar disorder, unspecified: Secondary | ICD-10-CM

## 2023-10-21 NOTE — Progress Notes (Cosign Needed)
 Pt presents today for injection of Abilify maintenna 400 mg. Injection was administered in right deltoid with no complaints.    Pt states that she is doing well and is in good spirits. Pt denies any AVH, SI, and HI. Pt states that she is felling a lot better and was excited to show staff the new growth of her nails. Pt states that she has no questions for provider at this time.

## 2023-11-03 ENCOUNTER — Other Ambulatory Visit (HOSPITAL_COMMUNITY): Payer: Self-pay | Admitting: Psychiatry

## 2023-11-03 DIAGNOSIS — F319 Bipolar disorder, unspecified: Secondary | ICD-10-CM

## 2023-11-18 ENCOUNTER — Ambulatory Visit (INDEPENDENT_AMBULATORY_CARE_PROVIDER_SITE_OTHER): Payer: MEDICAID

## 2023-11-18 VITALS — BP 121/78 | HR 90 | Ht 66.0 in | Wt 176.0 lb

## 2023-11-18 DIAGNOSIS — Z79899 Other long term (current) drug therapy: Secondary | ICD-10-CM | POA: Diagnosis not present

## 2023-11-18 DIAGNOSIS — F319 Bipolar disorder, unspecified: Secondary | ICD-10-CM

## 2023-11-18 NOTE — Progress Notes (Cosign Needed)
 Pt presents today for injection of Abilify maintenna 400 mg. Injection was administered in left deltoid with no complaints.     Pt states that she is doing well and is in good spirits. Pt denies any AVH, SI, and HI. Pt states that she is felling a lot better. Pt has no complaints for provider at this time.     JNL, CMA

## 2023-12-05 ENCOUNTER — Other Ambulatory Visit (HOSPITAL_COMMUNITY): Payer: Self-pay | Admitting: Psychiatry

## 2023-12-05 DIAGNOSIS — F319 Bipolar disorder, unspecified: Secondary | ICD-10-CM

## 2023-12-16 ENCOUNTER — Ambulatory Visit (INDEPENDENT_AMBULATORY_CARE_PROVIDER_SITE_OTHER): Payer: MEDICAID

## 2023-12-16 VITALS — BP 123/70 | HR 90 | Ht 66.0 in | Wt 173.0 lb

## 2023-12-16 DIAGNOSIS — F319 Bipolar disorder, unspecified: Secondary | ICD-10-CM

## 2023-12-16 NOTE — Progress Notes (Cosign Needed)
 Pt presents today for injection of Abilify  maintenna 400 mg. Injection was administered in right deltoid with no complaints.      Pt states that she is doing well and is in good spirits. Pt denies any AVH, SI, and HI. Pt states that she is felling a lot better, she notes that when her medicine runs out at the end along with her pills symptoms does come back. Pt has no complaints for provider at this time.

## 2024-01-09 ENCOUNTER — Other Ambulatory Visit (HOSPITAL_COMMUNITY): Payer: Self-pay | Admitting: Psychiatry

## 2024-01-09 DIAGNOSIS — F319 Bipolar disorder, unspecified: Secondary | ICD-10-CM

## 2024-01-13 ENCOUNTER — Ambulatory Visit (HOSPITAL_COMMUNITY): Payer: MEDICAID

## 2024-01-13 VITALS — BP 132/84 | HR 90 | Temp 98.1°F | Ht 66.0 in | Wt 170.0 lb

## 2024-01-13 DIAGNOSIS — F319 Bipolar disorder, unspecified: Secondary | ICD-10-CM

## 2024-01-13 NOTE — Progress Notes (Cosign Needed)
 Pt presents today for injection of Abilify  maintenna 400 mg. Injection was administered in left deltoid with no complaints.       Pt states that she is doing well and is in good spirits. Pt denies any AVH, SI, and HI. Pt states that she is felling a lot better, she notes that when her medicine runs out at the end along with her pills symptoms does come back. Pt has no complaints for provider at this time  JNL,CMA

## 2024-02-08 ENCOUNTER — Other Ambulatory Visit (HOSPITAL_COMMUNITY): Payer: Self-pay | Admitting: Psychiatry

## 2024-02-08 DIAGNOSIS — F319 Bipolar disorder, unspecified: Secondary | ICD-10-CM

## 2024-02-08 DIAGNOSIS — F411 Generalized anxiety disorder: Secondary | ICD-10-CM

## 2024-02-09 ENCOUNTER — Ambulatory Visit (HOSPITAL_COMMUNITY): Payer: MEDICAID

## 2024-02-10 ENCOUNTER — Ambulatory Visit (HOSPITAL_COMMUNITY): Payer: MEDICAID

## 2024-02-10 ENCOUNTER — Telehealth (HOSPITAL_COMMUNITY): Payer: Self-pay | Admitting: *Deleted

## 2024-02-10 ENCOUNTER — Other Ambulatory Visit (HOSPITAL_COMMUNITY): Payer: Self-pay | Admitting: Family

## 2024-02-10 DIAGNOSIS — F411 Generalized anxiety disorder: Secondary | ICD-10-CM

## 2024-02-10 DIAGNOSIS — F319 Bipolar disorder, unspecified: Secondary | ICD-10-CM

## 2024-02-10 MED ORDER — BENZTROPINE MESYLATE 0.5 MG PO TABS: 0.5000 mg | ORAL_TABLET | Freq: Two times a day (BID) | ORAL | 0 refills | Status: DC

## 2024-02-10 MED ORDER — MIRTAZAPINE 15 MG PO TABS: 15.0000 mg | ORAL_TABLET | Freq: Every day | ORAL | 0 refills | Status: DC

## 2024-02-10 NOTE — Telephone Encounter (Signed)
 Patient called for refill of Remeron  and cogentin 

## 2024-02-10 NOTE — Progress Notes (Signed)
 Mirtazapine  15mg  and Benztropine  0.5 mg was sent to the pharmacy for medication refill.

## 2024-02-18 ENCOUNTER — Ambulatory Visit (HOSPITAL_COMMUNITY): Payer: MEDICAID

## 2024-02-19 ENCOUNTER — Ambulatory Visit (HOSPITAL_COMMUNITY): Payer: MEDICAID

## 2024-02-19 ENCOUNTER — Ambulatory Visit (INDEPENDENT_AMBULATORY_CARE_PROVIDER_SITE_OTHER): Payer: MEDICAID

## 2024-02-19 VITALS — BP 126/81 | HR 67 | Temp 98.2°F | Ht 66.0 in | Wt 172.8 lb

## 2024-02-19 DIAGNOSIS — F319 Bipolar disorder, unspecified: Secondary | ICD-10-CM

## 2024-02-19 NOTE — Progress Notes (Cosign Needed)
 Pt presents today for injection of Abilify  maintenna 400 mg. Injection was administered in RIGHT- deltoid with no complaints.        Pt states that she is doing well and is in good spirits. Pt denies any AVH, SI, and HI. Pt states that she is felling a lot better, she notes that when her medicine runs out at the end along with her pills symptoms does come back. Pt has no complaints for provider at this time   JNL,CMA

## 2024-03-13 ENCOUNTER — Other Ambulatory Visit (HOSPITAL_COMMUNITY): Payer: Self-pay | Admitting: Psychiatry

## 2024-03-13 ENCOUNTER — Other Ambulatory Visit (HOSPITAL_COMMUNITY): Payer: Self-pay | Admitting: Family

## 2024-03-13 DIAGNOSIS — F319 Bipolar disorder, unspecified: Secondary | ICD-10-CM

## 2024-03-13 DIAGNOSIS — F411 Generalized anxiety disorder: Secondary | ICD-10-CM

## 2024-03-14 ENCOUNTER — Telehealth (HOSPITAL_COMMUNITY): Payer: Self-pay

## 2024-03-14 NOTE — Telephone Encounter (Signed)
 Patient called in requesting refills for her mirtazapine  (REMERON ) 15 MG tablet  benztropine  (COGENTIN ) 0.5 MG tablet.  Last appointment  was 02/19/24  Sent to over to provider for consideration

## 2024-03-16 ENCOUNTER — Other Ambulatory Visit (HOSPITAL_COMMUNITY): Payer: Self-pay | Admitting: Family

## 2024-03-16 ENCOUNTER — Other Ambulatory Visit (HOSPITAL_COMMUNITY): Payer: Self-pay | Admitting: Physician Assistant

## 2024-03-16 ENCOUNTER — Telehealth (HOSPITAL_COMMUNITY): Payer: Self-pay

## 2024-03-16 DIAGNOSIS — F319 Bipolar disorder, unspecified: Secondary | ICD-10-CM

## 2024-03-16 DIAGNOSIS — F411 Generalized anxiety disorder: Secondary | ICD-10-CM

## 2024-03-16 MED ORDER — ARIPIPRAZOLE ER 400 MG IM PRSY: 400.0000 mg | PREFILLED_SYRINGE | INTRAMUSCULAR | 11 refills | Status: DC

## 2024-03-16 MED ORDER — BENZTROPINE MESYLATE 0.5 MG PO TABS: 0.5000 mg | ORAL_TABLET | Freq: Two times a day (BID) | ORAL | 0 refills | Status: DC

## 2024-03-16 MED ORDER — MIRTAZAPINE 15 MG PO TABS: 15.0000 mg | ORAL_TABLET | Freq: Every day | ORAL | 0 refills | Status: DC

## 2024-03-16 NOTE — Telephone Encounter (Signed)
Message acknowledged and reviewed. Patient's medications to be e-prescribed to pharmacy of choice.

## 2024-03-16 NOTE — Progress Notes (Signed)
 Provider was contacted by Aundra A. Trudy, RN regarding patient's medication refill requests.  Patient's medications to be e-prescribed to pharmacy of choice.

## 2024-03-16 NOTE — Progress Notes (Signed)
 Abilify  Maintena was refilled.

## 2024-03-16 NOTE — Telephone Encounter (Signed)
 Patient called in needing her Abilify  Maintena 400mg  Injection refilled.  [id number] - [place] ([place]), [place] - [address]

## 2024-03-17 ENCOUNTER — Ambulatory Visit (INDEPENDENT_AMBULATORY_CARE_PROVIDER_SITE_OTHER): Payer: MEDICAID

## 2024-03-17 ENCOUNTER — Encounter (HOSPITAL_COMMUNITY): Payer: Self-pay

## 2024-03-17 VITALS — BP 117/68 | HR 81 | Wt 162.0 lb

## 2024-03-17 DIAGNOSIS — F200 Paranoid schizophrenia: Secondary | ICD-10-CM

## 2024-03-17 DIAGNOSIS — G4700 Insomnia, unspecified: Secondary | ICD-10-CM

## 2024-03-17 DIAGNOSIS — F411 Generalized anxiety disorder: Secondary | ICD-10-CM | POA: Diagnosis not present

## 2024-03-17 NOTE — Progress Notes (Signed)
 Pt presents today for injection of Abilify  maintenna 400 mg. Injection was administered in left deltoid with no complaints.

## 2024-04-11 ENCOUNTER — Other Ambulatory Visit (HOSPITAL_COMMUNITY): Payer: Self-pay | Admitting: Physician Assistant

## 2024-04-11 DIAGNOSIS — F411 Generalized anxiety disorder: Secondary | ICD-10-CM

## 2024-04-11 DIAGNOSIS — F319 Bipolar disorder, unspecified: Secondary | ICD-10-CM

## 2024-04-14 ENCOUNTER — Ambulatory Visit (INDEPENDENT_AMBULATORY_CARE_PROVIDER_SITE_OTHER): Payer: MEDICAID

## 2024-04-14 ENCOUNTER — Encounter (HOSPITAL_COMMUNITY): Payer: Self-pay

## 2024-04-14 ENCOUNTER — Ambulatory Visit (INDEPENDENT_AMBULATORY_CARE_PROVIDER_SITE_OTHER): Payer: MEDICAID | Admitting: Family

## 2024-04-14 VITALS — BP 117/76 | HR 75 | Ht 66.0 in | Wt 178.6 lb

## 2024-04-14 DIAGNOSIS — F319 Bipolar disorder, unspecified: Secondary | ICD-10-CM | POA: Diagnosis not present

## 2024-04-14 DIAGNOSIS — F411 Generalized anxiety disorder: Secondary | ICD-10-CM

## 2024-04-14 MED ORDER — ARIPIPRAZOLE ER 400 MG IM PRSY
400.0000 mg | PREFILLED_SYRINGE | INTRAMUSCULAR | Status: DC
  Administered 2024-04-14: 400 mg via INTRAMUSCULAR

## 2024-04-14 MED ORDER — ARIPIPRAZOLE ER 720 MG/2.4ML IM PRSY: 720.0000 mg | PREFILLED_SYRINGE | Freq: Once | INTRAMUSCULAR | Status: DC

## 2024-04-14 MED ORDER — BENZTROPINE MESYLATE 0.5 MG PO TABS: 0.5000 mg | ORAL_TABLET | Freq: Two times a day (BID) | ORAL | 0 refills | Status: DC

## 2024-04-14 MED ORDER — MIRTAZAPINE 15 MG PO TABS: 15.0000 mg | ORAL_TABLET | Freq: Every day | ORAL | 0 refills | Status: DC

## 2024-04-14 NOTE — Progress Notes (Cosign Needed)
 Patient presented to office for injection of Abilify  Maintena 400 mg. Patient received injection in right deltoid. Patient tolerated injection well. Patient had visit with provider. No complaints noted. Patient will return in 28 days.
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
## 2024-05-05 ENCOUNTER — Other Ambulatory Visit (HOSPITAL_COMMUNITY): Payer: Self-pay

## 2024-05-05 MED ORDER — ABILIFY ASIMTUFII 720 MG/2.4ML IM PRSY: 720.0000 mg | PREFILLED_SYRINGE | INTRAMUSCULAR | 0 refills | Status: DC

## 2024-05-05 MED ORDER — ABILIFY ASIMTUFII 720 MG/2.4ML IM PRSY: 720.0000 mg | PREFILLED_SYRINGE | INTRAMUSCULAR | 3 refills | Status: DC

## 2024-05-05 NOTE — Addendum Note (Signed)
 Addended by: EZZARD STACI SAILOR on: 05/05/2024 10:14 AM   Modules accepted: Orders

## 2024-05-08 ENCOUNTER — Other Ambulatory Visit (HOSPITAL_COMMUNITY): Payer: Self-pay | Admitting: Family

## 2024-05-08 DIAGNOSIS — F319 Bipolar disorder, unspecified: Secondary | ICD-10-CM

## 2024-05-08 DIAGNOSIS — F411 Generalized anxiety disorder: Secondary | ICD-10-CM

## 2024-05-12 ENCOUNTER — Telehealth (HOSPITAL_COMMUNITY): Payer: Self-pay

## 2024-05-12 ENCOUNTER — Other Ambulatory Visit (HOSPITAL_COMMUNITY): Payer: Self-pay | Admitting: Psychiatry

## 2024-05-12 ENCOUNTER — Encounter (HOSPITAL_COMMUNITY): Payer: Self-pay

## 2024-05-12 ENCOUNTER — Ambulatory Visit (INDEPENDENT_AMBULATORY_CARE_PROVIDER_SITE_OTHER): Payer: MEDICAID

## 2024-05-12 VITALS — BP 113/70 | HR 70 | Ht 66.0 in | Wt 178.0 lb

## 2024-05-12 DIAGNOSIS — F200 Paranoid schizophrenia: Secondary | ICD-10-CM

## 2024-05-12 DIAGNOSIS — F411 Generalized anxiety disorder: Secondary | ICD-10-CM

## 2024-05-12 DIAGNOSIS — F319 Bipolar disorder, unspecified: Secondary | ICD-10-CM | POA: Diagnosis not present

## 2024-05-12 MED ORDER — ARIPIPRAZOLE ER 720 MG/2.4ML IM PRSY
720.0000 mg | PREFILLED_SYRINGE | Freq: Once | INTRAMUSCULAR | Status: AC
  Administered 2024-05-12: 720 mg via INTRAMUSCULAR

## 2024-05-12 MED ORDER — MIRTAZAPINE 15 MG PO TABS: 15.0000 mg | ORAL_TABLET | Freq: Every day | ORAL | 3 refills | Status: DC

## 2024-05-12 MED ORDER — BENZTROPINE MESYLATE 0.5 MG PO TABS: 0.5000 mg | ORAL_TABLET | Freq: Two times a day (BID) | ORAL | 3 refills | Status: DC

## 2024-05-12 NOTE — Progress Notes (Cosign Needed)
 Patient presented to office for injection of Abilify  720 mg. Patient was educated that injection is best given in the gluteus. Per patient request injection was given in left deltoid. Patient tolerated injection well. Patient denies SI/HI/ AVH. Pt request refills on her medications. No additional complaints noted. Patient will return in 2 months.

## 2024-05-12 NOTE — Telephone Encounter (Signed)
 Patient has requested a refill of Cogentin  0.5 mg and Remeron  15 mg during her visit.

## 2024-07-07 ENCOUNTER — Ambulatory Visit (HOSPITAL_COMMUNITY): Payer: MEDICAID

## 2024-07-13 ENCOUNTER — Ambulatory Visit (INDEPENDENT_AMBULATORY_CARE_PROVIDER_SITE_OTHER): Payer: MEDICAID | Admitting: Family

## 2024-07-13 ENCOUNTER — Encounter (HOSPITAL_COMMUNITY): Payer: Self-pay | Admitting: Family

## 2024-07-13 ENCOUNTER — Ambulatory Visit (INDEPENDENT_AMBULATORY_CARE_PROVIDER_SITE_OTHER): Payer: MEDICAID

## 2024-07-13 ENCOUNTER — Encounter (HOSPITAL_COMMUNITY): Payer: Self-pay

## 2024-07-13 VITALS — BP 124/68 | HR 103 | Resp 19 | Ht 66.0 in | Wt 185.4 lb

## 2024-07-13 DIAGNOSIS — F319 Bipolar disorder, unspecified: Secondary | ICD-10-CM

## 2024-07-13 DIAGNOSIS — Z79899 Other long term (current) drug therapy: Secondary | ICD-10-CM | POA: Diagnosis not present

## 2024-07-13 MED ORDER — ARIPIPRAZOLE ER 720 MG/2.4ML IM PRSY
720.0000 mg | PREFILLED_SYRINGE | INTRAMUSCULAR | Status: AC
  Administered 2024-07-13 – 2024-09-15 (×2): 720 mg via INTRAMUSCULAR
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
## 2024-07-13 NOTE — Progress Notes (Cosign Needed)
 Patient presented to office for injection of Asimtufii 720mg . Patient assessed by shot clinic provider T. Lewis, NP, prior to injection. Patient recieved injection in right deltoid per pt request. Pt was educated on proper placemnet of injection. Pt refused gluteal injection. Pateint tolerated injection well. no complaints noted. pt will return in 2 months. Pa

## 2024-09-08 ENCOUNTER — Other Ambulatory Visit (HOSPITAL_COMMUNITY): Payer: Self-pay | Admitting: Psychiatry

## 2024-09-08 DIAGNOSIS — F411 Generalized anxiety disorder: Secondary | ICD-10-CM

## 2024-09-08 DIAGNOSIS — F319 Bipolar disorder, unspecified: Secondary | ICD-10-CM

## 2024-09-13 ENCOUNTER — Ambulatory Visit (HOSPITAL_COMMUNITY): Payer: MEDICAID

## 2024-09-15 ENCOUNTER — Encounter (HOSPITAL_COMMUNITY): Payer: Self-pay

## 2024-09-15 ENCOUNTER — Ambulatory Visit (INDEPENDENT_AMBULATORY_CARE_PROVIDER_SITE_OTHER): Payer: MEDICAID | Admitting: Psychiatry

## 2024-09-15 ENCOUNTER — Ambulatory Visit (HOSPITAL_COMMUNITY): Payer: MEDICAID

## 2024-09-15 VITALS — BP 117/66 | HR 89 | Wt 182.6 lb

## 2024-09-15 DIAGNOSIS — F411 Generalized anxiety disorder: Secondary | ICD-10-CM

## 2024-09-15 DIAGNOSIS — F319 Bipolar disorder, unspecified: Secondary | ICD-10-CM

## 2024-09-15 DIAGNOSIS — F200 Paranoid schizophrenia: Secondary | ICD-10-CM

## 2024-09-15 MED ORDER — ABILIFY ASIMTUFII 720 MG/2.4ML IM PRSY: 720.0000 mg | PREFILLED_SYRINGE | INTRAMUSCULAR | 11 refills | Status: AC

## 2024-09-15 MED ORDER — MIRTAZAPINE 15 MG PO TABS: 15.0000 mg | ORAL_TABLET | Freq: Every day | ORAL | 1 refills | Status: AC

## 2024-09-15 MED ORDER — BENZTROPINE MESYLATE 0.5 MG PO TABS: 0.5000 mg | ORAL_TABLET | Freq: Two times a day (BID) | ORAL | 1 refills | Status: AC

## 2024-09-15 NOTE — Progress Notes (Signed)
 Patient presented to office for injection of Asimtufii 720mg . Patient assessed by shot clinic provider T. Lewis, NP, prior to injection. Patient recieved injection in Left deltoid per pt request. Pt was educated on proper placemnet of injection. Pt refused gluteal injection. Pateint tolerated injection well. no complaints noted. pt will return in 2 months. Pa
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
## 2024-11-15 ENCOUNTER — Ambulatory Visit (HOSPITAL_COMMUNITY): Payer: MEDICAID
